# Patient Record
Sex: Female | Born: 1935
Health system: Southern US, Community
[De-identification: ages and names within clinical notes are randomized; demographics above are authoritative.]

## PROBLEM LIST (undated history)

## (undated) DIAGNOSIS — M5416 Radiculopathy, lumbar region: Secondary | ICD-10-CM

## (undated) DIAGNOSIS — H8110 Benign paroxysmal vertigo, unspecified ear: Secondary | ICD-10-CM

## (undated) DIAGNOSIS — E785 Hyperlipidemia, unspecified: Secondary | ICD-10-CM

## (undated) DIAGNOSIS — I341 Nonrheumatic mitral (valve) prolapse: Secondary | ICD-10-CM

## (undated) DIAGNOSIS — E039 Hypothyroidism, unspecified: Secondary | ICD-10-CM

## (undated) DIAGNOSIS — Z8669 Personal history of other diseases of the nervous system and sense organs: Secondary | ICD-10-CM

## (undated) DIAGNOSIS — S83006A Unspecified dislocation of unspecified patella, initial encounter: Secondary | ICD-10-CM

## (undated) DIAGNOSIS — S62109A Fracture of unspecified carpal bone, unspecified wrist, initial encounter for closed fracture: Secondary | ICD-10-CM

## (undated) DIAGNOSIS — K5790 Diverticulosis of intestine, part unspecified, without perforation or abscess without bleeding: Secondary | ICD-10-CM

## (undated) DIAGNOSIS — K219 Gastro-esophageal reflux disease without esophagitis: Secondary | ICD-10-CM

## (undated) DIAGNOSIS — Z950 Presence of cardiac pacemaker: Secondary | ICD-10-CM

## (undated) DIAGNOSIS — M5136 Other intervertebral disc degeneration, lumbar region: Secondary | ICD-10-CM

## (undated) DIAGNOSIS — M810 Age-related osteoporosis without current pathological fracture: Secondary | ICD-10-CM

## (undated) DIAGNOSIS — B029 Zoster without complications: Secondary | ICD-10-CM

## (undated) DIAGNOSIS — S92909A Unspecified fracture of unspecified foot, initial encounter for closed fracture: Secondary | ICD-10-CM

## (undated) DIAGNOSIS — I495 Sick sinus syndrome: Secondary | ICD-10-CM

## (undated) DIAGNOSIS — H269 Unspecified cataract: Secondary | ICD-10-CM

## (undated) DIAGNOSIS — M51369 Other intervertebral disc degeneration, lumbar region without mention of lumbar back pain or lower extremity pain: Secondary | ICD-10-CM

## (undated) DIAGNOSIS — K649 Unspecified hemorrhoids: Secondary | ICD-10-CM

## (undated) HISTORY — DX: Personal history of other diseases of the nervous system and sense organs: Z86.69

## (undated) HISTORY — DX: Benign paroxysmal vertigo, unspecified ear: H81.10

## (undated) HISTORY — DX: Zoster without complications: B02.9

## (undated) HISTORY — DX: Hypothyroidism, unspecified: E03.9

## (undated) HISTORY — DX: Unspecified fracture of unspecified foot, initial encounter for closed fracture: S92.909A

## (undated) HISTORY — PX: TOTAL HIP ARTHROPLASTY: SHX124

## (undated) HISTORY — DX: Gastro-esophageal reflux disease without esophagitis: K21.9

## (undated) HISTORY — DX: Hyperlipidemia, unspecified: E78.5

## (undated) HISTORY — PX: PACEMAKER INSERTION: SHX728

## (undated) HISTORY — DX: Unspecified hemorrhoids: K64.9

## (undated) HISTORY — DX: Unspecified cataract: H26.9

## (undated) HISTORY — DX: Age-related osteoporosis without current pathological fracture: M81.0

## (undated) HISTORY — DX: Sick sinus syndrome: I49.5

## (undated) HISTORY — DX: Diverticulosis of intestine, part unspecified, without perforation or abscess without bleeding: K57.90

## (undated) HISTORY — PX: HEMORROIDECTOMY: SUR656

## (undated) HISTORY — DX: Nonrheumatic mitral (valve) prolapse: I34.1

## (undated) HISTORY — DX: Fracture of unspecified carpal bone, unspecified wrist, initial encounter for closed fracture: S62.109A

## (undated) HISTORY — DX: Presence of cardiac pacemaker: Z95.0

---

## 1997-07-28 ENCOUNTER — Other Ambulatory Visit: Admission: RE | Admit: 1997-07-28 | Discharge: 1997-07-28 | Payer: Self-pay | Admitting: Cardiology

## 1997-07-30 ENCOUNTER — Ambulatory Visit (HOSPITAL_COMMUNITY): Admission: RE | Admit: 1997-07-30 | Discharge: 1997-07-31 | Payer: Self-pay | Admitting: Cardiology

## 1999-01-13 ENCOUNTER — Other Ambulatory Visit: Admission: RE | Admit: 1999-01-13 | Discharge: 1999-01-13 | Payer: Self-pay | Admitting: Cardiology

## 1999-11-27 ENCOUNTER — Emergency Department (HOSPITAL_COMMUNITY): Admission: EM | Admit: 1999-11-27 | Discharge: 1999-11-27 | Payer: Self-pay | Admitting: Emergency Medicine

## 2000-01-23 ENCOUNTER — Other Ambulatory Visit: Admission: RE | Admit: 2000-01-23 | Discharge: 2000-01-23 | Payer: Self-pay | Admitting: Internal Medicine

## 2002-04-09 ENCOUNTER — Encounter: Payer: Self-pay | Admitting: Orthopedic Surgery

## 2002-04-15 ENCOUNTER — Encounter: Payer: Self-pay | Admitting: Orthopedic Surgery

## 2002-04-15 ENCOUNTER — Inpatient Hospital Stay (HOSPITAL_COMMUNITY): Admission: RE | Admit: 2002-04-15 | Discharge: 2002-04-18 | Payer: Self-pay | Admitting: Orthopedic Surgery

## 2003-09-17 ENCOUNTER — Ambulatory Visit (HOSPITAL_COMMUNITY): Admission: RE | Admit: 2003-09-17 | Discharge: 2003-09-18 | Payer: Self-pay | Admitting: Cardiology

## 2003-09-19 ENCOUNTER — Emergency Department (HOSPITAL_COMMUNITY): Admission: EM | Admit: 2003-09-19 | Discharge: 2003-09-19 | Payer: Self-pay | Admitting: *Deleted

## 2004-01-26 ENCOUNTER — Ambulatory Visit (HOSPITAL_COMMUNITY): Admission: RE | Admit: 2004-01-26 | Discharge: 2004-01-26 | Payer: Self-pay | Admitting: Internal Medicine

## 2004-11-13 ENCOUNTER — Ambulatory Visit (HOSPITAL_COMMUNITY): Admission: RE | Admit: 2004-11-13 | Discharge: 2004-11-13 | Payer: Self-pay | Admitting: Internal Medicine

## 2004-11-13 IMAGING — CT CT CHEST W/ CM
2 of 5 series · 15 of 36 positions shown, 18 images · IV contrast (omnipaque)
Comparison: none

CLINICAL DATA: History of lung nodules.  
CHEST CT WITH CONTRAST:
TECHNIQUE: Multidetector CT imaging of the chest was performed following the standard protocol during bolus administration of intravenous contrast.
Contrast:  100 cc Omnipaque 300

[Series 4: thin chest 2.0 b40f st · axial · 0.56mm/px · z∈[-327,-38]mm · 12 of 456 slices shown, 15 images]
[im 22/456  mediastinal]
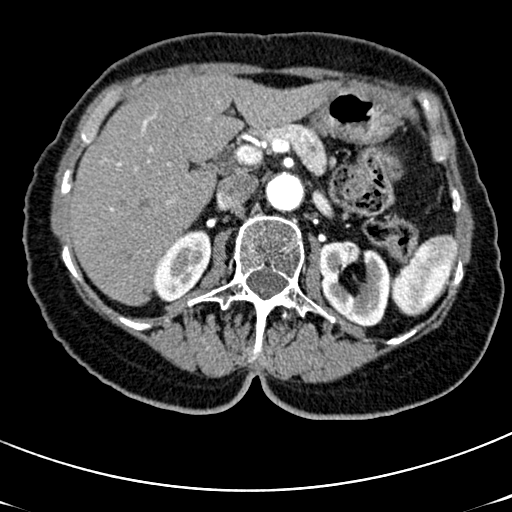
[im 22/456  lung]
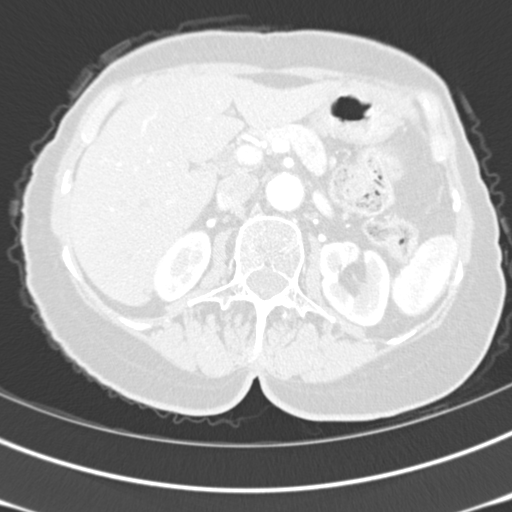
[im 66/456  lung]
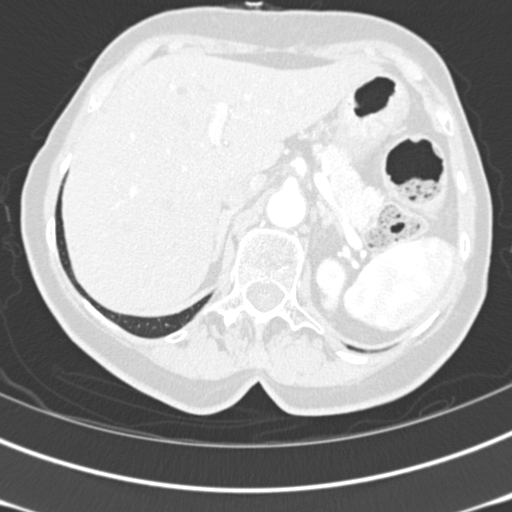
[im 109/456  lung]
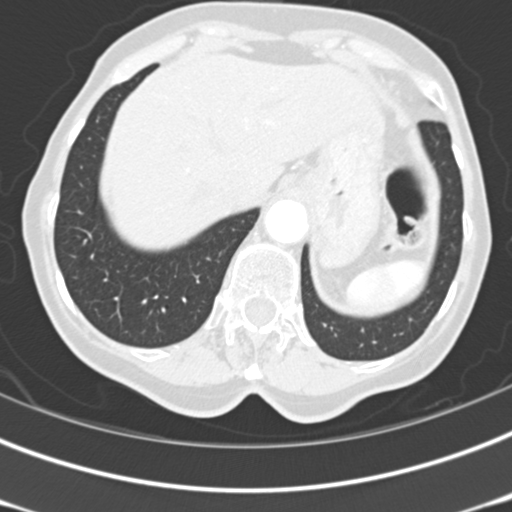
[im 131/456  lung]
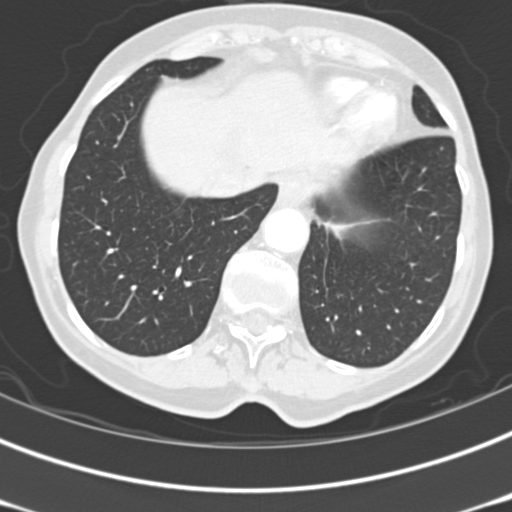
[im 174/456  mediastinal]
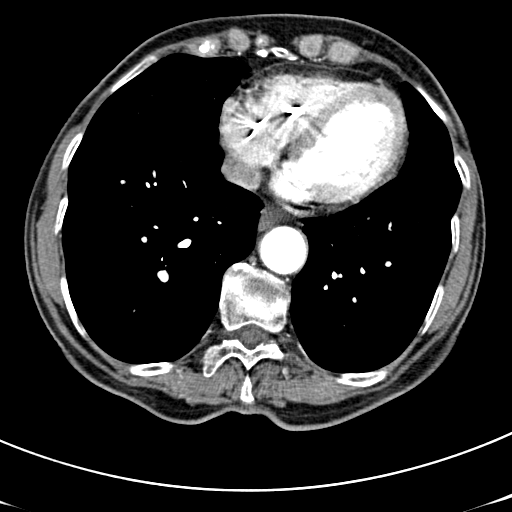
[im 174/456  lung]
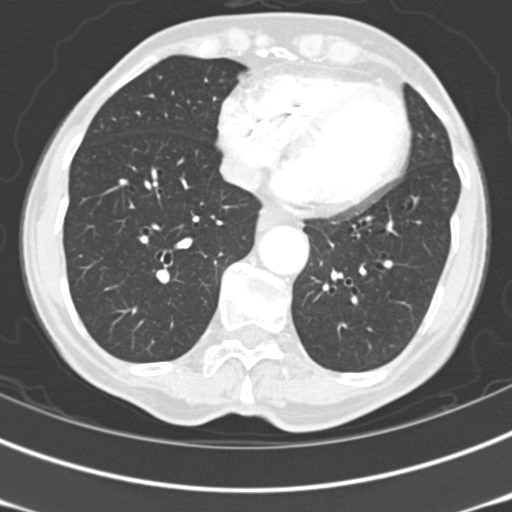
[im 217/456  lung]
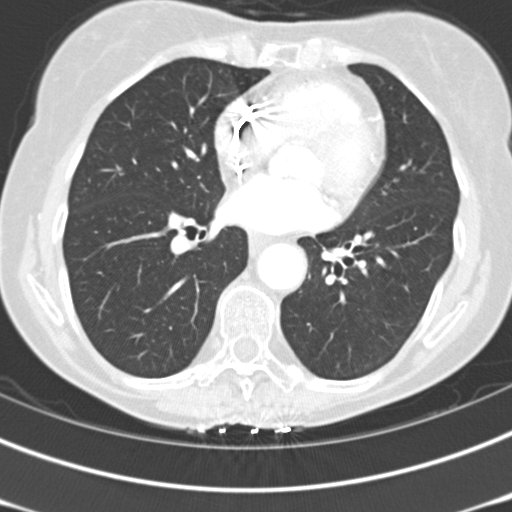
[im 239/456  lung]
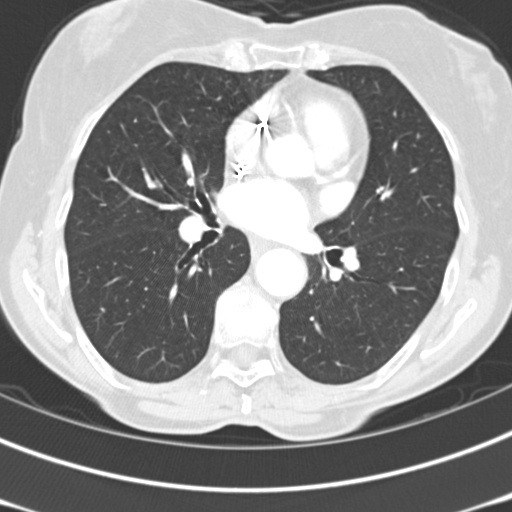
[im 282/456  lung]
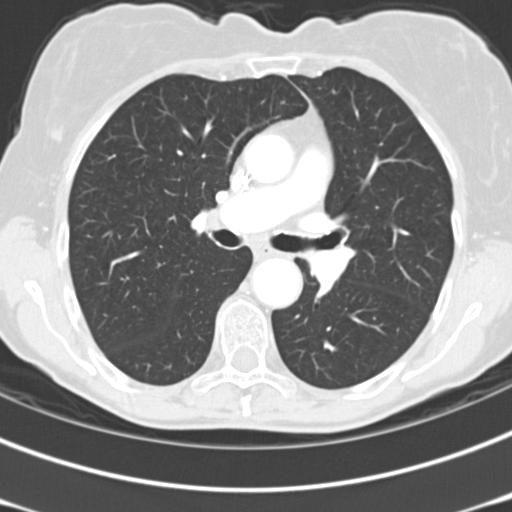
[im 326/456  mediastinal]
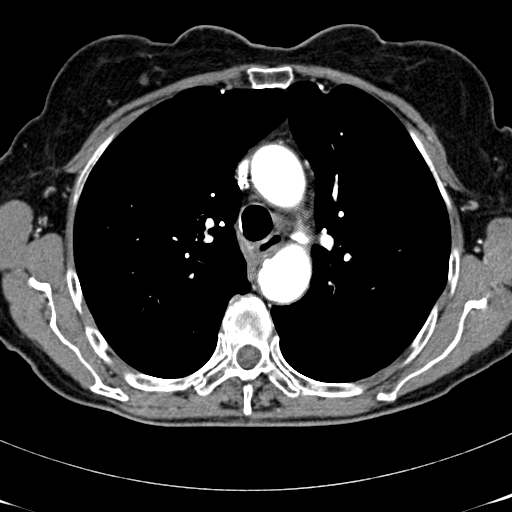
[im 326/456  lung]
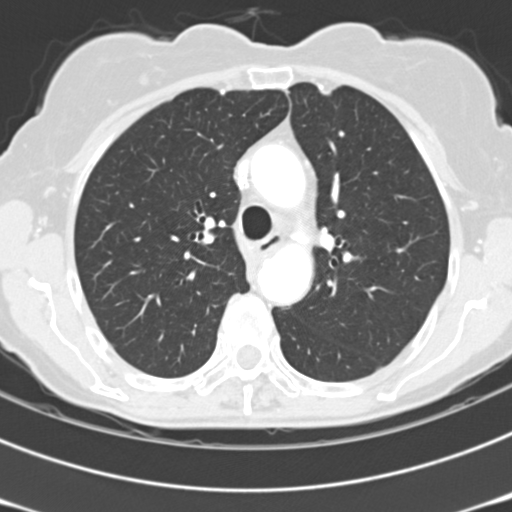
[im 347/456  lung]
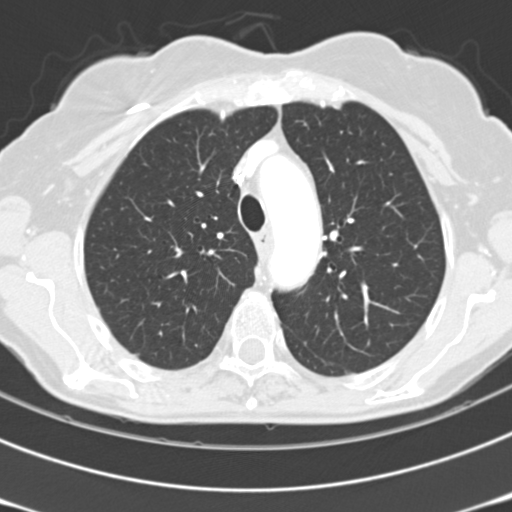
[im 391/456  lung]
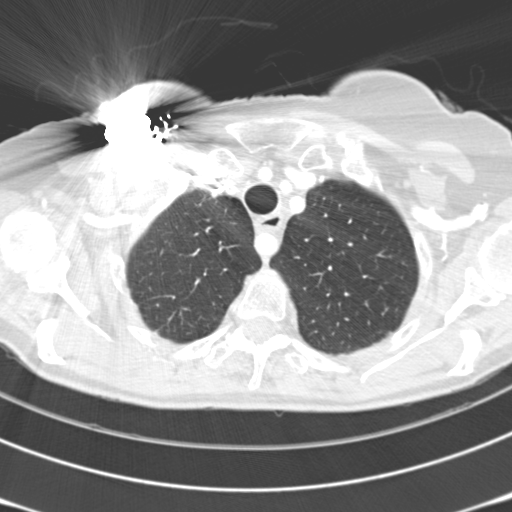
[im 434/456  lung]
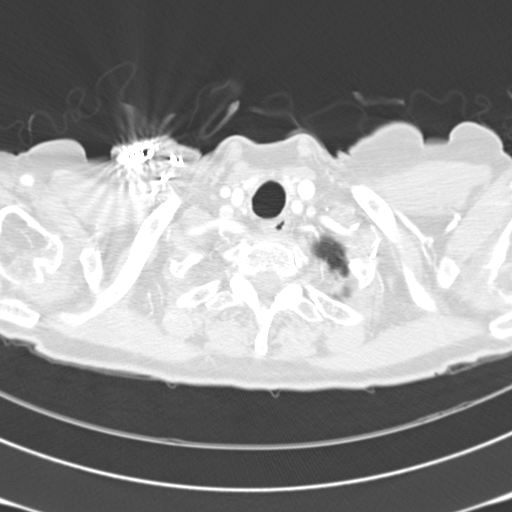

[Series 602: coronal · coronal · 0.62mm/px · 3 of 39 slices shown]
[im 8/39  lung]
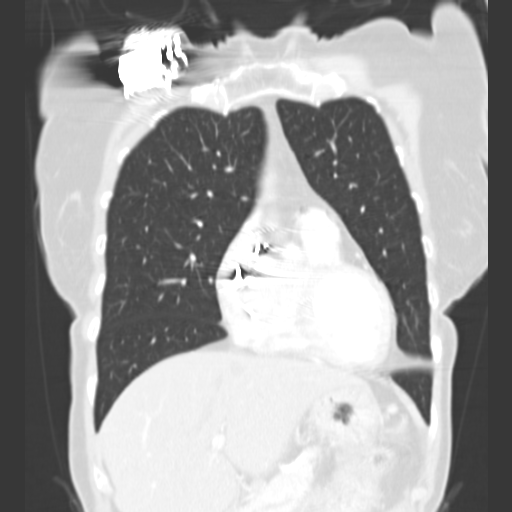
[im 16/39  lung]
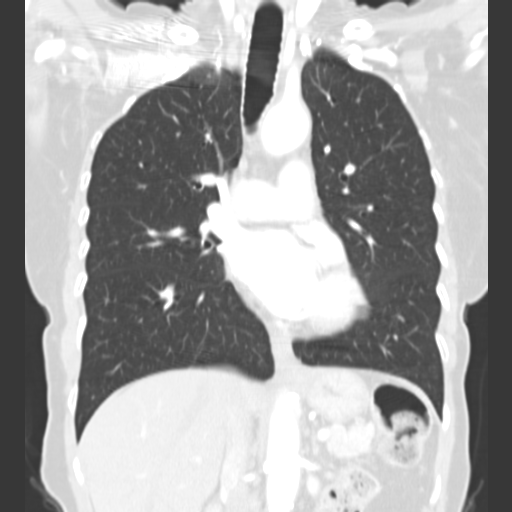
[im 23/39  lung]
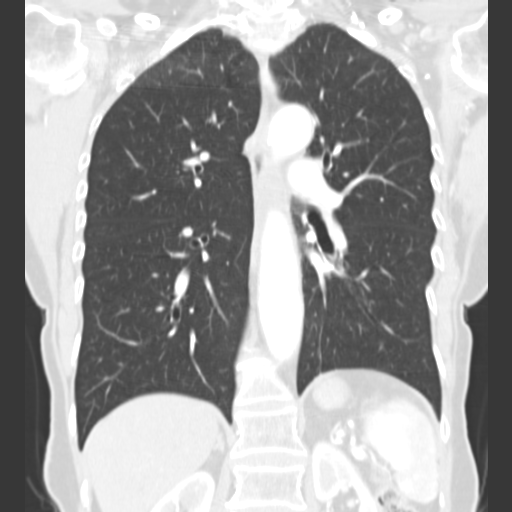

[15 of 36 positions shown; findings below may reference images not displayed]

FINDINGS: Again appreciated is left aortic arch with aberrant right subclavian artery coursing posterior to the esophagus.  Three lead pacemaker is again noted.  Again appreciated is an approximately 6.9 mm nodule in the apicoposterior segment of the right upper lobe. 
Stable small hepatic cysts medial segment left hepatic lobe.
IMPRESSION: Stable small noncalcified right upper lobe nodule.  Recommend continued followup to document stability over two years.  Consider non-IV contrast CT chest scan in six months.

## 2005-07-03 ENCOUNTER — Encounter: Admission: RE | Admit: 2005-07-03 | Discharge: 2005-07-03 | Payer: Self-pay | Admitting: Orthopedic Surgery

## 2005-07-10 HISTORY — PX: CARDIOVASCULAR STRESS TEST: SHX262

## 2005-07-12 ENCOUNTER — Inpatient Hospital Stay (HOSPITAL_COMMUNITY): Admission: RE | Admit: 2005-07-12 | Discharge: 2005-07-13 | Payer: Self-pay | Admitting: Orthopedic Surgery

## 2006-04-12 ENCOUNTER — Ambulatory Visit (HOSPITAL_COMMUNITY): Admission: RE | Admit: 2006-04-12 | Discharge: 2006-04-12 | Payer: Self-pay | Admitting: Internal Medicine

## 2007-06-17 ENCOUNTER — Encounter: Admission: RE | Admit: 2007-06-17 | Discharge: 2007-06-17 | Payer: Self-pay | Admitting: Otolaryngology

## 2007-10-22 ENCOUNTER — Ambulatory Visit (HOSPITAL_COMMUNITY): Admission: RE | Admit: 2007-10-22 | Discharge: 2007-10-22 | Payer: Self-pay | Admitting: Internal Medicine

## 2008-09-27 ENCOUNTER — Encounter: Admission: RE | Admit: 2008-09-27 | Discharge: 2008-09-27 | Payer: Self-pay | Admitting: Internal Medicine

## 2008-11-17 ENCOUNTER — Ambulatory Visit (HOSPITAL_COMMUNITY): Admission: RE | Admit: 2008-11-17 | Discharge: 2008-11-17 | Payer: Self-pay | Admitting: Internal Medicine

## 2008-11-25 HISTORY — PX: US ECHOCARDIOGRAPHY: HXRAD669

## 2009-01-15 DIAGNOSIS — S83006A Unspecified dislocation of unspecified patella, initial encounter: Secondary | ICD-10-CM

## 2009-01-15 HISTORY — DX: Unspecified dislocation of unspecified patella, initial encounter: S83.006A

## 2009-03-29 DIAGNOSIS — K219 Gastro-esophageal reflux disease without esophagitis: Secondary | ICD-10-CM | POA: Insufficient documentation

## 2009-03-29 DIAGNOSIS — M810 Age-related osteoporosis without current pathological fracture: Secondary | ICD-10-CM | POA: Insufficient documentation

## 2009-03-29 DIAGNOSIS — I059 Rheumatic mitral valve disease, unspecified: Secondary | ICD-10-CM | POA: Insufficient documentation

## 2009-03-29 DIAGNOSIS — G43909 Migraine, unspecified, not intractable, without status migrainosus: Secondary | ICD-10-CM | POA: Insufficient documentation

## 2009-03-29 DIAGNOSIS — M1610 Unilateral primary osteoarthritis, unspecified hip: Secondary | ICD-10-CM | POA: Insufficient documentation

## 2009-05-19 DIAGNOSIS — I700 Atherosclerosis of aorta: Secondary | ICD-10-CM | POA: Insufficient documentation

## 2009-09-11 ENCOUNTER — Ambulatory Visit: Payer: Self-pay | Admitting: Cardiology

## 2009-11-08 ENCOUNTER — Encounter: Admission: RE | Admit: 2009-11-08 | Discharge: 2009-11-08 | Payer: Self-pay | Admitting: Orthopedic Surgery

## 2009-11-19 ENCOUNTER — Encounter: Payer: Self-pay | Admitting: Internal Medicine

## 2009-12-08 ENCOUNTER — Ambulatory Visit: Payer: Self-pay | Admitting: Internal Medicine

## 2009-12-28 ENCOUNTER — Encounter: Payer: Self-pay | Admitting: Internal Medicine

## 2010-02-16 NOTE — Letter (Signed)
Summary: Remote Device Check  [employer], Main Office  [address]   [place], [place] [address]   Phone: [phone]  Fax: [phone]     December 28, 2009 MRN: [id number]   [facility] [address] [place], [place]  [address]   Dear Ms. Lumbert,   Your remote transmission was recieved and reviewed by your physician.  All diagnostics were within normal limits for you.   ___X___Your next office visit is scheduled for:   02-24-10 AT 910 with Dr [name].     Sincerely,  Vella Kohler

## 2010-02-16 NOTE — Miscellaneous (Signed)
Summary: Device preload  Clinical Lists Changes  Observations: Added new observation of PPM INDICATN: Sick sinus syndrome (11/19/2009 13:18) Added new observation of MAGNET RTE: BOL 85 ERI 65 (11/19/2009 13:18) Added new observation of PPMLEADSTAT2: active (11/19/2009 13:18) Added new observation of PPMLEADSER2: 034742  (11/19/2009 13:18) Added new observation of PPMLEADMOD2: 4470  (11/19/2009 13:18) Added new observation of PPMLEADDOI2: 07/24/1997  (11/19/2009 13:18) Added new observation of PPMLEADLOC2: RV  (11/19/2009 13:18) Added new observation of PPMLEADSTAT1: active  (11/19/2009 13:18) Added new observation of PPMLEADSER1: 595638  (11/19/2009 13:18) Added new observation of PPMLEADMOD1: 4469  (11/19/2009 13:18) Added new observation of PPMLEADDOI1: 09/17/2003  (11/19/2009 13:18) Added new observation of PPMLEADLOC1: RA  (11/19/2009 13:18) Added new observation of PPM DOI: 09/17/2003  (11/19/2009 13:18) Added new observation of PPM SERL#: [id number]  (11/19/2009 13:18) Added new observation of PPM MODL#: [id number]  (11/19/2009 60:63) Added new observation of PACEMAKERMFG: Medtronic  (11/19/2009 13:18) Added new observation of PPM IMP MD: Duffy Rhody Tennant,MD  (11/19/2009 13:18) Added new observation of PPM REFER MD: Rolla Plate  (11/19/2009 13:18) Added new observation of PACEMAKER MD: Hillis Range, MD  (11/19/2009 13:18)      PPM Specifications Following MD:  Hillis Range, MD     Referring MD:  Rolla Plate PPM Vendor:  Medtronic     PPM Model Number:  [id number]     PPM Serial Number:  [id number] PPM DOI:  09/17/2003     PPM Implanting MD:  Rolla Plate  Lead 1    Location: RA     DOI: 09/17/2003     Model #: [id number]     Serial #: [id number]     Status: active Lead 2    Location: RV     DOI: 07/24/1997     Model #: 4470     Serial #: [id number]     Status: active  Magnet Response Rate:  BOL 85 ERI 65  Indications:  Sick sinus syndrome

## 2010-02-16 NOTE — Cardiovascular Report (Signed)
Summary: Office Visit Remote   Office Visit Remote   Imported By: Roderic Ovens 12/29/2009 14:09:11  _____________________________________________________________________  External Attachment:    Type:   Image     Comment:   External Document

## 2010-02-24 ENCOUNTER — Encounter: Payer: Self-pay | Admitting: Internal Medicine

## 2010-02-24 ENCOUNTER — Encounter (INDEPENDENT_AMBULATORY_CARE_PROVIDER_SITE_OTHER): Payer: 59 | Admitting: Internal Medicine

## 2010-02-24 DIAGNOSIS — I495 Sick sinus syndrome: Secondary | ICD-10-CM

## 2010-03-02 NOTE — Assessment & Plan Note (Signed)
Summary: pacer check/mdt/gso card pt moved from bumplist/mt/kwb   Visit Type:  Initial Consult Primary Provider:  Rodrigo Ran MD   History of Present Illness: [name] is an 77 yo WF with a h/o sinus node dysfunction s/p PPM 1990s (in [place]) for symptomatic bradycardia.  She had atrial lead revision and generator change by Dr Deborah Chalk in 2005.  She has done very well from a pacemaker standpoint.  She denies CP, SOB, palpitations, dizziness, presyncope, or syncope.  Unfortunately, she tripped over a curb 10/11 and fractured several bones in her R foot and L knee cap.  She required extended stay at [facility] center.  She feels that she continues to make progress.  She is otherwise without complaint today.  Current Medications (verified): 1)  Synthroid 75 Mcg Tabs (Levothyroxine Sodium) .... Once Daily 2)  Aspirin 81 Mg Tbec (Aspirin) .... Take One Tablet By Mouth Daily 3)  Multivitamins   Tabs (Multiple Vitamin) .... Once Daily 4)  Calcium 600 600 Mg Tabs (Calcium Carbonate) .... 2 Daily 5)  Vitamin D 1000 Unit Tabs (Cholecalciferol) .... Once Daily 6)  Pravastatin Sodium 10 Mg Tabs (Pravastatin Sodium) .... Take One Tablet By Mouth Daily At Bedtime 7)  Fish Oil   Oil (Fish Oil) .... Once Daily  Allergies (verified): No Known Drug Allergies  Past History:  Past Medical History: sinus node dysfunction s/p PPM Hypothyroidism MVP HL osteoporosis  Past Surgical History: PPM 1990s in [place] RA lead revision and gen change (MDT) by Dr Deborah Chalk 2005  Family History: DM  Social History: Pt lives in [place] with spouse.  Denies TED.  Works occasional as a Lawyer  Review of Systems       All systems are reviewed and negative except as listed in the HPI.   Vital Signs:  Patient profile:   77 year old female Height:      65 inches Weight:      135 pounds BMI:     22.55 Pulse rate:   76 / minute BP sitting:   110 / 60  (left arm)  Vitals Entered By: Laurance Flatten  CMA (February 24, 2010 9:32 AM)  Physical Exam  General:  Well developed, well nourished, in no acute distress. Head:  normocephalic and atraumatic Eyes:  PERRLA/EOM intact; conjunctiva and lids normal. Mouth:  Teeth, gums and palate normal. Oral mucosa normal. Neck:  supple Chest Wall:  pacemaker pocket is well healed Lungs:  Clear bilaterally to auscultation and percussion. Heart:  RRR, no m/r/g Abdomen:  Bowel sounds positive; abdomen soft and non-tender without masses, organomegaly, or hernias noted. No hepatosplenomegaly. Msk:  trace edema R ankle walks with a cane Extremities:  No clubbing or cyanosis. Neurologic:  Alert and oriented x 3. Skin:  Intact without lesions or rashes.   PPM Specifications Following MD:  Hillis Range, MD     Referring MD:  Rolla Plate PPM Vendor:  Medtronic     PPM Model Number:  [id number]     PPM Serial Number:  [id number] PPM DOI:  09/17/2003     PPM Implanting MD:  Rolla Plate  Lead 1    Location: RA     DOI: 09/17/2003     Model #: 4469     Serial #: [id number]     Status: active Lead 2    Location: RV     DOI: 07/24/1997     Model #: 4470     Serial #: [id number]  Status: active  Magnet Response Rate:  BOL 85 ERI 65  Indications:  Sick sinus syndrome  MD Comments:  see scanned report  Impression & Recommendations:  Problem # 1:  SICK SINUS SYNDROME (ICD-427.81) normal pacemaker function no changes today [facility] every 3 months return in 12 months  Patient Instructions: 1)  Your physician wants you to follow-up in:  12 months with Dr Jacquiline Doe will receive a reminder letter in the mail two months in advance. If you don't receive a letter, please call our office to schedule the follow-up appointment. 2)  [facility] transmission on 05/25/2010 3)  Your physician recommends that you continue on your current medications as directed. Please refer to the Current Medication list given to you today.

## 2010-03-08 ENCOUNTER — Ambulatory Visit (INDEPENDENT_AMBULATORY_CARE_PROVIDER_SITE_OTHER): Payer: 59 | Admitting: Cardiology

## 2010-03-08 DIAGNOSIS — I059 Rheumatic mitral valve disease, unspecified: Secondary | ICD-10-CM

## 2010-03-14 NOTE — Cardiovascular Report (Signed)
Summary: Office Visit   Office Visit   Imported By: Roderic Ovens 03/08/2010 15:43:37  _____________________________________________________________________  External Attachment:    Type:   Image     Comment:   External Document

## 2010-05-12 ENCOUNTER — Ambulatory Visit (HOSPITAL_COMMUNITY): Payer: Medicare Other | Attending: Internal Medicine

## 2010-05-12 DIAGNOSIS — M810 Age-related osteoporosis without current pathological fracture: Secondary | ICD-10-CM | POA: Insufficient documentation

## 2010-05-25 ENCOUNTER — Encounter: Payer: 59 | Admitting: *Deleted

## 2010-05-31 ENCOUNTER — Encounter: Payer: Self-pay | Admitting: *Deleted

## 2010-06-02 NOTE — H&P (Signed)
NAME:  Robin Atkins, Robin Atkins                         ACCOUNT NO.:  [id number]   MEDICAL RECORD NO.:  [id number]                   PATIENT TYPE:  OIB   LOCATION:                                       FACILITY:  [facility]   PHYSICIAN:  Colleen Can. Deborah Chalk, M.D.            DATE OF BIRTH:  1924-03-29   DATE OF ADMISSION:  09/17/2003  DATE OF DISCHARGE:                                HISTORY & PHYSICAL   CHIEF COMPLAINT:  None.   HISTORY OF PRESENT ILLNESS:  Robin Atkins is a very pleasant 77 year old  white female who presents for elective generator replacement.  She has had  dual chamber pacemaker in place since October 1998.  During a recent  transtelephonic pacemaker check, she was noted to have elective replacement  indicators.  She was brought into the office on September 14, 2003 and at that  time her pacemaker interrogation did show elective replacement indicators  being reached.  She now presents for elective generator replacement.  Clinically, she has done well.  She has been asymptomatic.   PAST MEDICAL HISTORY:  1.  Hyperlipidemia, currently off therapy.  2.  Past history of bradycardia with a subsequent DDD pacemaker pacer placed      in October of 1998.  She did have subsequent revision as well due to      lead fracture in 1999.  3.  Past history of fractured left forearm.  4.  History of vertebral fracture.  5.  Status post remote hemorrhoidectomy.  6.  Remote history of tonsillectomy.  7.  Past history of headaches.   ALLERGIES:  Questionably PREDNISONE.   CURRENT MEDICATIONS:  1.  Calcium q.d.  2.  Fosamax 1 weekly.  3.  Multivitamin q.d.  4.  Baby aspirin.  5.  Synthroid 0.075 q.d.   FAMILY HISTORY:  father died at age 75.  Mother has had a past history of  myocardial infarction and lived up into her 106s.   SOCIAL HISTORY:  She is a retired Engineer, site.  There is no alcohol or  tobacco products.  She lives at home with her husband.   REVIEW OF SYMPTOMS:  Basically as  noted above.  She has had no chest pain,  lightheadedness and no dizziness.  She does remain active.   PHYSICAL EXAMINATION:  GENERAL:  She is a very pleasant white female who  appears younger than her stated age.  VITAL SIGNS:  Blood pressure is 120/70 sitting, 110/80 standing.  Heart rate  is 64.  Weight is 137 pounds.  SKIN:  Warm and dry.  Color is unremarkable.  LUNGS:  Basically clear.  HEART:  Regular rhythm.  Pacemaker is in the right pectoral region and is  satisfactory.  ABDOMEN:  Negative.  EXTREMITIES:  Negative.  NEUROLOGICAL:  Intact.  There is no gross focal deficits.   LABORATORY DATA:  Pertinent labs are pending.   IMPRESSION:  1.  End of life pulse generator.  2.  Pace history of pacemaker implantation for profound bradycardia dating      back to 1998.  3.  Hypothyroidism.  4.  Hyperlipidemia.   PLAN:  We will proceed on with elective generator placement.  The procedure  has been reviewed in full detail and she is willing to proceed on Friday,  September 17, 2003.      Sharlee Blew, N.P.                     Colleen Can. Deborah Chalk, M.D.    LC/MEDQ  D:  09/15/2003  T:  09/15/2003  Job:  [id number]   cc:   Loraine Leriche A. Waynard Edwards, M.D.  [address]  [place]  [place] [address]  Fax: [phone]   Colleen Can. Deborah Chalk, M.D.  Fax: [phone]

## 2010-06-02 NOTE — Op Note (Signed)
NAME:  Robin Atkins, Robin Atkins                         ACCOUNT NO.:  [id number]   MEDICAL RECORD NO.:  [id number]                   PATIENT TYPE:  INP   LOCATION:  2899                                 FACILITY:  [facility]   PHYSICIAN:  Elana Alm. Thurston Hole, M.D.              DATE OF BIRTH:  January 11, 1925   DATE OF PROCEDURE:  04/15/2002  DATE OF DISCHARGE:                                 OPERATIVE REPORT   SURGEON:  Elana Alm. Thurston Hole, M.D.   ASSISTANT:  Julien Girt, P.A.   PREOPERATIVE DIAGNOSIS:  Right hip degenerative joint disease.   POSTOPERATIVE DIAGNOSIS:  Right hip degenerative joint disease.   PROCEDURE:  Right total hip replacement using [employer]onics Total Hip System  with acetabulum 54 mm PCL press-fit acetabulum with two locking screws and  10 degree polyethylene liner.  Femoral component #6 eon cemented femoral  component with +5 x 28 mm femoral head and #4 cement plug with 12 mm  centralizer.   ANESTHESIA:  General anesthesia.   OPERATIVE TIME:  One hours and 20 minutes.   ESTIMATED BLOOD LOSS:  300 mL.   COMPLICATIONS:  None.   DESCRIPTION OF PROCEDURE:  The patient was brought to the operating room on  April 15, 2002, placed on the operating table in the supine position.  After  being placed under general anesthesia, her right hip was examined under  anesthesia.  She had flexion to 90, extension to 0, internal and external  rotation of 30 degrees with no shortening of the right leg compared to the  left.  She had a Foley catheter placed under sterile conditions and received  Ancef 1 g IV preoperatively for prophylaxis.  She was then turned to the  right lateral up decubitus position and secured on the bed with a Mark  frame.  The right hip and leg was prepped using sterile DuraPrep and draped  using sterile technique.  Originally through a 15 cm posterior lateral  greater trochanteric incision, initial exposure was made.  Midline  subcutaneous tissues were incised in line  with skin incision.  The  iliotibial band and gluteus maximus fascia was incised longitudinally  revealing the underlying sciatic nerve which was carefully protected.  The  short external rotators of the hip and hip capsule were released off the  femoral neck insertion and tagged and then the hip dislocated posteriorly.  She was found to have grade III and IV chondromalacia and DJD of the femoral  head.  A femoral neck cut was made 2 cm above the lesser trochanter in the  appropriate amount of abduction and inclination and anteversion.  Carefully  retractors were placed around the acetabulum and degenerative labrum was  removed from the acetabulum.  The acetabulum itself was found to have grade  III and IV chondromalacia as well.  Sequential acetabular reamers were then  used to ream up to a 54 mm size and in  the appropriate amount of abduction  and anteversion and then a 54 mm trial was used.  This was found to give an  excellent fit. This was then removed and the actual 54 mm TSL cup was  hammered into position also with an excellent fit.  This was placed in the  appropriate amount of anteversion and abduction.  After this was done, then  two locking screws were placed, each 20 mm in length, one in the 11 o'clock  and one in the 1 o'clock position.  A 10 degree polyethylene liner was then  placed with a 10 degree lip in the posterior lateral position.  Attention  was then turned to the proximal femur which was exposed.  Sequential axial  reamers were used to ream up to a #6 size followed by broaches to a #6 size  and with a #6 broach trial in place, first a +0 followed by +5 femoral head  and neck was placed with the +5 femoral head and neck trial on.  The hip was  reduced, taken through a full range of motion, found to be stable and leg  length equal.  This trial was then removed.  The femoral canal was measured  for cement plug and a #4 was found to be the appropriate size and a #4   cement plug was placed.  The femoral canal was then jet lavaged, irrigated  with three liters of saline solution and then filled with cement and then  the #6 eon stem with a 12 mm distal tip was placed down the femoral and held  into position while excess cement was removed from around the edges.  After  the cement hardened, +5 x 28 mm femoral head was hammered onto the femoral  neck with an excellent Morse taper fit.  The hip reduced, taken through a  full range of motion, stable up to 70 degrees of internal rotation in both  neutral and 30 degrees of adduction and also stable in abduction and  external rotation.  Leg lengths were found to be equal.  At this point, it  was felt that all the components were of excellent size, fit, and stability.  The wound was further irrigated with antibiotic solution.  The hip capsule  and short external rotators of the hip were reattached to the femoral neck  insertion through two drill holes in the greater trochanter.  The iliotibial  band and gluteus maximus fascia was closed with #1 Ethibond suture,  subcutaneous tissues closed with 0 and 2-0 Vicryl, skin closed with skin  staples.  Sterile dressings were applied and a hip abduction pillow applied.  The patient turned supine, awakened, extubated, and taken to the recovery  room in stable condition.  Needle and sponge counts correct x2 at the end of  the case.  Leg lengths were found to be equal.  Pulses 2+ and equal.  Neurovascularly status normal.                                               Robert A. Thurston Hole, M.D.    RAW/MEDQ  D:  04/15/2002  T:  04/15/2002  Job:  [id number]

## 2010-06-02 NOTE — Op Note (Signed)
NAMEDWIGHT, BURDO               ACCOUNT NO.:  [id number]   MEDICAL RECORD NO.:  [id number]          PATIENT TYPE:  INP   LOCATION:  2550                         FACILITY:  [facility]   PHYSICIAN:  Doralee Albino. Carola Frost, M.D. DATE OF BIRTH:  03/17/1924   DATE OF PROCEDURE:  07/12/2005  DATE OF DISCHARGE:                                 OPERATIVE REPORT   PREOPERATIVE DIAGNOSIS:  Left femur fracture   POSTOPERATIVE DIAGNOSIS:  Left femur fracture.   PROCEDURE:  1.  Intramedullary nailing of the left femur using a statically locked 11.5      mm x 340 mm Smith-Nephew Meta nail.  2.  Removal of deep implant.   SURGEON:  Doralee Albino. Carola Frost, M.D.   ASSISTANT:  Aura Fey. Ireton, P.A.-C.   ANESTHESIA:  General.   COMPLICATIONS:  None.   ESTIMATED BLOOD LOSS:  150 mL   DISPOSITION:  PACU.   CONDITION:  Stable.   INDICATIONS FOR PROCEDURE:  Robin Atkins is a 77 year old female status  post left compression hip screw placement over 25 years ago. She developed a  history of thigh pain and multiple radiographs were obtained which were not  diagnostic but a bone scan did demonstrate fracture at the base of her  compression hip screw plate.  After a discussion of the risks and benefits  of surgery, she elected to undergo internal fixation of the fracture  understanding those risks to include infection, nerve injury, vessel injury,  nonunion, failure to completely alleviate symptoms and symptomatic hardware,  among other perioperative complications including heart attack, stroke and  DVT, PE.   DESCRIPTION OF PROCEDURE:  Robin Atkins was taken to the operating room.  After administration of preoperative antibiotic, the patient's old incision  was opened over a 4 cm area.  Dissection was carried through the tensor  which was split in line with the incision.  The vastus was then elevated  anteriorly and the screw heads identified.  There was significant bony  overgrowth that did require use of an  osteotome to remove it.  The plate was  encased in bone in some portions. We did leave the bone overlying the plate  as we felt this might add some stability after screw removal, but I chipped  away the bone right at the screw heads.  We were careful not to damage them.  We then engaged the screwdriver and were able to remove the screws without  complication.  The wound was copiously irrigated and packed.   Attention was then turned to the knee where a 2 cm incision over the  anteromedial aspect of patellar tendon was made.  The soft tissues were  spread, the paratenon incised, and the guidewire inserted into the  appropriate position in the middle of the trochlea at the superior edge.  It  was then advanced to a center/center position of the metaphysis, the  starting reamer used to establish a starting hole and a guide pin inserted  up to beneath the lag screw into the femoral head.  We did have to tap the  guide pin across the  sclerotic area where the previous screws had been. We  then sequentially reamed it to 12.5 and inserted 11.5 mm x 340 mm Smith-  Nephew Meta nail.  We placed one distal lock which was an anterolateral  oblique and one proximal anterior to posterior screw.  We then irrigated the  knee copiously and closed the incisions in a standard layered fashion with a  0 used for paratenon closure, a 2-0, and then staples for the skin.  Similarly, 0, 2-0, and staples for the skin on the lateral wound.  Final  images showed appropriate hardware placement without any evidence of  complications.   PROGNOSIS:  Robin Atkins should do well following placement of this nail.  She was able to sustain minimal soft tissue trauma during extraction of the  screws and we are hopeful that she will not have any symptoms related to the  distal locking screw given its location above the condyles.  She will be  weight bearing as tolerated.  She will be on Lovenox for DVT prophylaxis for  just  ten days and then transition to a baby aspirin for several weeks.  She  will have no range of motion restrictions.      Doralee Albino. Carola Frost, M.D.  Electronically Signed     MHH/MEDQ  D:  07/12/2005  T:  07/12/2005  Job:  [id number]

## 2010-06-02 NOTE — Cardiovascular Report (Signed)
NAME:  Robin Atkins, Robin Atkins                         ACCOUNT NO.:  [id number]   MEDICAL RECORD NO.:  [id number]                   PATIENT TYPE:  OIB   LOCATION:  4733                                 FACILITY:  [facility]   PHYSICIAN:  Colleen Can. Deborah Chalk, M.D.            DATE OF BIRTH:  Apr 28, 1924   DATE OF PROCEDURE:  09/17/2003  DATE OF DISCHARGE:                              CARDIAC CATHETERIZATION   PROCEDURE:  Pacemaker generator change out with atrial lead revision because  of end-of-life with a permanent pacemaker as well as lead fracture of the  atrial lead.   The right subclavicular area was prepped and draped.  The old pulse  generator was explanted.  It was noted that the atrial lead impedance was  extremely low and on fluoroscopy it was noted that she did have lead  fracture.  The atrial lead was St. Jude model [id number], serial number [id number].  At that point in time, we elected to proceed on with new atrial lead.  Using  7 French Cook introducer, the lead was introduced into the right atrium.  Initial puncture into the subclavian vein was made over top the first rib.  The atrial lead was a Guidant model [id number], serial number [id number].  This  was implanted into the right atrium.  The following thresholds were P waves  of 4.2 mV, capture was 0.6 V with current of 1.8 mA.  Pulse width of 0.5  msec.  Atrial lead impedance was measured at 429 ohms.  The existing  ventricular lead was Guidant model [id number], serial number [id number].  The  following thresholds were R waves 10.3 mV, ventricular capture was 0.6 V  with a current of 1.9 mA and pulse width of 0.5 msec.  Ventricular lead  impedance was 380 ohms.  At that point in time, the old atrial lead was  capped and sewn in the bottom of the pocket.  The leads were then connected  to a new Medtronic Enpulse pulse generator [id number] pulse generator, serial  number  [id number].  The unit was sutured in place.  The pocket was then  closed with 2-0  Vicryl and 5-0 Dexon and Steri-Strips were applied.  The  patient tolerated the procedure well.                                               Colleen Can. Deborah Chalk, M.D.    SNT/MEDQ  D:  09/17/2003  T:  09/18/2003  Job:  [id number]   cc:   Loraine Leriche A. Waynard Edwards, M.D.  [address]  [place]  [place] [address]  Fax: [phone]

## 2010-06-02 NOTE — Discharge Summary (Signed)
   NAME:  Robin Atkins, Robin Atkins                         ACCOUNT NO.:  [id number]   MEDICAL RECORD NO.:  [id number]                   PATIENT TYPE:  INP   LOCATION:  5005                                 FACILITY:  [facility]   PHYSICIAN:  Elana Alm. Thurston Hole, M.D.              DATE OF BIRTH:  May 10, 1924   DATE OF ADMISSION:  04/15/2002  DATE OF DISCHARGE:  04/18/2002                                 DISCHARGE SUMMARY   ADMITTING DIAGNOSES:  1. End-stage degenerative joint disease, right hip.  2. Hypothyroidism.   DISCHARGE DIAGNOSES:  1. End-stage degenerative joint disease, right hip.  2. Hypothyroidism.  3. Mitral valve.  4. Postoperative blood loss anemia.  5. Pacemaker placement.   HISTORY OF PRESENT ILLNESS:  The patient is a 77 year old white female with  a history of end-stage DJD of her right hip.  She has tried anti-  inflammatories and cortisone injections, all without benefit.  At this point  in time, she has pain at night and pain at rest, unrelieved by any  conservative treatment.  She understands the risks, benefits and possible  complications of a right total hip replacement and is without question.   PROCEDURES IN HOUSE:  On April 15, 2002, the patient underwent a right total  hip replacement by Dr. Elana Alm. Wainer.  She tolerated the procedure well.   HOSPITAL COURSE:  Postop day 1, hemoglobin was 7.1, surgical wound is well-  approximated.  She received 500 mL bolus of IV fluids as well as 2 units of  packed red blood cells with 20 mg of Lasix IV between units.  Postop day 2,  the patient was worried about some difficulties with hypotension; she had no  dizziness, no shortness of breath and chest pain.  T-max was 101.2.  Blood  pressure was 113/52.  She was up with physical therapy twice.  Her Foley was  discontinued and her dressing was changed.  Postop day 3, vital signs were  stable,  she was afebrile x24 hours, hemoglobin was 9.6, BMET was within  normal limits.  She was  discharged to home in stable condition.   FOLLOWUP:  Follow up with Dr. Thurston Hole on April 27, 2002.   ACTIVITY:  She is touch-down weightbearing, so she is weightbearing as  tolerated.   DISPOSITION:  She is being discharged to home in stable condition, on a  regular diet.   SPECIAL DISCHARGE INSTRUCTIONS:  She has been instructed to call with  temperature greater than 101, increased redness, increased drainage,  increased pain.     Kirstin Shepperson, P.A.                  Robert A. Thurston Hole, M.D.    KS/MEDQ  D:  06/10/2002  T:  06/11/2002  Job:  [id number]

## 2010-06-18 ENCOUNTER — Other Ambulatory Visit: Payer: Self-pay | Admitting: Internal Medicine

## 2010-06-19 ENCOUNTER — Ambulatory Visit (INDEPENDENT_AMBULATORY_CARE_PROVIDER_SITE_OTHER): Payer: 59 | Admitting: *Deleted

## 2010-06-19 DIAGNOSIS — I498 Other specified cardiac arrhythmias: Secondary | ICD-10-CM

## 2010-06-21 NOTE — Progress Notes (Signed)
PACER REMOTE 

## 2010-06-27 ENCOUNTER — Encounter: Payer: Self-pay | Admitting: *Deleted

## 2010-09-21 ENCOUNTER — Encounter: Payer: 59 | Admitting: *Deleted

## 2010-09-28 ENCOUNTER — Other Ambulatory Visit: Payer: Self-pay

## 2010-09-28 ENCOUNTER — Encounter: Payer: Self-pay | Admitting: *Deleted

## 2010-09-28 ENCOUNTER — Ambulatory Visit (INDEPENDENT_AMBULATORY_CARE_PROVIDER_SITE_OTHER): Payer: 59 | Admitting: *Deleted

## 2010-09-28 ENCOUNTER — Encounter: Payer: Self-pay | Admitting: Internal Medicine

## 2010-09-28 DIAGNOSIS — I495 Sick sinus syndrome: Secondary | ICD-10-CM

## 2010-10-01 LAB — REMOTE PACEMAKER DEVICE
AL IMPEDENCE PM: 707 Ohm
AL THRESHOLD: 0.625 V
ATRIAL PACING PM: 98
BATTERY VOLTAGE: 2.74 V
RV LEAD AMPLITUDE: 16 mv
RV LEAD IMPEDENCE PM: 358 Ohm
RV LEAD THRESHOLD: 0.875 V
VENTRICULAR PACING PM: 0

## 2010-10-10 ENCOUNTER — Encounter: Payer: Self-pay | Admitting: *Deleted

## 2010-10-13 NOTE — Progress Notes (Signed)
Pacer checked by remote 

## 2010-11-22 ENCOUNTER — Encounter: Payer: Self-pay | Admitting: Nurse Practitioner

## 2010-11-27 ENCOUNTER — Encounter: Payer: Self-pay | Admitting: Nurse Practitioner

## 2010-11-27 ENCOUNTER — Ambulatory Visit (INDEPENDENT_AMBULATORY_CARE_PROVIDER_SITE_OTHER): Payer: 59 | Admitting: Nurse Practitioner

## 2010-11-27 VITALS — BP 112/70 | HR 58 | Ht 65.0 in | Wt 135.1 lb

## 2010-11-27 DIAGNOSIS — I495 Sick sinus syndrome: Secondary | ICD-10-CM

## 2010-11-27 DIAGNOSIS — R0989 Other specified symptoms and signs involving the circulatory and respiratory systems: Secondary | ICD-10-CM

## 2010-11-27 NOTE — Assessment & Plan Note (Signed)
I have listened to her complaints. I have expressed to her my apologies. She understands that Dr. [name] will be following her pacemaker and cardiology needs. I explained that he and I both will be available to see her. No charge is given for today's encounter. She is due for her regular visit in February. Patient is agreeable to this plan and will call if any problems develop in the interim.

## 2010-11-27 NOTE — Patient Instructions (Addendum)
You will have Dr. Hillis Range as your cardiologist in one year.   His nurse is Dennis Bast. She is your contact person.

## 2010-11-27 NOTE — Progress Notes (Signed)
    Robin Atkins Date of Birth: Jul 03, 1924 Medical Record #[id number]  History of Present Illness: Robin Atkins is seen today for a follow up visit. She is a former patient of Dr. Ronnald Nian. She is seen for Dr. [name]. She has a pacemaker in place for sick sinus syndrome. Other problems include hyperlipidemia, osteoporosis and thyroid disorder. She continues to be followed by Dr. Waynard Edwards for her primary care.   She is only here today to find out who her cardiologist will now be. She is very upset that this information could not be relayed over the phone. She was apparently told that she had to come in to find out. She is upset that her insurance will be billed. She did not want to discuss any other issues.   Current Outpatient Prescriptions on File Prior to Visit  Medication Sig Dispense Refill  . aspirin 81 MG tablet Take 81 mg by mouth daily.        . calcium carbonate (OS-CAL) 600 MG TABS Take 600 mg by mouth 2 (two) times daily with a meal.        . cholecalciferol (VITAMIN D) 1000 UNITS tablet Take 1,000 Units by mouth daily.        . fish oil-omega-3 fatty acids 1000 MG capsule Take 2 g by mouth daily.        . Multiple Vitamin (MULTIVITAMIN) tablet Take 1 tablet by mouth daily.        . pravastatin (PRAVACHOL) 10 MG tablet Take 10 mg by mouth daily.       Marland Kitchen DISCONTD: levothyroxine (SYNTHROID, LEVOTHROID) 75 MCG tablet Take 75 mcg by mouth daily.          Allergies  Allergen Reactions  . Prednisone     Past Medical History  Diagnosis Date  . MVP (mitral valve prolapse)   . Hyperlipidemia   . OP (osteoporosis)   . SSS (sick sinus syndrome)     s/p PTVP  . Hypothyroidism   . Broken foot     Broken right foot and broken left knee in 2011  . S/P cardiac pacemaker procedure     Original implant 1998 with generator change in 2005. She has had prior lead revision.     Past Surgical History  Procedure Date  . Hip surgery   . Pacemaker insertion 1998/2005    TRANSVENOUS PACEMAKER    . US echocardiography 11/25/2008    EF 55-60%; mild to moderate AI  . Cardiovascular stress test 07/10/2005    EF 78%, NO ISCHEMIA    History  Smoking status  . Never Smoker   Smokeless tobacco  . Not on file    History  Alcohol Use No    Family History  Problem Relation Age of Onset  . Heart attack Mother     Physical Exam: BP 112/70  Pulse 58  Ht 5\' 5"  (1.651 m)  Wt 135 lb 1.9 oz (61.29 kg)  BMI 22.49 kg/m2 She was not otherwise examined today.  LABORATORY DATA: N/A   Assessment / Plan:

## 2010-12-21 ENCOUNTER — Ambulatory Visit (INDEPENDENT_AMBULATORY_CARE_PROVIDER_SITE_OTHER): Payer: 59 | Admitting: *Deleted

## 2010-12-21 DIAGNOSIS — I739 Peripheral vascular disease, unspecified: Secondary | ICD-10-CM

## 2010-12-28 ENCOUNTER — Ambulatory Visit (INDEPENDENT_AMBULATORY_CARE_PROVIDER_SITE_OTHER): Payer: 59 | Admitting: *Deleted

## 2010-12-28 DIAGNOSIS — I495 Sick sinus syndrome: Secondary | ICD-10-CM

## 2010-12-29 ENCOUNTER — Other Ambulatory Visit: Payer: Self-pay | Admitting: Internal Medicine

## 2010-12-29 ENCOUNTER — Encounter: Payer: Self-pay | Admitting: Internal Medicine

## 2010-12-31 LAB — REMOTE PACEMAKER DEVICE
AL IMPEDENCE PM: 747 Ohm
AL THRESHOLD: 0.625 V
ATRIAL PACING PM: 99
BAMS-0001: 175 {beats}/min
BATTERY VOLTAGE: 2.73 V
RV LEAD AMPLITUDE: 11.2 mv
RV LEAD IMPEDENCE PM: 371 Ohm
RV LEAD THRESHOLD: 0.75 V

## 2011-01-04 NOTE — Progress Notes (Signed)
Remote pacer check  

## 2011-01-11 ENCOUNTER — Encounter: Payer: Self-pay | Admitting: *Deleted

## 2011-03-09 ENCOUNTER — Encounter: Payer: Self-pay | Admitting: Internal Medicine

## 2011-03-09 ENCOUNTER — Ambulatory Visit (INDEPENDENT_AMBULATORY_CARE_PROVIDER_SITE_OTHER): Payer: 59 | Admitting: Internal Medicine

## 2011-03-09 DIAGNOSIS — I495 Sick sinus syndrome: Secondary | ICD-10-CM

## 2011-03-09 NOTE — Patient Instructions (Addendum)
Remote monitoring is used to monitor your Pacemaker of ICD from home. This monitoring reduces the number of office visits required to check your device to one time per year. It allows us to keep an eye on the functioning of your device to ensure it is working properly. You are scheduled for a device check from home on Jun 07, 2011. You may send your transmission at any time that day. If you have a wireless device, the transmission will be sent automatically. After your physician reviews your transmission, you will receive a postcard with your next transmission date.   Your physician wants you to follow-up in: 12 months with Dr Allred You will receive a reminder letter in the mail two months in advance. If you don't receive a letter, please call our office to schedule the follow-up appointment.   

## 2011-03-09 NOTE — Progress Notes (Signed)
PCP:  Ezequiel Kayser, MD, MD  The patient presents today for routine electrophysiology followup.  Since last being seen in our clinic, the patient reports doing very well.  She remains very active.  Today, she denies symptoms of palpitations, chest pain, shortness of breath, orthopnea, PND, lower extremity edema, dizziness, presyncope, syncope, or neurologic sequela.  The patient feels that she is tolerating medications without difficulties and is otherwise without complaint today.   Past Medical History  Diagnosis Date  . MVP (mitral valve prolapse)   . Hyperlipidemia   . OP (osteoporosis)   . SSS (sick sinus syndrome)     s/p PTVP  . Hypothyroidism   . Broken foot     Broken right foot and broken left knee in 2011  . S/P cardiac pacemaker procedure     Original implant 1998 with generator change in 2005. She has had prior lead revision.    Past Surgical History  Procedure Date  . Hip surgery   . Pacemaker insertion 1998/2005    TRANSVENOUS PACEMAKER  . US echocardiography 11/25/2008    EF 55-60%; mild to moderate AI  . Cardiovascular stress test 07/10/2005    EF 78%, NO ISCHEMIA    Current Outpatient Prescriptions  Medication Sig Dispense Refill  . aspirin 81 MG tablet Take 81 mg by mouth daily.        . calcium carbonate (OS-CAL) 600 MG TABS Take 600 mg by mouth 2 (two) times daily with a meal.        . cholecalciferol (VITAMIN D) 1000 UNITS tablet Take 1,000 Units by mouth daily.        . fish oil-omega-3 fatty acids 1000 MG capsule Take 2 g by mouth daily.        Marland Kitchen levothyroxine (SYNTHROID, LEVOTHROID) 50 MCG tablet Take 50 mcg by mouth daily.        . Multiple Vitamin (MULTIVITAMIN) tablet Take 1 tablet by mouth daily.        . pravastatin (PRAVACHOL) 10 MG tablet Take 10 mg by mouth daily.         Allergies  Allergen Reactions  . Prednisone     History   Social History  . Marital Status: Married    Spouse Name: N/A    Number of Children: N/A  . Years of  Education: N/A   Occupational History  . Not on file.   Social History Main Topics  . Smoking status: Never Smoker   . Smokeless tobacco: Not on file  . Alcohol Use: No  . Drug Use: No  . Sexually Active:    Other Topics Concern  . Not on file   Social History Narrative  . No narrative on file    Family History  Problem Relation Age of Onset  . Heart attack Mother     ROS-  All systems are reviewed and are negative except as outlined in the HPI above   Physical Exam: Filed Vitals:   03/09/11 1106  BP: 95/75  Pulse: 70  Height: 5\' 5"  (1.651 m)  Weight: 136 lb (61.689 kg)    GEN- The patient is well appearing, alert and oriented x 3 today.   Head- normocephalic, atraumatic Eyes-  Sclera clear, conjunctiva pink Ears- hearing intact Oropharynx- clear Neck- supple, no JVP Lymph- no cervical lymphadenopathy Lungs- Clear to ausculation bilaterally, normal work of breathing Chest- pacemaker pocket is well healed Heart- Regular rate and rhythm, no murmurs, rubs or gallops, PMI not laterally displaced GI-  soft, NT, ND, + BS Extremities- no clubbing, cyanosis, or edema  Pacemaker interrogation- reviewed in detail today,  See PACEART report  Assessment and Plan:

## 2011-03-09 NOTE — Assessment & Plan Note (Signed)
Normal pacemaker function See Pace Art report No changes today  

## 2011-06-05 ENCOUNTER — Encounter (HOSPITAL_COMMUNITY)
Admission: RE | Admit: 2011-06-05 | Discharge: 2011-06-05 | Disposition: A | Discharge status: Home / Self Care | Payer: Medicare Other | Source: Ambulatory Visit | Attending: Internal Medicine | Admitting: Internal Medicine

## 2011-06-05 ENCOUNTER — Encounter (HOSPITAL_COMMUNITY): Payer: Self-pay

## 2011-06-05 DIAGNOSIS — M810 Age-related osteoporosis without current pathological fracture: Secondary | ICD-10-CM | POA: Insufficient documentation

## 2011-06-05 HISTORY — DX: Unspecified dislocation of unspecified patella, initial encounter: S83.006A

## 2011-06-05 MED ORDER — SODIUM CHLORIDE 0.9 % IV SOLN
INTRAVENOUS | Status: AC
  Administered 2011-06-05: 14:00:00 via INTRAVENOUS

## 2011-06-05 MED ORDER — ZOLEDRONIC ACID 5 MG/100ML IV SOLN
5.0000 mg | Freq: Once | INTRAVENOUS | Status: AC
  Administered 2011-06-05: 5 mg via INTRAVENOUS
  Filled 2011-06-05: qty 100

## 2011-06-05 NOTE — Discharge Instructions (Signed)
Zoledronic Acid injection (Paget's Disease, Osteoporosis) What is this medicine? ZOLEDRONIC ACID (ZOE le dron ik AS id) lowers the amount of calcium loss from bone. It is used to treat Paget's disease and osteoporosis in women. This medicine may be used for other purposes; ask your health care provider or pharmacist if you have questions. What should I tell my health care provider before I take this medicine? They need to know if you have any of these conditions: -aspirin-sensitive asthma -dental disease -kidney disease -low levels of calcium in the blood -past surgery on the parathyroid gland or intestines -an unusual or allergic reaction to zoledronic acid, other medicines, foods, dyes, or preservatives -pregnant or trying to get pregnant -breast-feeding How should I use this medicine? This medicine is for infusion into a vein. It is given by a health care professional in a hospital or clinic setting. Talk to your pediatrician regarding the use of this medicine in children. This medicine is not approved for use in children. Overdosage: If you think you have taken too much of this medicine contact a poison control center or emergency room at once. NOTE: This medicine is only for you. Do not share this medicine with others. What if I miss a dose? It is important not to miss your dose. Call your doctor or health care professional if you are unable to keep an appointment. What may interact with this medicine? -certain antibiotics given by injection -NSAIDs, medicines for pain and inflammation, like ibuprofen or naproxen -some diuretics like bumetanide, furosemide -teriparatide This list may not describe all possible interactions. Give your health care provider a list of all the medicines, herbs, non-prescription drugs, or dietary supplements you use. Also tell them if you smoke, drink alcohol, or use illegal drugs. Some items may interact with your medicine. What should I watch for while  using this medicine? Visit your doctor or health care professional for regular checkups. It may be some time before you see the benefit from this medicine. Do not stop taking your medicine unless your doctor tells you to. Your doctor may order blood tests or other tests to see how you are doing. Women should inform their doctor if they wish to become pregnant or think they might be pregnant. There is a potential for serious side effects to an unborn child. Talk to your health care professional or pharmacist for more information. You should make sure that you get enough calcium and vitamin D while you are taking this medicine. Discuss the foods you eat and the vitamins you take with your health care professional. Some people who take this medicine have severe bone, joint, and/or muscle pain. This medicine may also increase your risk for a broken thigh bone. Tell your doctor right away if you have pain in your upper leg or groin. Tell your doctor if you have any pain that does not go away or that gets worse. What side effects may I notice from receiving this medicine? Side effects that you should report to your doctor or health care professional as soon as possible: -allergic reactions like skin rash, itching or hives, swelling of the face, lips, or tongue -breathing problems -changes in vision -feeling faint or lightheaded, falls -jaw burning, cramping, or pain -muscle cramps, stiffness, or weakness -trouble passing urine or change in the amount of urine Side effects that usually do not require medical attention (report to your doctor or health care professional if they continue or are bothersome): -bone, joint, or muscle pain -fever -  irritation at site where injected -loss of appetite -nausea, vomiting -stomach upset -tired This list may not describe all possible side effects. Call your doctor for medical advice about side effects. You may report side effects to FDA at 1-800-FDA-1088. Where  should I keep my medicine? This drug is given in a hospital or clinic and will not be stored at home. NOTE: This sheet is a summary. It may not cover all possible information. If you have questions about this medicine, talk to your doctor, pharmacist, or health care provider.  2012, [employer] Standard. (06/30/2010 9:08:15 AM) 

## 2011-06-07 ENCOUNTER — Encounter: Payer: 59 | Admitting: *Deleted

## 2011-06-08 ENCOUNTER — Encounter: Payer: Self-pay | Admitting: *Deleted

## 2011-06-19 ENCOUNTER — Encounter: Payer: Self-pay | Admitting: Internal Medicine

## 2011-06-19 ENCOUNTER — Ambulatory Visit (INDEPENDENT_AMBULATORY_CARE_PROVIDER_SITE_OTHER): Payer: 59 | Admitting: *Deleted

## 2011-06-19 DIAGNOSIS — I495 Sick sinus syndrome: Secondary | ICD-10-CM

## 2011-06-19 LAB — REMOTE PACEMAKER DEVICE
AL IMPEDENCE PM: 803 Ohm
BAMS-0001: 175 {beats}/min
RV LEAD AMPLITUDE: 16 mv
RV LEAD IMPEDENCE PM: 358 Ohm
RV LEAD THRESHOLD: 0.75 V

## 2011-06-29 ENCOUNTER — Encounter: Payer: Self-pay | Admitting: *Deleted

## 2011-07-23 ENCOUNTER — Encounter: Payer: Self-pay | Admitting: Internal Medicine

## 2011-07-23 DIAGNOSIS — Z950 Presence of cardiac pacemaker: Secondary | ICD-10-CM | POA: Insufficient documentation

## 2011-08-23 ENCOUNTER — Telehealth: Payer: Self-pay

## 2011-08-23 NOTE — Telephone Encounter (Signed)
Left message

## 2011-08-28 ENCOUNTER — Encounter: Payer: Self-pay | Admitting: Internal Medicine

## 2011-08-28 ENCOUNTER — Ambulatory Visit (INDEPENDENT_AMBULATORY_CARE_PROVIDER_SITE_OTHER): Payer: Medicare Other | Admitting: Internal Medicine

## 2011-08-28 VITALS — BP 128/70 | HR 60 | Ht 63.0 in | Wt 137.0 lb

## 2011-08-28 DIAGNOSIS — K5730 Diverticulosis of large intestine without perforation or abscess without bleeding: Secondary | ICD-10-CM

## 2011-08-28 DIAGNOSIS — Z1211 Encounter for screening for malignant neoplasm of colon: Secondary | ICD-10-CM

## 2011-08-28 NOTE — Patient Instructions (Addendum)
Please follow up as needed 

## 2011-08-28 NOTE — Progress Notes (Signed)
HISTORY OF PRESENT ILLNESS:  Robin Atkins is a 78 y.o. female with a medical history as listed below. She is sent today by her primary provider, Dr. Waynard Atkins, regarding possible screening colonoscopy. The patient initially underwent routine screening colonoscopy in March of 2003. The examination was complete to the cecum with excellent preparation. She was found to have left-sided diverticulosis and internal hemorrhoids. No polyps or other problems. Given her age, no routine followup assigned. She recently underwent routine annual comprehensive assessment. This was unremarkable. Review of outside blood work reveals normal CBC, comprehensive metabolic panel, thyroid testing, urinalysis, and negative Hemoccult testing. The patient's entire GI review of systems has been carefully reviewed and is negative. There has been no interval development of colon cancer in her family. She is in excellent condition and walks approximately 2 miles per day. She is on medications as listed.  REVIEW OF SYSTEMS:  All non-GI ROS Completely reviewed and reported as negative   Past Medical History  Diagnosis Date  . MVP (mitral valve prolapse)   . Hyperlipidemia   . OP (osteoporosis)   . SSS (sick sinus syndrome)     s/p PTVP  . Hypothyroidism   . Broken foot     Broken right foot and broken left knee in 2011  . S/P cardiac pacemaker procedure     Original implant 1998 with generator change in 2005. She has had prior lead revision.   . Knee cap dislocation 2011    left  . Hx of migraine headaches   . Benign recurrent vertigo   . Diverticulosis   . Shingles   . GERD (gastroesophageal reflux disease)   . Wrist fracture     lt  . Cataracts, bilateral   . Hemorrhoids     Past Surgical History  Procedure Date  . Total hip arthroplasty     right  . Pacemaker insertion 1998/2005    TRANSVENOUS PACEMAKER  . US echocardiography 11/25/2008    EF 55-60%; mild to moderate AI  . Cardiovascular stress test  07/10/2005    EF 78%, NO ISCHEMIA  . Hemorroidectomy     Social History Robin Atkins  reports that she has never smoked. She has never used smokeless tobacco. She reports that she does not drink alcohol or use illicit drugs.  family history includes Cancer in her father; Diabetes in her sister; Heart attack in her mother; Lung cancer in her brother; and Lymphoma in her sister.  Allergies  Allergen Reactions  . Prednisone        PHYSICAL EXAMINATION: Vital signs: BP 128/70  Pulse 60  Ht 5\' 3"  (1.6 m)  Wt 137 lb (62.143 kg)  BMI 24.27 kg/m2 General: Well-developed, well-nourished, no acute distress HEENT: Sclerae are anicteric, conjunctiva pink. Oral mucosa intact Lungs: Clear Heart: Regular Abdomen: soft, nontender, nondistended, no obvious ascites, no peritoneal signs, normal bowel sounds. No organomegaly. Extremities: No edema Psychiatric: alert and oriented x3. Cooperative    ASSESSMENT:  #[id number]. 78 year old female sent today regarding possible screening colonoscopy. Negative examination (for neoplasia) 10 years previous with diverticulosis noted. We discussed today in detail the current guidelines for colorectal screening and surveillance colonoscopy. We also reviewed the nature of colonoscopy as well as its risks, benefits, and alternatives. She is completely asymptomatic with normal laboratories are negative Hemoccult testing. I would not recommend routine followup colonoscopy for this patient at this time, given the afore mentioned and her age (though she is in excellent physical condition). She is willing to  undergo colonoscopy at necessary, but prefers not based on this discussion. She certainly understands that the need for colonoscopy could arise due to clinical change. She could continue with annual Hemoccult studies if she remains in excellent physical condition.

## 2011-09-27 ENCOUNTER — Encounter: Payer: Self-pay | Admitting: Internal Medicine

## 2011-09-27 ENCOUNTER — Ambulatory Visit (INDEPENDENT_AMBULATORY_CARE_PROVIDER_SITE_OTHER): Payer: Medicare Other | Admitting: *Deleted

## 2011-09-27 DIAGNOSIS — I495 Sick sinus syndrome: Secondary | ICD-10-CM

## 2011-10-03 LAB — REMOTE PACEMAKER DEVICE
ATRIAL PACING PM: 100
BATTERY VOLTAGE: 2.73 V
RV LEAD AMPLITUDE: 11.2 mv
RV LEAD IMPEDENCE PM: 364 Ohm
VENTRICULAR PACING PM: 0

## 2011-10-04 DIAGNOSIS — Z2839 Other underimmunization status: Secondary | ICD-10-CM | POA: Insufficient documentation

## 2011-10-19 ENCOUNTER — Encounter: Payer: Self-pay | Admitting: *Deleted

## 2011-10-22 ENCOUNTER — Other Ambulatory Visit: Payer: Self-pay | Admitting: Internal Medicine

## 2011-10-22 DIAGNOSIS — T148XXA Other injury of unspecified body region, initial encounter: Secondary | ICD-10-CM

## 2011-10-22 DIAGNOSIS — M549 Dorsalgia, unspecified: Secondary | ICD-10-CM

## 2011-10-23 ENCOUNTER — Ambulatory Visit
Admission: RE | Admit: 2011-10-23 | Discharge: 2011-10-23 | Disposition: A | Discharge status: Home / Self Care | Payer: Medicare Other | Source: Ambulatory Visit | Attending: Internal Medicine | Admitting: Internal Medicine

## 2011-10-23 DIAGNOSIS — M549 Dorsalgia, unspecified: Secondary | ICD-10-CM

## 2011-10-23 DIAGNOSIS — T148XXA Other injury of unspecified body region, initial encounter: Secondary | ICD-10-CM

## 2011-10-23 MED ORDER — IOHEXOL 300 MG/ML  SOLN
100.0000 mL | Freq: Once | INTRAMUSCULAR | Status: AC | PRN
  Administered 2011-10-23: 100 mL via INTRAVENOUS

## 2012-01-07 ENCOUNTER — Ambulatory Visit (INDEPENDENT_AMBULATORY_CARE_PROVIDER_SITE_OTHER): Payer: Medicare Other | Admitting: *Deleted

## 2012-01-07 DIAGNOSIS — I495 Sick sinus syndrome: Secondary | ICD-10-CM

## 2012-01-07 DIAGNOSIS — Z950 Presence of cardiac pacemaker: Secondary | ICD-10-CM

## 2012-01-14 LAB — REMOTE PACEMAKER DEVICE
AL IMPEDENCE PM: 783 Ohm
AL THRESHOLD: 0.625 V
RV LEAD AMPLITUDE: 11.2 mv
RV LEAD IMPEDENCE PM: 363 Ohm
RV LEAD THRESHOLD: 0.75 V

## 2012-01-22 ENCOUNTER — Encounter: Payer: Self-pay | Admitting: *Deleted

## 2012-01-24 ENCOUNTER — Encounter: Payer: Self-pay | Admitting: Internal Medicine

## 2012-03-07 ENCOUNTER — Ambulatory Visit (INDEPENDENT_AMBULATORY_CARE_PROVIDER_SITE_OTHER): Payer: Medicare Other | Admitting: Internal Medicine

## 2012-03-07 ENCOUNTER — Encounter: Payer: Self-pay | Admitting: Internal Medicine

## 2012-03-07 VITALS — BP 135/69 | HR 79 | Wt 137.4 lb

## 2012-03-07 DIAGNOSIS — I495 Sick sinus syndrome: Secondary | ICD-10-CM

## 2012-03-07 LAB — PACEMAKER DEVICE OBSERVATION
AL IMPEDENCE PM: 823 Ohm
BATTERY VOLTAGE: 2.7 V
RV LEAD AMPLITUDE: 11.2 mv
RV LEAD IMPEDENCE PM: 364 Ohm
VENTRICULAR PACING PM: 0.3

## 2012-03-07 NOTE — Patient Instructions (Signed)
Remote monitoring is used to monitor your Pacemaker of ICD from home. This monitoring reduces the number of office visits required to check your device to one time per year. It allows us to keep an eye on the functioning of your device to ensure it is working properly. You are scheduled for a device check from home on Jun 02, 2012. You may send your transmission at any time that day. If you have a wireless device, the transmission will be sent automatically. After your physician reviews your transmission, you will receive a postcard with your next transmission date.  Your physician wants you to follow-up in: 1 year with Dr Allred.  You will receive a reminder letter in the mail two months in advance. If you don't receive a letter, please call our office to schedule the follow-up appointment.  

## 2012-03-07 NOTE — Assessment & Plan Note (Signed)
Normal pacemaker function See Pace Art report No changes today  

## 2012-03-07 NOTE — Progress Notes (Signed)
PCP:  Ezequiel Kayser, MD  The patient presents today for routine electrophysiology followup.  Since last being seen in our clinic, the patient reports doing very well.  She remains very active.  Today, she denies symptoms of palpitations, chest pain, shortness of breath, orthopnea, PND, lower extremity edema, dizziness, presyncope, syncope, or neurologic sequela.  The patient feels that she is tolerating medications without difficulties and is otherwise without complaint today.   Past Medical History  Diagnosis Date  . MVP (mitral valve prolapse)   . Hyperlipidemia   . OP (osteoporosis)   . SSS (sick sinus syndrome)     s/p PTVP  . Hypothyroidism   . Broken foot     Broken right foot and broken left knee in 2011  . S/P cardiac pacemaker procedure     Original implant 1998 with generator change in 2005. She has had prior lead revision.   . Knee cap dislocation 2011    left  . Hx of migraine headaches   . Benign recurrent vertigo   . Diverticulosis   . Shingles   . GERD (gastroesophageal reflux disease)   . Wrist fracture     lt  . Cataracts, bilateral   . Hemorrhoids    Past Surgical History  Procedure Laterality Date  . Total hip arthroplasty      right  . Pacemaker insertion  1998/2005    TRANSVENOUS PACEMAKER  . US echocardiography  11/25/2008    EF 55-60%; mild to moderate AI  . Cardiovascular stress test  07/10/2005    EF 78%, NO ISCHEMIA  . Hemorroidectomy      Current Outpatient Prescriptions  Medication Sig Dispense Refill  . aspirin 81 MG tablet Take 81 mg by mouth daily.        . calcium carbonate (OS-CAL) 600 MG TABS Take 600 mg by mouth daily.       . cholecalciferol (VITAMIN D) 1000 UNITS tablet Take 1,000 Units by mouth daily.        . fish oil-omega-3 fatty acids 1000 MG capsule Take 2 g by mouth daily.        Marland Kitchen levothyroxine (SYNTHROID, LEVOTHROID) 50 MCG tablet Take 50 mcg by mouth daily.        . Multiple Minerals-Vitamins (CITRACAL PLUS PO) Take 1  tablet by mouth daily.      . Multiple Vitamin (MULTIVITAMIN) tablet Take 1 tablet by mouth daily.        . pravastatin (PRAVACHOL) 10 MG tablet Take 20 mg by mouth daily.       . zoledronic acid (RECLAST) 5 MG/100ML SOLN Inject 5 mg into the vein once. Infuse 5mg  iv once a year in April.  Last 4/12, third and fourth dose 5/13       No current facility-administered medications for this visit.    Allergies  Allergen Reactions  . Prednisone     History   Social History  . Marital Status: Married    Spouse Name: N/A    Number of Children: 2  . Years of Education: N/A   Occupational History  . retired    Social History Main Topics  . Smoking status: Never Smoker   . Smokeless tobacco: Never Used  . Alcohol Use: No  . Drug Use: No  . Sexually Active: Not on file   Other Topics Concern  . Not on file   Social History Narrative  . No narrative on file    Family History  Problem Relation Age  of Onset  . Heart attack Mother   . Cancer Father     brain tumour  . Lung cancer Brother     x2  . Diabetes Sister     x 2 brothers  . Lymphoma Sister     Physical Exam: Filed Vitals:   03/07/12 1128  BP: 135/69  Pulse: 79  Weight: 137 lb 6.4 oz (62.324 kg)    GEN- The patient is well appearing, alert and oriented x 3 today.   Head- normocephalic, atraumatic Eyes-  Sclera clear, conjunctiva pink Ears- hearing intact Oropharynx- clear Neck- supple, no JVP Lymph- no cervical lymphadenopathy Lungs- Clear to ausculation bilaterally, normal work of breathing Chest- pacemaker pocket is well healed Heart- Regular rate and rhythm, no murmurs, rubs or gallops, PMI not laterally displaced GI- soft, NT, ND, + BS Extremities- no clubbing, cyanosis, or edema  Pacemaker interrogation- reviewed in detail today,  See PACEART report  ekg today reveals atrial pacing, PR 206, QRS 78, Qtc 420, otherwise normal ekg  Assessment and Plan:

## 2012-04-28 ENCOUNTER — Emergency Department (HOSPITAL_COMMUNITY): Payer: Medicare Other

## 2012-04-28 ENCOUNTER — Inpatient Hospital Stay (HOSPITAL_COMMUNITY)
Admission: EM | Admit: 2012-04-28 | Discharge: 2012-05-02 | DRG: 481 | Disposition: A | Discharge status: Skilled Nursing Facility | Payer: Medicare Other | Attending: Family Medicine | Admitting: Family Medicine

## 2012-04-28 ENCOUNTER — Encounter (HOSPITAL_COMMUNITY): Payer: Self-pay

## 2012-04-28 DIAGNOSIS — I495 Sick sinus syndrome: Secondary | ICD-10-CM | POA: Diagnosis present

## 2012-04-28 DIAGNOSIS — I059 Rheumatic mitral valve disease, unspecified: Secondary | ICD-10-CM | POA: Diagnosis present

## 2012-04-28 DIAGNOSIS — E039 Hypothyroidism, unspecified: Secondary | ICD-10-CM | POA: Diagnosis present

## 2012-04-28 DIAGNOSIS — Z950 Presence of cardiac pacemaker: Secondary | ICD-10-CM

## 2012-04-28 DIAGNOSIS — S72309A Unspecified fracture of shaft of unspecified femur, initial encounter for closed fracture: Principal | ICD-10-CM | POA: Diagnosis present

## 2012-04-28 DIAGNOSIS — D62 Acute posthemorrhagic anemia: Secondary | ICD-10-CM

## 2012-04-28 DIAGNOSIS — S72301A Unspecified fracture of shaft of right femur, initial encounter for closed fracture: Secondary | ICD-10-CM

## 2012-04-28 DIAGNOSIS — E785 Hyperlipidemia, unspecified: Secondary | ICD-10-CM | POA: Diagnosis present

## 2012-04-28 DIAGNOSIS — W010XXA Fall on same level from slipping, tripping and stumbling without subsequent striking against object, initial encounter: Secondary | ICD-10-CM | POA: Diagnosis present

## 2012-04-28 DIAGNOSIS — Y92009 Unspecified place in unspecified non-institutional (private) residence as the place of occurrence of the external cause: Secondary | ICD-10-CM

## 2012-04-28 DIAGNOSIS — Z96649 Presence of unspecified artificial hip joint: Secondary | ICD-10-CM

## 2012-04-28 DIAGNOSIS — M810 Age-related osteoporosis without current pathological fracture: Secondary | ICD-10-CM | POA: Diagnosis present

## 2012-04-28 DIAGNOSIS — Z79899 Other long term (current) drug therapy: Secondary | ICD-10-CM

## 2012-04-28 LAB — CBC WITH DIFFERENTIAL/PLATELET
Basophils Absolute: 0 K/uL (ref 0.0–0.1)
Basophils Relative: 0 % (ref 0–1)
Eosinophils Absolute: 0.1 10*3/uL (ref 0.0–0.7)
Eosinophils Relative: 1 % (ref 0–5)
HCT: 31.9 % — ABNORMAL LOW (ref 36.0–46.0)
Hemoglobin: 10.5 g/dL — ABNORMAL LOW (ref 12.0–15.0)
Lymphocytes Relative: 19 % (ref 12–46)
Lymphs Abs: 1.4 10*3/uL (ref 0.7–4.0)
MCH: 29.1 pg (ref 26.0–34.0)
MCHC: 32.9 g/dL (ref 30.0–36.0)
MCV: 88.4 fL (ref 78.0–100.0)
Monocytes Absolute: 0.6 K/uL (ref 0.1–1.0)
Monocytes Relative: 8 % (ref 3–12)
Neutro Abs: 5.3 K/uL (ref 1.7–7.7)
Neutrophils Relative %: 72 % (ref 43–77)
Platelets: 141 K/uL — ABNORMAL LOW (ref 150–400)
RBC: 3.61 MIL/uL — ABNORMAL LOW (ref 3.87–5.11)
RDW: 14.6 % (ref 11.5–15.5)
WBC: 7.4 K/uL (ref 4.0–10.5)

## 2012-04-28 LAB — BASIC METABOLIC PANEL WITH GFR
BUN: 17 mg/dL (ref 6–23)
Calcium: 8.8 mg/dL (ref 8.4–10.5)
Chloride: 101 meq/L (ref 96–112)
Creatinine, Ser: 0.91 mg/dL (ref 0.50–1.10)
GFR calc Af Amer: 65 mL/min — ABNORMAL LOW (ref >90)

## 2012-04-28 LAB — BASIC METABOLIC PANEL
CO2: 28 mEq/L (ref 19–32)
GFR calc non Af Amer: 56 mL/min — ABNORMAL LOW (ref >90)
Glucose, Bld: 112 mg/dL — ABNORMAL HIGH (ref 70–99)
Potassium: 4.2 mEq/L (ref 3.5–5.1)
Sodium: 136 mEq/L (ref 135–145)

## 2012-04-28 LAB — TYPE AND SCREEN
ABO/RH(D): O NEG
Antibody Screen: NEGATIVE

## 2012-04-28 LAB — PROTIME-INR
INR: 1 (ref 0.00–1.49)
Prothrombin Time: 13.1 s (ref 11.6–15.2)

## 2012-04-28 MED ORDER — FENTANYL CITRATE 0.05 MG/ML IJ SOLN
50.0000 ug | INTRAMUSCULAR | Status: AC | PRN
  Administered 2012-04-29 (×2): 50 ug via INTRAVENOUS
  Filled 2012-04-28 (×2): qty 2

## 2012-04-28 MED ORDER — SODIUM CHLORIDE 0.9 % IV SOLN
1000.0000 mL | INTRAVENOUS | Status: DC
  Administered 2012-04-28: 1000 mL via INTRAVENOUS

## 2012-04-28 NOTE — ED Notes (Signed)
Pt was cleaning and tripped over exercise equipment and injured her right femur, ems applied traction splint, pt has had a total of 150 mcg of fentanyl per ems. 145/76, 68 HR per ems

## 2012-04-28 NOTE — Consult Note (Signed)
Reason for Consult: Right leg pain Referring Physician: EDP  Robin Atkins is an 79 y.o. female.  HPI: S/P hip replacement by Dr. Thurston Hole. Patient fell.  Past Medical History  Diagnosis Date  . MVP (mitral valve prolapse)   . Hyperlipidemia   . OP (osteoporosis)   . SSS (sick sinus syndrome)     s/p PTVP  . Hypothyroidism   . Broken foot     Broken right foot and broken left knee in 2011  . S/P cardiac pacemaker procedure     Original implant 1998 with generator change in 2005. She has had prior lead revision.   . Knee cap dislocation 2011    left  . Hx of migraine headaches   . Benign recurrent vertigo   . Diverticulosis   . Shingles   . GERD (gastroesophageal reflux disease)   . Wrist fracture     lt  . Cataracts, bilateral   . Hemorrhoids     Past Surgical History  Procedure Laterality Date  . Total hip arthroplasty      right  . Pacemaker insertion  1998/2005    TRANSVENOUS PACEMAKER  . US echocardiography  11/25/2008    EF 55-60%; mild to moderate AI  . Cardiovascular stress test  07/10/2005    EF 78%, NO ISCHEMIA  . Hemorroidectomy      Family History  Problem Relation Age of Onset  . Heart attack Mother   . Cancer Father     brain tumour  . Lung cancer Brother     x2  . Diabetes Sister     x 2 brothers  . Lymphoma Sister     Social History:  reports that she has never smoked. She has never used smokeless tobacco. She reports that she does not drink alcohol or use illicit drugs.  Allergies:  Allergies  Allergen Reactions  . Prednisone     Medications: I have reviewed the patient's current medications.  Results for orders placed during the hospital encounter of 04/28/12 (from the past 48 hour(s))  BASIC METABOLIC PANEL     Status: Abnormal   Collection Time    04/28/12  8:25 PM      Result Value Range   Sodium 136  135 - 145 mEq/L   Potassium 4.2  3.5 - 5.1 mEq/L   Chloride 101  96 - 112 mEq/L   CO2 28  19 - 32 mEq/L   Glucose, Bld 112  (*) 70 - 99 mg/dL   BUN 17  6 - 23 mg/dL   Creatinine, Ser 8.11  0.50 - 1.10 mg/dL   Calcium 8.8  8.4 - 91.4 mg/dL   GFR calc non Af Amer 56 (*) >90 mL/min   GFR calc Af Amer 65 (*) >90 mL/min   Comment:            The eGFR has been calculated     using the CKD EPI equation.     This calculation has not been     validated in all clinical     situations.     eGFR's persistently     <90 mL/min signify     possible Chronic Kidney Disease.  CBC WITH DIFFERENTIAL     Status: Abnormal   Collection Time    04/28/12  8:25 PM      Result Value Range   WBC 7.4  4.0 - 10.5 K/uL   RBC 3.61 (*) 3.87 - 5.11 MIL/uL   Hemoglobin 10.5 (*)  12.0 - 15.0 g/dL   HCT 91.4 (*) 78.2 - 95.6 %   MCV 88.4  78.0 - 100.0 fL   MCH 29.1  26.0 - 34.0 pg   MCHC 32.9  30.0 - 36.0 g/dL   RDW 21.3  08.6 - 57.8 %   Platelets 141 (*) 150 - 400 K/uL   Neutrophils Relative 72  43 - 77 %   Neutro Abs 5.3  1.7 - 7.7 K/uL   Lymphocytes Relative 19  12 - 46 %   Lymphs Abs 1.4  0.7 - 4.0 K/uL   Monocytes Relative 8  3 - 12 %   Monocytes Absolute 0.6  0.1 - 1.0 K/uL   Eosinophils Relative 1  0 - 5 %   Eosinophils Absolute 0.1  0.0 - 0.7 K/uL   Basophils Relative 0  0 - 1 %   Basophils Absolute 0.0  0.0 - 0.1 K/uL  PROTIME-INR     Status: None   Collection Time    04/28/12  8:25 PM      Result Value Range   Prothrombin Time 13.1  11.6 - 15.2 seconds   INR 1.00  0.00 - 1.49  TYPE AND SCREEN     Status: None   Collection Time    04/28/12  8:28 PM      Result Value Range   ABO/RH(D) O NEG     Antibody Screen NEG     Sample Expiration 05/01/2012      Dg Chest 1 View  04/28/2012  *RADIOLOGY REPORT*  Clinical Data: Larey Seat.  Right leg pain.  CHEST - 1 VIEW  Comparison: 06/17/2007.  Findings: The cardiac silhouette, mediastinal and hilar contours are normal.  The lungs are clear.  The pacer wires are stable.  The bony thorax is grossly normal.  IMPRESSION: No acute cardiopulmonary findings.   Original Report  Authenticated By: Rudie Meyer, M.D.    Dg Femur Right  04/28/2012  *RADIOLOGY REPORT*  Clinical Data: Mid right femur pain secondary to a fall.  RIGHT FEMUR - 2 VIEW  Comparison: None.  Findings: There is a slightly comminuted slightly displaced slightly angulated spiral fracture of the distal right femoral shaft.  Right total hip prosthesis is properly located.  No other acute abnormalities.  IMPRESSION: Spiral fracture of the distal right femoral shaft.   Original Report Authenticated By: Francene Boyers, M.D.     Review of Systems  Musculoskeletal: Positive for joint pain.  All other systems reviewed and are negative.   Blood pressure 144/84, pulse 62, temperature 98.1 F (36.7 C), temperature source Oral, resp. rate 16, SpO2 98.00%. Physical Exam  Vitals reviewed. Constitutional: She is oriented to person, place, and time. She appears well-developed.  HENT:  Head: Normocephalic.  Eyes: Pupils are equal, round, and reactive to light.  Neck: Normal range of motion.  Cardiovascular: Normal rate.   Respiratory: Effort normal and breath sounds normal.  GI: Soft. Bowel sounds are normal.  Musculoskeletal:  Right leg short and ER. NVI. No DVT. Compartments soft.  Neurological: She is alert and oriented to person, place, and time.  Skin: Skin is warm and dry.  Psychiatric: She has a normal mood and affect.    Assessment/Plan: Right femur periprosthetic femur fracture closed. Plan ORIF. Will discuss with Dr. Charlann Boxer. Preoperative clearance. Traction. Risks discussed.  Robin Atkins C 04/28/2012, 11:29 PM

## 2012-04-28 NOTE — ED Provider Notes (Addendum)
History     CSN: [id number]  Arrival date & time 04/28/12  1932   First MD Initiated Contact with Patient 04/28/12 1959      Chief Complaint  Patient presents with  . Fall  . Leg Injury    (Consider location/radiation/quality/duration/timing/severity/associated sxs/prior treatment) HPI Comments: Patient reports that she was at home in her usual state of health, was dusting and it stuck backwards, tripped over a exercise equipment and fell injuring her right thigh. Patient has a prior history of left femur or knee fracture. She had surgery in [place] many years ago. Patient does follow with Dr. Thurston Hole. Patient denies any recent episodes of chest pain, palpitations, cough or cold symptoms. Patient reports she has a headache currently which he attributes to a long-standing history of migraines. She reports no head injury, loss of consciousness. She denies neck or back pain, right shoulder pain, chest pain, abdominal pain. Patient comes by EMS with right lower extremity in traction with improvement of her pain. She also received IV fentanyl. Patient reports pain in her right leg is improved after the above measures.  Patient is a 79 y.o. female presenting with fall. The history is provided by the patient, the spouse and medical records.  Fall Associated symptoms include headaches.    Past Medical History  Diagnosis Date  . MVP (mitral valve prolapse)   . Hyperlipidemia   . OP (osteoporosis)   . SSS (sick sinus syndrome)     s/p PTVP  . Hypothyroidism   . Broken foot     Broken right foot and broken left knee in 2011  . S/P cardiac pacemaker procedure     Original implant 1998 with generator change in 2005. She has had prior lead revision.   . Knee cap dislocation 2011    left  . Hx of migraine headaches   . Benign recurrent vertigo   . Diverticulosis   . Shingles   . GERD (gastroesophageal reflux disease)   . Wrist fracture     lt  . Cataracts, bilateral   . Hemorrhoids      Past Surgical History  Procedure Laterality Date  . Total hip arthroplasty      right  . Pacemaker insertion  1998/2005    TRANSVENOUS PACEMAKER  . US echocardiography  11/25/2008    EF 55-60%; mild to moderate AI  . Cardiovascular stress test  07/10/2005    EF 78%, NO ISCHEMIA  . Hemorroidectomy      Family History  Problem Relation Age of Onset  . Heart attack Mother   . Cancer Father     brain tumour  . Lung cancer Brother     x2  . Diabetes Sister     x 2 brothers  . Lymphoma Sister     History  Substance Use Topics  . Smoking status: Never Smoker   . Smokeless tobacco: Never Used  . Alcohol Use: No    OB History   Grav Para Term Preterm Abortions TAB SAB Ect Mult Living                  Review of Systems  Respiratory: Negative for chest tightness.   Cardiovascular: Negative for chest pain and palpitations.  Musculoskeletal: Positive for arthralgias.  Neurological: Positive for headaches. Negative for syncope.  All other systems reviewed and are negative.    Allergies  Prednisone  Home Medications   Current Outpatient Rx  Name  Route  Sig  Dispense  Refill  .  aspirin 81 MG tablet   Oral   Take 81 mg by mouth daily.           . calcium carbonate (OS-CAL) 600 MG TABS   Oral   Take 600 mg by mouth daily.          . cholecalciferol (VITAMIN D) 1000 UNITS tablet   Oral   Take 1,000 Units by mouth daily.           . fish oil-omega-3 fatty acids 1000 MG capsule   Oral   Take 2 g by mouth daily.           Marland Kitchen levothyroxine (SYNTHROID, LEVOTHROID) 50 MCG tablet   Oral   Take 50 mcg by mouth daily.           . Multiple Vitamin (MULTIVITAMIN) tablet   Oral   Take 1 tablet by mouth daily.           . pravastatin (PRAVACHOL) 10 MG tablet   Oral   Take 20 mg by mouth daily.          . zoledronic acid (RECLAST) 5 MG/100ML SOLN   Intravenous   Inject 5 mg into the vein once. Infuse 5mg  iv once a year in April.  Last 4/12, third  and fourth dose 5/13           BP 144/84  Pulse 62  Temp(Src) 98.1 F (36.7 C) (Oral)  Resp 16  SpO2 98%  Physical Exam  Nursing note and vitals reviewed. Constitutional: She is oriented to person, place, and time. She appears well-developed and well-nourished. No distress.  HENT:  Head: Normocephalic and atraumatic.  Eyes: EOM are normal. Pupils are equal, round, and reactive to light.  Neck: Normal range of motion. Neck supple.  Cardiovascular: Normal rate, regular rhythm and intact distal pulses.   No murmur heard. Pulmonary/Chest: Effort normal. No respiratory distress. She has no wheezes. She has no rales.  Abdominal: She exhibits no distension. There is no tenderness. There is no rebound and no guarding.  Musculoskeletal:       Right knee: She exhibits no swelling. No tenderness found.       Right ankle: She exhibits normal range of motion and no swelling. No tenderness.       Right upper leg: She exhibits tenderness and swelling.       Legs: Neurological: She is alert and oriented to person, place, and time. She has normal strength. No sensory deficit. She exhibits normal muscle tone. Coordination normal. GCS eye subscore is 4. GCS verbal subscore is 5. GCS motor subscore is 6.  Skin: Skin is warm and dry. No rash noted. She is not diaphoretic.  Psychiatric: She has a normal mood and affect.    ED Course  Procedures (including critical care time)  Labs Reviewed  BASIC METABOLIC PANEL - Abnormal; Notable for the following:    Glucose, Bld 112 (*)    GFR calc non Af Amer 56 (*)    GFR calc Af Amer 65 (*)    All other components within normal limits  CBC WITH DIFFERENTIAL - Abnormal; Notable for the following:    RBC 3.61 (*)    Hemoglobin 10.5 (*)    HCT 31.9 (*)    Platelets 141 (*)    All other components within normal limits  PROTIME-INR  TYPE AND SCREEN  ABO/RH   Dg Chest 1 View  04/28/2012  *RADIOLOGY REPORT*  Clinical Data: Larey Seat.  Right leg  pain.   CHEST - 1 VIEW  Comparison: 06/17/2007.  Findings: The cardiac silhouette, mediastinal and hilar contours are normal.  The lungs are clear.  The pacer wires are stable.  The bony thorax is grossly normal.  IMPRESSION: No acute cardiopulmonary findings.   Original Report Authenticated By: Rudie Meyer, M.D.    Dg Femur Right  04/28/2012  *RADIOLOGY REPORT*  Clinical Data: Mid right femur pain secondary to a fall.  RIGHT FEMUR - 2 VIEW  Comparison: None.  Findings: There is a slightly comminuted slightly displaced slightly angulated spiral fracture of the distal right femoral shaft.  Right total hip prosthesis is properly located.  No other acute abnormalities.  IMPRESSION: Spiral fracture of the distal right femoral shaft.   Original Report Authenticated By: Francene Boyers, M.D.      1. Fracture, femur, shaft, right, closed, initial encounter     Room air saturation is 98% I interpret this to be normal.  ECG at time 21:17 shows atrial paced rhythm at rate 63, normal axis, non specific ST abn's, normal intervals.  Interpretation is abn ECG.  Not significantly changed from ECG on 03/07/12.    Spoke to orthopedist on call for Dr. Thurston Hole who will converse with Murphy-Wainer and decide how to approach patient.  I expect ortho to see pt in the ED.  Will discuss with [facility].      11:16 PM Spoke to Dr. Thurston Hole twice and spoke to Dr. Shelle Iron who is on call for unassigned.  Pt appears to have seen Dr. Eulah Pont in October 2011 based on [facility].  Pt informed me that Dr. Eulah Pont is her orthopedist.     Dr. Shelle Iron has seen patient, spoke to Dr. Toniann Fail who agrees to admit to med-surg bed.   MDM   Patient with mechanical fall and history and exam concerning for femur fracture. Patient is currently in traction by EMS and patient's pain is currently improved. Plain films reveal a distal femur spiral fracture on the right, closed. Patient will require admission and consultation by orthopedics. I anticipate admission  to try hospitalist. Patient's primary care physician is Dr. Guillermina City. No evidence of compartment syndrome, distal neurovascular status is intact.        Gavin Pound. Oletta Lamas, MD 04/28/12 2137  Gavin Pound. Aury Scollard, MD 04/30/12 1200

## 2012-04-29 ENCOUNTER — Inpatient Hospital Stay (HOSPITAL_COMMUNITY): Payer: Medicare Other

## 2012-04-29 ENCOUNTER — Encounter (HOSPITAL_COMMUNITY): Payer: Self-pay | Admitting: Anesthesiology

## 2012-04-29 ENCOUNTER — Encounter (HOSPITAL_COMMUNITY)
Admission: EM | Disposition: A | Discharge status: Skilled Nursing Facility | Payer: Self-pay | Source: Home / Self Care | Attending: Internal Medicine

## 2012-04-29 ENCOUNTER — Encounter (HOSPITAL_COMMUNITY): Payer: Self-pay | Admitting: Internal Medicine

## 2012-04-29 ENCOUNTER — Inpatient Hospital Stay (HOSPITAL_COMMUNITY): Payer: Medicare Other | Admitting: Anesthesiology

## 2012-04-29 DIAGNOSIS — E039 Hypothyroidism, unspecified: Secondary | ICD-10-CM

## 2012-04-29 DIAGNOSIS — S72309A Unspecified fracture of shaft of unspecified femur, initial encounter for closed fracture: Secondary | ICD-10-CM

## 2012-04-29 DIAGNOSIS — I495 Sick sinus syndrome: Secondary | ICD-10-CM

## 2012-04-29 DIAGNOSIS — E785 Hyperlipidemia, unspecified: Secondary | ICD-10-CM | POA: Diagnosis present

## 2012-04-29 HISTORY — PX: ORIF FEMUR FRACTURE: SHX2119

## 2012-04-29 LAB — BASIC METABOLIC PANEL
Calcium: 8.6 mg/dL (ref 8.4–10.5)
Creatinine, Ser: 0.75 mg/dL (ref 0.50–1.10)
GFR calc Af Amer: 87 mL/min — ABNORMAL LOW (ref >90)
GFR calc non Af Amer: 75 mL/min — ABNORMAL LOW (ref >90)
Sodium: 135 mEq/L (ref 135–145)

## 2012-04-29 LAB — CBC
MCH: 29.4 pg (ref 26.0–34.0)
MCH: 29.4 pg (ref 26.0–34.0)
MCHC: 35.4 g/dL (ref 30.0–36.0)
MCV: 88.4 fL (ref 78.0–100.0)
Platelets: 137 10*3/uL — ABNORMAL LOW (ref 150–400)
Platelets: 82 10*3/uL — ABNORMAL LOW (ref 150–400)
RDW: 14.9 % (ref 11.5–15.5)

## 2012-04-29 LAB — URINALYSIS, MICROSCOPIC ONLY
Bilirubin Urine: NEGATIVE
Glucose, UA: NEGATIVE mg/dL
Hgb urine dipstick: NEGATIVE
Ketones, ur: NEGATIVE mg/dL
Leukocytes, UA: NEGATIVE
Nitrite: NEGATIVE
Protein, ur: NEGATIVE mg/dL
Specific Gravity, Urine: 1.02 (ref 1.005–1.030)
Urine-Other: NONE SEEN
Urobilinogen, UA: 0.2 mg/dL (ref 0.0–1.0)
pH: 6 (ref 5.0–8.0)

## 2012-04-29 LAB — ABO/RH: ABO/RH(D): O NEG

## 2012-04-29 LAB — SURGICAL PCR SCREEN
MRSA, PCR: NEGATIVE
Staphylococcus aureus: NEGATIVE

## 2012-04-29 LAB — PREPARE RBC (CROSSMATCH)

## 2012-04-29 LAB — ALBUMIN: Albumin: 3 g/dL — ABNORMAL LOW (ref 3.5–5.2)

## 2012-04-29 LAB — CREATININE, SERUM: Creatinine, Ser: 0.65 mg/dL (ref 0.50–1.10)

## 2012-04-29 SURGERY — OPEN REDUCTION INTERNAL FIXATION (ORIF) DISTAL FEMUR FRACTURE
Anesthesia: General | Site: Leg Upper | Laterality: Right | Wound class: Clean

## 2012-04-29 MED ORDER — NEOSTIGMINE METHYLSULFATE 1 MG/ML IJ SOLN
INTRAMUSCULAR | Status: DC | PRN
  Administered 2012-04-29: 3 mg via INTRAVENOUS

## 2012-04-29 MED ORDER — LIDOCAINE HCL 4 % MT SOLN
OROMUCOSAL | Status: DC | PRN
  Administered 2012-04-29: 4 mL via TOPICAL

## 2012-04-29 MED ORDER — DOCUSATE SODIUM 100 MG PO CAPS
100.0000 mg | ORAL_CAPSULE | Freq: Two times a day (BID) | ORAL | Status: DC
  Administered 2012-04-29 – 2012-05-02 (×6): 100 mg via ORAL
  Filled 2012-04-29 (×7): qty 1

## 2012-04-29 MED ORDER — ALBUMIN HUMAN 5 % IV SOLN
INTRAVENOUS | Status: DC | PRN
  Administered 2012-04-29: 16:00:00 via INTRAVENOUS

## 2012-04-29 MED ORDER — WARFARIN SODIUM 5 MG PO TABS
5.0000 mg | ORAL_TABLET | Freq: Once | ORAL | Status: AC
  Administered 2012-04-29: 5 mg via ORAL
  Filled 2012-04-29: qty 1

## 2012-04-29 MED ORDER — HYDROCODONE-ACETAMINOPHEN 5-325 MG PO TABS
1.0000 | ORAL_TABLET | Freq: Four times a day (QID) | ORAL | Status: DC | PRN
  Administered 2012-04-30 – 2012-05-01 (×4): 2 via ORAL
  Administered 2012-05-02: 1 via ORAL
  Administered 2012-05-02: 2 via ORAL
  Filled 2012-04-29: qty 1
  Filled 2012-04-29: qty 2
  Filled 2012-04-29 (×2): qty 1
  Filled 2012-04-29 (×3): qty 2

## 2012-04-29 MED ORDER — HYDROMORPHONE HCL PF 1 MG/ML IJ SOLN
INTRAMUSCULAR | Status: AC
  Filled 2012-04-29: qty 1

## 2012-04-29 MED ORDER — ONDANSETRON HCL 4 MG/2ML IJ SOLN: 4.0000 mg | Freq: Four times a day (QID) | INTRAMUSCULAR | Status: DC | PRN

## 2012-04-29 MED ORDER — PROPOFOL 10 MG/ML IV BOLUS
INTRAVENOUS | Status: DC | PRN
  Administered 2012-04-29: 150 mg via INTRAVENOUS

## 2012-04-29 MED ORDER — ONDANSETRON HCL 4 MG/2ML IJ SOLN: 4.0000 mg | Freq: Once | INTRAMUSCULAR | Status: DC | PRN

## 2012-04-29 MED ORDER — BISACODYL 10 MG RE SUPP: 10.0000 mg | Freq: Every day | RECTAL | Status: DC | PRN

## 2012-04-29 MED ORDER — ONDANSETRON HCL 4 MG/2ML IJ SOLN
INTRAMUSCULAR | Status: DC | PRN
  Administered 2012-04-29: 4 mg via INTRAVENOUS

## 2012-04-29 MED ORDER — CEFAZOLIN SODIUM 1-5 GM-% IV SOLN
INTRAVENOUS | Status: AC
  Filled 2012-04-29: qty 100

## 2012-04-29 MED ORDER — 0.9 % SODIUM CHLORIDE (POUR BTL) OPTIME
TOPICAL | Status: DC | PRN
  Administered 2012-04-29: 1000 mL

## 2012-04-29 MED ORDER — POTASSIUM CHLORIDE IN NACL 20-0.45 MEQ/L-% IV SOLN
INTRAVENOUS | Status: DC
  Administered 2012-04-29 – 2012-04-30 (×2): via INTRAVENOUS
  Filled 2012-04-29 (×3): qty 1000

## 2012-04-29 MED ORDER — OMEGA-3 FATTY ACIDS 1000 MG PO CAPS: 2.0000 g | ORAL_CAPSULE | Freq: Every day | ORAL | Status: DC

## 2012-04-29 MED ORDER — ACETAMINOPHEN 650 MG RE SUPP: 650.0000 mg | Freq: Four times a day (QID) | RECTAL | Status: DC | PRN

## 2012-04-29 MED ORDER — SODIUM CHLORIDE 0.9 % IV SOLN: INTRAVENOUS | Status: DC

## 2012-04-29 MED ORDER — MAGNESIUM CITRATE PO SOLN
1.0000 | Freq: Once | ORAL | Status: AC | PRN
  Filled 2012-04-29: qty 296

## 2012-04-29 MED ORDER — HYDROCODONE-ACETAMINOPHEN 5-325 MG PO TABS: 1.0000 | ORAL_TABLET | Freq: Four times a day (QID) | ORAL | Status: DC | PRN

## 2012-04-29 MED ORDER — SIMVASTATIN 5 MG PO TABS
5.0000 mg | ORAL_TABLET | Freq: Every day | ORAL | Status: DC
  Administered 2012-04-30 – 2012-05-01 (×2): 5 mg via ORAL
  Filled 2012-04-29 (×4): qty 1

## 2012-04-29 MED ORDER — SENNA 8.6 MG PO TABS
1.0000 | ORAL_TABLET | Freq: Two times a day (BID) | ORAL | Status: DC
  Administered 2012-04-30 – 2012-05-01 (×4): 8.6 mg via ORAL
  Filled 2012-04-29 (×6): qty 1

## 2012-04-29 MED ORDER — ENOXAPARIN SODIUM 40 MG/0.4ML ~~LOC~~ SOLN
40.0000 mg | SUBCUTANEOUS | Status: DC
  Administered 2012-04-30 – 2012-05-02 (×3): 40 mg via SUBCUTANEOUS
  Filled 2012-04-29 (×5): qty 0.4

## 2012-04-29 MED ORDER — SENNA-DOCUSATE SODIUM 8.6-50 MG PO TABS: 1.0000 | ORAL_TABLET | Freq: Every day | ORAL | Status: DC

## 2012-04-29 MED ORDER — CEFAZOLIN SODIUM-DEXTROSE 2-3 GM-% IV SOLR
2.0000 g | INTRAVENOUS | Status: AC
  Administered 2012-04-29: 2 g via INTRAVENOUS
  Filled 2012-04-29: qty 50

## 2012-04-29 MED ORDER — PHENOL 1.4 % MT LIQD: 1.0000 | OROMUCOSAL | Status: DC | PRN

## 2012-04-29 MED ORDER — OMEGA-3-ACID ETHYL ESTERS 1 G PO CAPS
1.0000 g | ORAL_CAPSULE | Freq: Every day | ORAL | Status: DC
  Administered 2012-04-30 – 2012-05-02 (×3): 1 g via ORAL
  Filled 2012-04-29 (×4): qty 1

## 2012-04-29 MED ORDER — ROCURONIUM BROMIDE 100 MG/10ML IV SOLN
INTRAVENOUS | Status: DC | PRN
  Administered 2012-04-29: 50 mg via INTRAVENOUS

## 2012-04-29 MED ORDER — ONDANSETRON HCL 4 MG PO TABS: 4.0000 mg | ORAL_TABLET | Freq: Four times a day (QID) | ORAL | Status: DC | PRN

## 2012-04-29 MED ORDER — POLYETHYLENE GLYCOL 3350 17 G PO PACK: 17.0000 g | PACK | Freq: Every day | ORAL | Status: DC | PRN

## 2012-04-29 MED ORDER — ACETAMINOPHEN 325 MG PO TABS: 650.0000 mg | ORAL_TABLET | Freq: Four times a day (QID) | ORAL | Status: DC | PRN

## 2012-04-29 MED ORDER — PHENYLEPHRINE HCL 10 MG/ML IJ SOLN
INTRAMUSCULAR | Status: DC | PRN
  Administered 2012-04-29 (×5): 80 ug via INTRAVENOUS

## 2012-04-29 MED ORDER — CEFAZOLIN SODIUM-DEXTROSE 2-3 GM-% IV SOLR
2.0000 g | Freq: Four times a day (QID) | INTRAVENOUS | Status: AC
  Administered 2012-04-30 (×2): 2 g via INTRAVENOUS
  Filled 2012-04-29 (×7): qty 50

## 2012-04-29 MED ORDER — MENTHOL 3 MG MT LOZG
1.0000 | LOZENGE | OROMUCOSAL | Status: DC | PRN
  Administered 2012-04-30: 3 mg via ORAL
  Filled 2012-04-29: qty 9

## 2012-04-29 MED ORDER — MORPHINE SULFATE 2 MG/ML IJ SOLN: 0.5000 mg | INTRAMUSCULAR | Status: DC | PRN

## 2012-04-29 MED ORDER — WARFARIN SODIUM 5 MG PO TABS: 5.0000 mg | ORAL_TABLET | Freq: Every day | ORAL | Status: DC

## 2012-04-29 MED ORDER — METOCLOPRAMIDE HCL 5 MG/ML IJ SOLN: 5.0000 mg | Freq: Three times a day (TID) | INTRAMUSCULAR | Status: DC | PRN

## 2012-04-29 MED ORDER — HYDROCODONE-ACETAMINOPHEN 5-325 MG PO TABS
1.0000 | ORAL_TABLET | Freq: Four times a day (QID) | ORAL | Status: DC | PRN
  Administered 2012-04-29: 2 via ORAL
  Filled 2012-04-29: qty 2

## 2012-04-29 MED ORDER — WARFARIN - PHARMACIST DOSING INPATIENT
Freq: Every day | Status: DC
  Administered 2012-04-30: 18:00:00

## 2012-04-29 MED ORDER — LIDOCAINE HCL (CARDIAC) 20 MG/ML IV SOLN
INTRAVENOUS | Status: DC | PRN
  Administered 2012-04-29: 30 mg via INTRAVENOUS

## 2012-04-29 MED ORDER — MORPHINE SULFATE 2 MG/ML IJ SOLN
0.5000 mg | INTRAMUSCULAR | Status: DC | PRN
  Administered 2012-04-29 (×5): 0.5 mg via INTRAVENOUS
  Filled 2012-04-29 (×6): qty 1

## 2012-04-29 MED ORDER — ALUM & MAG HYDROXIDE-SIMETH 200-200-20 MG/5ML PO SUSP: 30.0000 mL | ORAL | Status: DC | PRN

## 2012-04-29 MED ORDER — HYDROMORPHONE HCL PF 1 MG/ML IJ SOLN
0.2500 mg | INTRAMUSCULAR | Status: DC | PRN
  Administered 2012-04-29: 0.25 mg via INTRAVENOUS

## 2012-04-29 MED ORDER — LACTATED RINGERS IV SOLN
INTRAVENOUS | Status: DC
  Administered 2012-04-29: 14:00:00 via INTRAVENOUS

## 2012-04-29 MED ORDER — ARTIFICIAL TEARS OP OINT
TOPICAL_OINTMENT | OPHTHALMIC | Status: DC | PRN
  Administered 2012-04-29: 1 via OPHTHALMIC

## 2012-04-29 MED ORDER — PATIENT'S GUIDE TO USING COUMADIN BOOK
Freq: Once | Status: AC
  Administered 2012-04-29: 23:00:00
  Filled 2012-04-29: qty 1

## 2012-04-29 MED ORDER — LACTATED RINGERS IV SOLN
INTRAVENOUS | Status: DC | PRN
  Administered 2012-04-29 (×2): via INTRAVENOUS

## 2012-04-29 MED ORDER — FENTANYL CITRATE 0.05 MG/ML IJ SOLN
INTRAMUSCULAR | Status: DC | PRN
  Administered 2012-04-29 (×2): 50 ug via INTRAVENOUS

## 2012-04-29 MED ORDER — METOCLOPRAMIDE HCL 10 MG PO TABS: 5.0000 mg | ORAL_TABLET | Freq: Three times a day (TID) | ORAL | Status: DC | PRN

## 2012-04-29 MED ORDER — WARFARIN VIDEO: Freq: Once | Status: DC

## 2012-04-29 MED ORDER — GLYCOPYRROLATE 0.2 MG/ML IJ SOLN
INTRAMUSCULAR | Status: DC | PRN
  Administered 2012-04-29: .6 mg via INTRAVENOUS

## 2012-04-29 MED ORDER — LEVOTHYROXINE SODIUM 50 MCG PO TABS
50.0000 ug | ORAL_TABLET | Freq: Every day | ORAL | Status: DC
  Administered 2012-04-29: 50 ug via ORAL
  Filled 2012-04-29 (×2): qty 1

## 2012-04-29 SURGICAL SUPPLY — 64 items
4.5 MM LOCKING SCREW X 10 MM ×2 IMPLANT
APL SKNCLS STERI-STRIP NONHPOA (GAUZE/BANDAGES/DRESSINGS) ×2
BANDAGE ELASTIC 6 VELCRO ST LF (GAUZE/BANDAGES/DRESSINGS) ×2 IMPLANT
BENZOIN TINCTURE PRP APPL 2/3 (GAUZE/BANDAGES/DRESSINGS) ×4 IMPLANT
BIT DRILL 3.2 CALIBRATED (BIT) ×1
BIT DRILL 3.2 QC DISP (BIT) ×2 IMPLANT
BIT DRILL 3.2MM CALIBRATED (BIT) ×1 IMPLANT
BIT DRILL 3.8 CALIBRATED (BIT) ×1
BIT DRILL 3.8MM CALIBRATED (BIT) ×1 IMPLANT
BIT DRILL 4.5 (BIT) ×2 IMPLANT
CLOTH BEACON ORANGE TIMEOUT ST (SAFETY) ×2 IMPLANT
CLSR STERI-STRIP ANTIMIC 1/2X4 (GAUZE/BANDAGES/DRESSINGS) ×6 IMPLANT
COVER SURGICAL LIGHT HANDLE (MISCELLANEOUS) ×2 IMPLANT
DRAPE C-ARM 42X72 X-RAY (DRAPES) ×2 IMPLANT
DRAPE C-ARMOR (DRAPES) ×2 IMPLANT
DRAPE ORTHO SPLIT 77X108 STRL (DRAPES) ×3
DRAPE SURG ORHT 6 SPLT 77X108 (DRAPES) ×3 IMPLANT
DRAPE U-SHAPE 47X51 STRL (DRAPES) ×2 IMPLANT
DRILL BIT 3.2MM CALIBRATED (BIT) ×2
DRILL BIT 3.8MM CALIBRATED (BIT) ×2
DRSG ADAPTIC 3X8 NADH LF (GAUZE/BANDAGES/DRESSINGS) ×2 IMPLANT
DRSG PAD ABDOMINAL 8X10 ST (GAUZE/BANDAGES/DRESSINGS) ×2 IMPLANT
DURAPREP 26ML APPLICATOR (WOUND CARE) ×2 IMPLANT
ELECT REM PT RETURN 9FT ADLT (ELECTROSURGICAL) ×2
ELECTRODE REM PT RTRN 9FT ADLT (ELECTROSURGICAL) ×1 IMPLANT
GLOVE BIOGEL PI IND STRL 6.5 (GLOVE) ×1 IMPLANT
GLOVE BIOGEL PI INDICATOR 6.5 (GLOVE) ×1
GLOVE ORTHO TXT STRL SZ7.5 (GLOVE) ×6 IMPLANT
GLOVE SURG ORTHO 8.0 STRL STRW (GLOVE) ×6 IMPLANT
GLOVE SURG SS PI 6.5 STRL IVOR (GLOVE) ×2 IMPLANT
GOWN PREVENTION PLUS XLARGE (GOWN DISPOSABLE) ×2 IMPLANT
GOWN STRL NON-REIN LRG LVL3 (GOWN DISPOSABLE) ×4 IMPLANT
GUIDEPIN 3.2  ENDO CALB STRL (PIN) ×1
GUIDEPIN 3.2 ENDO CALB STRL (PIN) ×1 IMPLANT
KIT BASIN OR (CUSTOM PROCEDURE TRAY) ×2 IMPLANT
KIT ROOM TURNOVER OR (KITS) ×2 IMPLANT
MANIFOLD NEPTUNE II (INSTRUMENTS) ×2 IMPLANT
NS IRRIG 1000ML POUR BTL (IV SOLUTION) ×2 IMPLANT
PACK TOTAL JOINT (CUSTOM PROCEDURE TRAY) ×2 IMPLANT
PAD ARMBOARD 7.5X6 YLW CONV (MISCELLANEOUS) ×4 IMPLANT
PADDING CAST COTTON 6X4 STRL (CAST SUPPLIES) ×2 IMPLANT
POLYAX FEMORAL PLATE RIGHT 15 HOLE ×2 IMPLANT
SCREW LOCK DIST FEM 5.5X70 (Screw) ×4 IMPLANT
SCREW LOCK PLY DIST FEM 4.5X32 (Screw) ×2 IMPLANT
SCREW LOCK PLY DIST FEM 4.5X36 (Screw) ×2 IMPLANT
SCREW LOCK PLY DIST FEM 4.5X40 (Screw) ×2 IMPLANT
SCREW LOCK PLY PROX TIB 8X75 (Screw) ×2 IMPLANT
SCREW NLOCK CORT 4.5X40 (Screw) ×2 IMPLANT
SCREW NLOCK CORT 4.5X44 (Screw) ×2 IMPLANT
SCREW NLOCK CORT STAR 4.5X36 (Screw) ×2 IMPLANT
SCREW NLOCK CORT STAR 4.5X38 (Screw) ×2 IMPLANT
SCREW NLOCK CORT STAR 4.5X46 (Screw) ×2 IMPLANT
SPONGE GAUZE 4X4 12PLY (GAUZE/BANDAGES/DRESSINGS) ×2 IMPLANT
SUCTION FRAZIER TIP 10 FR DISP (SUCTIONS) ×2 IMPLANT
SUT MNCRL AB 4-0 PS2 18 (SUTURE) ×2 IMPLANT
SUT VIC AB 0 CT1 18XCR BRD 8 (SUTURE) ×1 IMPLANT
SUT VIC AB 0 CT1 8-18 (SUTURE) ×1
SUT VIC AB 0 CTX 18 (SUTURE) ×2 IMPLANT
SUT VIC AB 1 CT1 27 (SUTURE) ×2
SUT VIC AB 1 CT1 27XBRD ANBCTR (SUTURE) ×1 IMPLANT
SUT VIC AB 3-0 SH 8-18 (SUTURE) ×4 IMPLANT
TOWEL OR 17X26 10 PK STRL BLUE (TOWEL DISPOSABLE) ×6 IMPLANT
TUBE CONNECTING 12X1/4 (SUCTIONS) ×2 IMPLANT
YANKAUER SUCT BULB TIP NO VENT (SUCTIONS) ×2 IMPLANT

## 2012-04-29 NOTE — Progress Notes (Signed)
Pt seen and examined, admitted this am per Dr.Kakrakandy with R femur fracture s/p Fall -for ORIF today per Dr.Landau at [facility] -Has pacemaker for SSS since 98, Vitals stable, EKG without acute changes and unchanged from prior 2/14 -Start DVT prophylaxis post op -Transfer to [facility] this am  Zannie Cove, MD [phone]

## 2012-04-29 NOTE — Anesthesia Preprocedure Evaluation (Addendum)
Anesthesia Evaluation  Patient identified by MRN, date of birth, ID band Patient awake    Reviewed: Allergy & Precautions, H&P , NPO status , Patient's Chart, lab work & pertinent test results  Airway Mallampati: I TM Distance: >3 FB Neck ROM: full    Dental   Pulmonary neg pulmonary ROS,          Cardiovascular + dysrhythmias + pacemaker + Valvular Problems/Murmurs Rhythm:regular Rate:Normal     Neuro/Psych negative neurological ROS     GI/Hepatic GERD-  ,  Endo/Other  Hypothyroidism   Renal/GU      Musculoskeletal   Abdominal   Peds  Hematology   Anesthesia Other Findings   Reproductive/Obstetrics                          Anesthesia Physical Anesthesia Plan  ASA: III  Anesthesia Plan: General   Post-op Pain Management:    Induction: Intravenous  Airway Management Planned: Oral ETT  Additional Equipment:   Intra-op Plan:   Post-operative Plan: Extubation in OR  Informed Consent: I have reviewed the patients History and Physical, chart, labs and discussed the procedure including the risks, benefits and alternatives for the proposed anesthesia with the patient or authorized representative who has indicated his/her understanding and acceptance.     Plan Discussed with: CRNA, Anesthesiologist and Surgeon  Anesthesia Plan Comments:         Anesthesia Quick Evaluation

## 2012-04-29 NOTE — Anesthesia Postprocedure Evaluation (Signed)
  Anesthesia Post-op Note  Patient: Robin Atkins  Procedure(s) Performed: Procedure(s) with comments: OPEN REDUCTION INTERNAL FIXATION (ORIF) DISTAL FEMUR FRACTURE (Right) - biomet long condylar plate, flat jackson, cerclage wires, big c arm  Patient Location: PACU  Anesthesia Type:General  Level of Consciousness: awake and alert   Airway and Oxygen Therapy: Patient Spontanous Breathing and Patient connected to nasal cannula oxygen  Post-op Pain: mild  Post-op Assessment: Post-op Vital signs reviewed and Patient's Cardiovascular Status Stable  Post-op Vital Signs: Reviewed and stable  Complications: No apparent anesthesia complications

## 2012-04-29 NOTE — Progress Notes (Signed)
Utilization review completed.  

## 2012-04-29 NOTE — Anesthesia Postprocedure Evaluation (Signed)
Anesthesia Post Note  Patient: Robin Atkins  Procedure(s) Performed: Procedure(s) (LRB): OPEN REDUCTION INTERNAL FIXATION (ORIF) DISTAL FEMUR FRACTURE (Right)  Anesthesia type: general  Patient location: PACU  Post pain: Pain level controlled  Post assessment: Patient's Cardiovascular Status Stable  Last Vitals:  Filed Vitals:   04/29/12 1945  BP: 131/52  Pulse: 59  Temp:   Resp: 14    Post vital signs: Reviewed and stable  Level of consciousness: sedated  Complications: No apparent anesthesia complications

## 2012-04-29 NOTE — Progress Notes (Signed)
Pt only complaint is sore throat started taking ice chips without difficulty, no nausea

## 2012-04-29 NOTE — Anesthesia Postprocedure Evaluation (Signed)
  Anesthesia Post-op Note  Patient: Robin Atkins  Procedure(s) Performed: Procedure(s) with comments: OPEN REDUCTION INTERNAL FIXATION (ORIF) DISTAL FEMUR FRACTURE (Right) - biomet long condylar plate, flat jackson, cerclage wires, big c arm  Patient Location: PACU  Anesthesia Type:General  Level of Consciousness: alert   Airway and Oxygen Therapy: Patient Spontanous Breathing and Patient connected to nasal cannula oxygen  Post-op Pain: mild  Post-op Assessment: Post-op Vital signs reviewed and Patient's Cardiovascular Status Stable  Post-op Vital Signs: Reviewed and stable  Complications: No apparent anesthesia complications

## 2012-04-29 NOTE — Progress Notes (Signed)
I was asked by Dr. Burnell Blanks assist in management for Robin Atkins.  She has a periprosthetic femur fracture that needs plate fixation.  I am planning to have her transferred over to [facility] based on operative room availability, and I have spoken with Dr. Jomarie Longs regarding this. We will plan for surgery later today.  Full consult to follow, and I will discuss the risks benefits and alternatives with the patient in more detail when I see her at [facility]. She should stay n.p.o. for now, and I have spoken to her on the phone already.we will also need to have blood available, and we will plan to type and cross her when she comes to [facility].  Eulas Post, MD

## 2012-04-29 NOTE — Consult Note (Signed)
ORTHOPAEDIC CONSULTATION  REQUESTING PHYSICIAN: Zannie Cove, MD  Chief Complaint: right leg pain  HPI: Robin Atkins is a 79 y.o. female who complains of  Right leg pain, unable to walk, after a fall that occurred last night. This is a mechanical twisting injury. She had acute onset severe pain, was admitted to the hospital, medical preoperative optimization performed, and has elected for surgical management of a right femoral shaft fracture. She denies any other injury. No loss of consciousness. She denies any bleeding from the skin.  Past Medical History  Diagnosis Date  . MVP (mitral valve prolapse)   . Hyperlipidemia   . OP (osteoporosis)   . SSS (sick sinus syndrome)     s/p PTVP  . Hypothyroidism   . Broken foot     Broken right foot and broken left knee in 2011  . S/P cardiac pacemaker procedure     Original implant 1998 with generator change in 2005. She has had prior lead revision.   . Knee cap dislocation 2011    left  . Hx of migraine headaches   . Benign recurrent vertigo   . Diverticulosis   . Shingles   . GERD (gastroesophageal reflux disease)   . Wrist fracture     lt  . Cataracts, bilateral   . Hemorrhoids    Past Surgical History  Procedure Laterality Date  . Total hip arthroplasty      right  . Pacemaker insertion  1998/2005    TRANSVENOUS PACEMAKER  . US echocardiography  11/25/2008    EF 55-60%; mild to moderate AI  . Cardiovascular stress test  07/10/2005    EF 78%, NO ISCHEMIA  . Hemorroidectomy     History   Social History  . Marital Status: Married    Spouse Name: N/A    Number of Children: 2  . Years of Education: N/A   Occupational History  . retired    Social History Main Topics  . Smoking status: Never Smoker   . Smokeless tobacco: Never Used  . Alcohol Use: No  . Drug Use: No  . Sexually Active: None   Other Topics Concern  . None   Social History Narrative  . None   Family History  Problem Relation Age of  Onset  . Heart attack Mother   . Cancer Father     brain tumour  . Lung cancer Brother     x2  . Diabetes Sister     x 2 brothers  . Lymphoma Sister    Allergies  Allergen Reactions  . Prednisone    Prior to Admission medications   Medication Sig Start Date End Date Taking? Authorizing Provider  aspirin 81 MG tablet Take 81 mg by mouth daily.     Yes Historical Provider, MD  calcium carbonate (OS-CAL) 600 MG TABS Take 600 mg by mouth daily.    Yes Historical Provider, MD  cholecalciferol (VITAMIN D) 1000 UNITS tablet Take 1,000 Units by mouth daily.     Yes Historical Provider, MD  fish oil-omega-3 fatty acids 1000 MG capsule Take 2 g by mouth daily.     Yes Historical Provider, MD  levothyroxine (SYNTHROID, LEVOTHROID) 50 MCG tablet Take 50 mcg by mouth daily.     Yes Historical Provider, MD  Multiple Vitamin (MULTIVITAMIN) tablet Take 1 tablet by mouth daily.     Yes Historical Provider, MD  pravastatin (PRAVACHOL) 10 MG tablet Take 20 mg by mouth daily.    Yes  Historical Provider, MD  zoledronic acid (RECLAST) 5 MG/100ML SOLN Inject 5 mg into the vein once. Infuse 5mg  iv once a year in April.  Last 4/12, third and fourth dose 5/13   Yes Historical Provider, MD   Dg Chest 1 View  04/28/2012  *RADIOLOGY REPORT*  Clinical Data: Larey Seat.  Right leg pain.  CHEST - 1 VIEW  Comparison: 06/17/2007.  Findings: The cardiac silhouette, mediastinal and hilar contours are normal.  The lungs are clear.  The pacer wires are stable.  The bony thorax is grossly normal.  IMPRESSION: No acute cardiopulmonary findings.   Original Report Authenticated By: Rudie Meyer, M.D.    Dg Femur Right  04/28/2012  *RADIOLOGY REPORT*  Clinical Data: Mid right femur pain secondary to a fall.  RIGHT FEMUR - 2 VIEW  Comparison: None.  Findings: There is a slightly comminuted slightly displaced slightly angulated spiral fracture of the distal right femoral shaft.  Right total hip prosthesis is properly located.  No other  acute abnormalities.  IMPRESSION: Spiral fracture of the distal right femoral shaft.   Original Report Authenticated By: Francene Boyers, M.D.     Positive ROS: All other systems have been reviewed and were otherwise negative with the exception of those mentioned in the HPI and as above.  Physical Exam: General: Alert, no acute distress Cardiovascular: No pedal edema Respiratory: No cyanosis, no use of accessory musculature GI: No organomegaly, abdomen is soft and non-tender Skin: No lesions in the area of chief complaint, with the exception of some bruising Neurologic: Sensation intact distally Psychiatric: Patient is competent for consent with normal mood and affect Lymphatic: No axillary or cervical lymphadenopathy  MUSCULOSKELETAL: right leg has gross deformity with rotational malalignment. EHL and FHL are firing. She has pain to palpation over the midshaft femur.  Assessment: Right midshaft femur fracture, status post right total hip replacement  Plan: This is an acute severe injury, with risks both of morbidity and mortality. I recommended surgical intervention given her activity level. This would involve plate fixation, and I do think that I will need to span above the distal stem of the prosthesis.  The risks benefits and alternatives were discussed with the patient including but not limited to the risks of nonoperative treatment, versus surgical intervention including infection, bleeding, nerve injury, malunion, nonunion, the need for revision surgery, hardware prominence, hardware failure, the need for hardware removal, blood clots, cardiopulmonary complications, morbidity, mortality, among others, and they were willing to proceed.    She will be bed to wheelchair transfer after surgery for a period of probably 2 months, and we will also plan for Coumadin for DVT prophylaxis, using Lovenox as a bridge.    Eulas Post, MD Cell [phone]   04/29/2012 2:47 PM

## 2012-04-29 NOTE — Op Note (Signed)
04/28/2012 - 04/29/2012  5:55 PM  PATIENT:  Robin Atkins    PRE-OPERATIVE DIAGNOSIS:  right distal femur fracture  POST-OPERATIVE DIAGNOSIS:  Same  PROCEDURE:  OPEN REDUCTION INTERNAL FIXATION (ORIF) DISTAL FEMUR FRACTURE  SURGEON:  Eulas Post, MD  PHYSICIAN ASSISTANT: Janace Litten, OPA-C, present and scrubbed throughout the case, critical for completion in a timely fashion, and for retraction, instrumentation, and closure.  ANESTHESIA:   General  PREOPERATIVE INDICATIONS:  [name] is a  79 y.o. female with a diagnosis of right distal femur fracture who elected for surgical management.  She's had a previous right total hip replacement, and has a femoral stem in place.  The risks benefits and alternatives were discussed with the patient preoperatively including but not limited to the risks of infection, bleeding, nerve injury, cardiopulmonary complications, the need for revision surgery, among others, and the patient was willing to proceed. We also discussed the risk for malunion, nonunion, inability to regain ambulatory function, cardiopulmonary complications, among others.  OPERATIVE IMPLANTS: Biomet condylar precontoured distal locking plate  OPERATIVE FINDINGS: Comminution and poor bone quality  OPERATIVE PROCEDURE: The patient is brought to the operating room and placed in the supine position. General anesthesia was administered. IV antibiotics were given. The right lower extremity was prepped and draped in usual sterile fashion. Time out was performed. Lateral incision was made from the level of the knee joint line up to the level of the previous total hip incision. This was stopped just at the distal portion of the total hip incision. I elevated the subcutaneous tissue, and incised the iliotibial band, and reflected the vastus lateralis anteriorly. The fracture was exposed and adequate hemostasis was obtained. He she did get 2 units of packed red blood cells. I reduced  the fracture anatomically and held with a clamp. There was a posterior segment which I essentially ignored, and I aligned the 2 main segments, and placed an interfragmentary lag screw. This did hold, and helped with the reduction, and then I applied a plate the length of the femur. I secured the plate initially with a distal guidepin in the large locking hole, parallel to the joint, at the appropriate location.  I secured the plate proximally with a clamp, confirmed its alignment on AP and lateral views, and then secured the plate proximally with interlocking cortical screw. I gradually compressed the plate to the bone using cortical screws, achieving excellent contour. I spanned the fracture with plate with both locking and nonlocking screws, and used a unicortical locking screw proximally.  Final C-arm pictures were taken, and I had restored the overall alignment of the femur, and I irrigated the wounds copiously and repaired the fascia with 0 Vicryl followed by 3-0 Vicryl for the subcutaneous tissue with Monocryl and Steri-Strips for the skin. The patient was awakened and sterile gauze applied and she returned to the PACU in stable and satisfactory condition. There no complications and she tolerated procedure well. She will be nonweightbearing for a period of probably 2 months.

## 2012-04-29 NOTE — Anesthesia Procedure Notes (Signed)
Procedure Name: Intubation Date/Time: 04/29/2012 3:41 PM Performed by: Luster Landsberg Pre-anesthesia Checklist: Patient identified, Emergency Drugs available, Suction available and Patient being monitored Patient Re-evaluated:Patient Re-evaluated prior to inductionOxygen Delivery Method: Circle system utilized Preoxygenation: Pre-oxygenation with 100% oxygen Intubation Type: IV induction Ventilation: Mask ventilation without difficulty Laryngoscope Size: Mac and 3 Grade View: Grade II Tube type: Oral Tube size: 7.0 mm Number of attempts: 1 Airway Equipment and Method: Stylet and LTA kit utilized Placement Confirmation: ETT inserted through vocal cords under direct vision,  positive ETCO2 and breath sounds checked- equal and bilateral Secured at: 21 cm Tube secured with: Tape Dental Injury: Teeth and Oropharynx as per pre-operative assessment

## 2012-04-29 NOTE — Progress Notes (Signed)
ANTICOAGULATION CONSULT NOTE - Initial Consult  Pharmacy Consult for Coumadin Indication: VTE prophylaxis  Allergies  Allergen Reactions  . Prednisone     Patient Measurements: Height: 5\' 5"  (165.1 cm) Weight: 145 lb 8.1 oz (66 kg) IBW/kg (Calculated) : 57 Heparin Dosing Weight: 66 kg  Vital Signs: Temp: 97.7 F (36.5 C) (04/15 2017) BP: 141/52 mmHg (04/15 2017) Pulse Rate: 60 (04/15 2017)  Labs:  Recent Labs  04/28/12 2025 04/29/12 0450  HGB 10.5* 9.6*  HCT 31.9* 28.9*  PLT 141* 137*  APTT  --  32  LABPROT 13.1  --   INR 1.00  --   CREATININE 0.91 0.75    Estimated Creatinine Clearance: 46.3 ml/min (by C-G formula based on Cr of 0.75).   Medical History: Past Medical History  Diagnosis Date  . MVP (mitral valve prolapse)   . Hyperlipidemia   . OP (osteoporosis)   . SSS (sick sinus syndrome)     s/p PTVP  . Hypothyroidism   . Broken foot     Broken right foot and broken left knee in 2011  . S/P cardiac pacemaker procedure     Original implant 1998 with generator change in 2005. She has had prior lead revision.   . Knee cap dislocation 2011    left  . Hx of migraine headaches   . Benign recurrent vertigo   . Diverticulosis   . Shingles   . GERD (gastroesophageal reflux disease)   . Wrist fracture     lt  . Cataracts, bilateral   . Hemorrhoids     Medications:  Scheduled:  . [COMPLETED]  ceFAZolin (ANCEF) IV  2 g Intravenous 30 min Pre-Op  .  ceFAZolin (ANCEF) IV  2 g Intravenous Q6H  . docusate sodium  100 mg Oral BID  . [START ON 04/30/2012] enoxaparin (LOVENOX) injection  40 mg Subcutaneous Q24H  . HYDROmorphone      . omega-3 acid ethyl esters  1 g Oral Daily  . senna  1 tablet Oral BID  . simvastatin  5 mg Oral q1800  . [DISCONTINUED] fish oil-omega-3 fatty acids  2 g Oral Daily  . [DISCONTINUED] levothyroxine  50 mcg Oral Daily    Assessment: 79 yo female s/p ORIF of femur fracture.  Pharmacy asked to begin Coumadin for VTE  prophylaxis post-op.  No bleeding or complications noted per chart notes.  No anticoagulants noted PTA.  Baseline INR WNL.  Goal of Therapy:  INR 2-3 Monitor platelets by anticoagulation protocol: Yes   Plan:  1. Coumadin 5 mg po x 1 tonight. 2. Daily PT/INR. 3. Will begin Coumadin education.  Tad Moore, BCPS  Clinical Pharmacist Pager [phone]  04/29/2012 9:07 PM

## 2012-04-29 NOTE — H&P (Signed)
[facility] History and Physical  [name]:[id number] DOB: [birth year] DOA: 04/28/2012  Referring physician: ER physician. PCP: Ezequiel Kayser, MD  Specialists: Dr. [name] cardiologist.  Chief Complaint: Fall with right leg pain.  HPI: Robin Atkins is a 79 y.o. female with history of sick sinus syndrome status post pacemaker placement, hypothyroidism and hyperlipidemia has had a fall yesterday while at her house. Patient states she tripped on something and fell. Did not lose consciousness or have any palpitations or chest pain. Did not hit her head. In the ER x-rays revealed a right femoral shaft fracture and orthopedic surgeon on call was consulted and patient will be admitted for further management.  Review of Systems: As presented in the history of presenting illness, rest negative.  Past Medical History  Diagnosis Date  . MVP (mitral valve prolapse)   . Hyperlipidemia   . OP (osteoporosis)   . SSS (sick sinus syndrome)     s/p PTVP  . Hypothyroidism   . Broken foot     Broken right foot and broken left knee in 2011  . S/P cardiac pacemaker procedure     Original implant 1998 with generator change in 2005. She has had prior lead revision.   . Knee cap dislocation 2011    left  . Hx of migraine headaches   . Benign recurrent vertigo   . Diverticulosis   . Shingles   . GERD (gastroesophageal reflux disease)   . Wrist fracture     lt  . Cataracts, bilateral   . Hemorrhoids    Past Surgical History  Procedure Laterality Date  . Total hip arthroplasty      right  . Pacemaker insertion  1998/2005    TRANSVENOUS PACEMAKER  . US echocardiography  11/25/2008    EF 55-60%; mild to moderate AI  . Cardiovascular stress test  07/10/2005    EF 78%, NO ISCHEMIA  . Hemorroidectomy     Social History:  reports that she has never smoked. She has never used smokeless tobacco. She reports that she does not drink alcohol or use illicit drugs. Lives at home with her  husband. where does patient live-- Can do ADLs. Can patient participate in ADLs?  Allergies  Allergen Reactions  . Prednisone     Family History  Problem Relation Age of Onset  . Heart attack Mother   . Cancer Father     brain tumour  . Lung cancer Brother     x2  . Diabetes Sister     x 2 brothers  . Lymphoma Sister       Prior to Admission medications   Medication Sig Start Date End Date Taking? Authorizing Provider  aspirin 81 MG tablet Take 81 mg by mouth daily.     Yes Historical Provider, MD  calcium carbonate (OS-CAL) 600 MG TABS Take 600 mg by mouth daily.    Yes Historical Provider, MD  cholecalciferol (VITAMIN D) 1000 UNITS tablet Take 1,000 Units by mouth daily.     Yes Historical Provider, MD  fish oil-omega-3 fatty acids 1000 MG capsule Take 2 g by mouth daily.     Yes Historical Provider, MD  levothyroxine (SYNTHROID, LEVOTHROID) 50 MCG tablet Take 50 mcg by mouth daily.     Yes Historical Provider, MD  Multiple Vitamin (MULTIVITAMIN) tablet Take 1 tablet by mouth daily.     Yes Historical Provider, MD  pravastatin (PRAVACHOL) 10 MG tablet Take 20 mg by mouth daily.  Yes Historical Provider, MD  zoledronic acid (RECLAST) 5 MG/100ML SOLN Inject 5 mg into the vein once. Infuse 5mg  iv once a year in April.  Last 4/12, third and fourth dose 5/13   Yes Historical Provider, MD   Physical Exam: Filed Vitals:   04/28/12 1955 04/29/12 0033 04/29/12 0151  BP: 144/84 122/74 114/63  Pulse: 62 60 62  Temp: 98.1 F (36.7 C) 98.2 F (36.8 C) 98 F (36.7 C)  TempSrc: Oral Oral   Resp: 16 18 16   SpO2: 98% 100% 100%     General:  Well-developed well-nourished.  Eyes: Anicteric no pallor.  ENT: No discharge from the ears eyes nose mouth.  Neck: No mass felt.  Cardiovascular: S1-S2 heard.  Respiratory: No rhonchi or crepitations.  Abdomen: Soft nontender bowel sounds present.  Skin: No rash.  Musculoskeletal: Pain on moving her right lower  extremity.  Psychiatric: Appears normal.  Neurologic: Alert and oriented to time place and person. Moves all extremities.  Labs on Admission:  Basic Metabolic Panel:  Recent Labs Lab 04/28/12 2025  NA 136  K 4.2  CL 101  CO2 28  GLUCOSE 112*  BUN 17  CREATININE 0.91  CALCIUM 8.8   Liver Function Tests: No results found for this basename: AST, ALT, ALKPHOS, BILITOT, PROT, ALBUMIN,  in the last 168 hours No results found for this basename: LIPASE, AMYLASE,  in the last 168 hours No results found for this basename: AMMONIA,  in the last 168 hours CBC:  Recent Labs Lab 04/28/12 2025  WBC 7.4  NEUTROABS 5.3  HGB 10.5*  HCT 31.9*  MCV 88.4  PLT 141*   Cardiac Enzymes: No results found for this basename: CKTOTAL, CKMB, CKMBINDEX, TROPONINI,  in the last 168 hours  BNP (last 3 results) No results found for this basename: PROBNP,  in the last 8760 hours CBG: No results found for this basename: GLUCAP,  in the last 168 hours  Radiological Exams on Admission: Dg Chest 1 View  04/28/2012  *RADIOLOGY REPORT*  Clinical Data: Larey Seat.  Right leg pain.  CHEST - 1 VIEW  Comparison: 06/17/2007.  Findings: The cardiac silhouette, mediastinal and hilar contours are normal.  The lungs are clear.  The pacer wires are stable.  The bony thorax is grossly normal.  IMPRESSION: No acute cardiopulmonary findings.   Original Report Authenticated By: Rudie Meyer, M.D.    Dg Femur Right  04/28/2012  *RADIOLOGY REPORT*  Clinical Data: Mid right femur pain secondary to a fall.  RIGHT FEMUR - 2 VIEW  Comparison: None.  Findings: There is a slightly comminuted slightly displaced slightly angulated spiral fracture of the distal right femoral shaft.  Right total hip prosthesis is properly located.  No other acute abnormalities.  IMPRESSION: Spiral fracture of the distal right femoral shaft.   Original Report Authenticated By: Francene Boyers, M.D.      Assessment/Plan Principal Problem:   Fracture,  femur, shaft Active Problems:   SICK SINUS SYNDROME   Unspecified hypothyroidism   Other and unspecified hyperlipidemia   1. Right femoral shaft fracture status post mechanical fall - patient will be kept n.p.o. in anticipation of possible surgery. Continue with pain relief medications. Further recommendations per orthopedic surgery. 2. History of sick sinus syndrome status post pacemaker placement - as per the nurse the EKG done in the ER was showing paced rhythm but I was not able to get a copy so I have ordered an EKG. Please follow EKG. Patient's fall was mechanical  and presently asymptomatic. 3. Hypothyroidism - continue Synthroid. Check TSH. 4. Hyperlipidemia - continue present medications.    Code Status: Full code.  Family Communication: None.  Disposition Plan: Admit to inpatient.    KAKRAKANDY,ARSHAD N. [facility] Pager [phone].  If 7PM-7AM, please contact night-coverage [email or web address] Password [email or web address] 04/29/2012, 2:03 AM

## 2012-04-29 NOTE — Transfer of Care (Signed)
Immediate Anesthesia Transfer of Care Note  Patient: Robin Atkins  Procedure(s) Performed: Procedure(s) with comments: OPEN REDUCTION INTERNAL FIXATION (ORIF) DISTAL FEMUR FRACTURE (Right) - biomet long condylar plate, flat jackson, cerclage wires, big c arm  Patient Location: PACU  Anesthesia Type:General  Level of Consciousness: awake and alert   Airway & Oxygen Therapy: Patient Spontanous Breathing and Patient connected to nasal cannula oxygen  Post-op Assessment: Report given to PACU RN and Post -op Vital signs reviewed and stable  Post vital signs: Reviewed  Complications: No apparent anesthesia complications

## 2012-04-30 DIAGNOSIS — D62 Acute posthemorrhagic anemia: Secondary | ICD-10-CM

## 2012-04-30 LAB — BASIC METABOLIC PANEL
BUN: 11 mg/dL (ref 6–23)
Calcium: 7.9 mg/dL — ABNORMAL LOW (ref 8.4–10.5)
Chloride: 103 mEq/L (ref 96–112)
Creatinine, Ser: 0.72 mg/dL (ref 0.50–1.10)
GFR calc Af Amer: 88 mL/min — ABNORMAL LOW (ref >90)

## 2012-04-30 LAB — CBC
HCT: 24.9 % — ABNORMAL LOW (ref 36.0–46.0)
MCH: 28.7 pg (ref 26.0–34.0)
MCV: 83 fL (ref 78.0–100.0)
RDW: 15.8 % — ABNORMAL HIGH (ref 11.5–15.5)
WBC: 7.3 10*3/uL (ref 4.0–10.5)

## 2012-04-30 LAB — URINE CULTURE: Culture: NO GROWTH

## 2012-04-30 MED ORDER — WARFARIN SODIUM 5 MG PO TABS
5.0000 mg | ORAL_TABLET | Freq: Once | ORAL | Status: AC
  Administered 2012-04-30: 5 mg via ORAL
  Filled 2012-04-30: qty 1

## 2012-04-30 NOTE — Progress Notes (Signed)
Patient ID: Robin Atkins, female   DOB: May 23, 1924, 79 y.o.   MRN: [id number]     Subjective:  Patient reports pain as mild to moderate.  She states that she is doing well considering the injury and the having surgery.  Objective:   VITALS:   Filed Vitals:   04/29/12 2017 04/29/12 2149 04/30/12 0200 04/30/12 0541  BP: 141/52 132/56 122/50 116/46  Pulse: 60 59 61 57  Temp: 97.7 F (36.5 C) 98.2 F (36.8 C) 98.9 F (37.2 C) 98.6 F (37 C)  TempSrc:      Resp: 16 16 18 18   Height:      Weight:      SpO2: 100% 100% 100% 99%    ABD soft Sensation intact distally Dorsiflexion/Plantar flexion intact Incision: dressing C/D/I and no drainage   Lab Results  Component Value Date   WBC 7.3 04/30/2012   HGB 8.6* 04/30/2012   HCT 24.9* 04/30/2012   MCV 83.0 04/30/2012   PLT 89* 04/30/2012     Assessment/Plan: 1 Day Post-Op   Principal Problem:   Fracture, femur, shaft Active Problems:   SICK SINUS SYNDROME   Unspecified hypothyroidism   Other and unspecified hyperlipidemia   Advance diet Up with therapy Continue foley due to blood transfusion; will continue until blood transfusion complete ABLA May plan to transfuse 2 units of prbc's   Haskel Khan 04/30/2012, 8:24 AM   Teryl Lucy, MD Cell [phone]

## 2012-04-30 NOTE — OR Nursing (Signed)
Late entry on 04-30-12 AT 10:24 by Timoteo Expose, RN to change a supply charge.

## 2012-04-30 NOTE — Progress Notes (Signed)
[facility] PROGRESS NOTE  Robin Atkins ZOX:[id number] DOB: Mar 22, 1924 DOA: 04/28/2012 PCP: Ezequiel Kayser, MD  Assessment/Plan: 1. Right femoral shaft fracture status post mechanical fall - per ortho service. S/P ORIF. anticoagulation and activity to be determine by orthopedic service. Continue PRN pain meds and bowel regimen. PT to see patient later today. 2. ABLA: will transfuse 2 units of PRBC's and follow Hgb trend 3. History of sick sinus syndrome status post pacemaker placement - no acute ischemic changes; paced rhythm an no CP. 4. Hypothyroidism - continue Synthroid. TSH WNL 5. Hyperlipidemia - continue statins.  DVT: coumadin.  Code Status: Full Family Communication: no family members at bedside Disposition Plan: will need SNF at discharge   Consultants:  orthopedica service  Procedures:  Right ORIF distal femur fracture  Antibiotics:  Ancef prophylatically  HPI/Subjective: Afebrile, complaining of pain on her hip; Hgb 8.6  Objective: Filed Vitals:   04/30/12 1600 04/30/12 1700 04/30/12 1730 04/30/12 2140  BP: 121/47 87/47 85/48  111/53  Pulse: 60 61 61 64  Temp: 100.7 F (38.2 C) 100.4 F (38 C) 100.1 F (37.8 C) 100.3 F (37.9 C)  TempSrc:      Resp: 16 18 18 18   Height:      Weight:      SpO2: 97%   94%    Intake/Output Summary (Last 24 hours) at 04/30/12 2208 Last data filed at 04/30/12 1700  Gross per 24 hour  Intake  387.5 ml  Output   1200 ml  Net -812.5 ml   Filed Weights   04/29/12 0420  Weight: 66 kg (145 lb 8.1 oz)    Exam:   General:  NAD, complaining of pain on her hip  Cardiovascular: S1 and S2, no rubs or gallops  Respiratory: CTA bilaterally  Abdomen: soft, NT, ND, positive BS  Musculoskeletal: no edema, RLE with immobilizer; complaining of pain with movement.   Data Reviewed: Basic Metabolic Panel:  Recent Labs Lab 04/28/12 2025 04/29/12 0450 04/29/12 2059 04/30/12 0538  NA 136 135  --  134*  K 4.2  4.3  --  4.0  CL 101 103  --  103  CO2 28 26  --  25  GLUCOSE 112* 132*  --  100*  BUN 17 14  --  11  CREATININE 0.91 0.75 0.65 0.72  CALCIUM 8.8 8.6  --  7.9*   Liver Function Tests:  Recent Labs Lab 04/29/12 0450  ALBUMIN 3.0*   CBC:  Recent Labs Lab 04/28/12 2025 04/29/12 0450 04/29/12 2059 04/30/12 0538  WBC 7.4 9.4 10.8* 7.3  NEUTROABS 5.3  --   --   --   HGB 10.5* 9.6* 9.9* 8.6*  HCT 31.9* 28.9* 28.0* 24.9*  MCV 88.4 88.4 83.1 83.0  PLT 141* 137* 82* 89*    Recent Results (from the past 240 hour(s))  URINE CULTURE     Status: None   Collection Time    04/29/12 10:24 AM      Result Value Range Status   Specimen Description URINE, CATHETERIZED   Final   Special Requests NONE   Final   Culture  Setup Time 04/29/2012 15:07   Final   Colony Count NO GROWTH   Final   Culture NO GROWTH   Final   Report Status 04/30/2012 FINAL   Final  SURGICAL PCR SCREEN     Status: None   Collection Time    04/29/12 10:30 AM      Result Value Range Status  MRSA, PCR NEGATIVE  NEGATIVE Final   Staphylococcus aureus NEGATIVE  NEGATIVE Final   Comment:            The Xpert SA Assay (FDA     approved for NASAL specimens     in patients over 72 years of age),     is one component of     a comprehensive surveillance     program.  Test performance has     been validated by [employer] for patients greater     than or equal to 41 year old.     It is not intended     to diagnose infection nor to     guide or monitor treatment.     Studies: Dg Femur Right  04/29/2012  *RADIOLOGY REPORT*  Clinical Data: Right femur fracture.  DG C-ARM 61-120 MIN,RIGHT FEMUR - 2 VIEW  Comparison: Radiographs dated 04/29/2047  Findings: The patient has undergone open reduction and internal fixation of the fracture of the distal femoral shaft.  Side plate and multiple fixation screws are in place.  There is near anatomic alignment and position of the fracture fragments.  IMPRESSION: Open  reduction and internal fixation of distal right femur fracture.   Original Report Authenticated By: Francene Boyers, M.D.    Dg Femur Right Port  04/29/2012  *RADIOLOGY REPORT*  Clinical Data: Right femur fracture.  PORTABLE RIGHT FEMUR - 2 VIEW  Comparison: Radiographs dated 04/15 and 04/28/2012  Findings: The patient has undergone open reduction and internal fixation of the spiral fracture of the distal right femur.  Side plate and fixation screws in place.  Near anatomic alignment and position at the fracture.  IMPRESSION: Open reduction and internal fixation of distal femoral shaft fracture.   Original Report Authenticated By: Francene Boyers, M.D.    Dg C-arm [id number] Min  04/29/2012  *RADIOLOGY REPORT*  Clinical Data: Right femur fracture.  DG C-ARM 61-120 MIN,RIGHT FEMUR - 2 VIEW  Comparison: Radiographs dated 04/29/2047  Findings: The patient has undergone open reduction and internal fixation of the fracture of the distal femoral shaft.  Side plate and multiple fixation screws are in place.  There is near anatomic alignment and position of the fracture fragments.  IMPRESSION: Open reduction and internal fixation of distal right femur fracture.   Original Report Authenticated By: Francene Boyers, M.D.     Scheduled Meds: . docusate sodium  100 mg Oral BID  . enoxaparin (LOVENOX) injection  40 mg Subcutaneous Q24H  . omega-3 acid ethyl esters  1 g Oral Daily  . senna  1 tablet Oral BID  . simvastatin  5 mg Oral q1800  . warfarin   Does not apply Once  . Warfarin - Pharmacist Dosing Inpatient   Does not apply q1800   Continuous Infusions: . 0.45 % NaCl with KCl 20 mEq / L 75 mL/hr at 04/29/12 2307    Principal Problem:   Fracture, femur, shaft Active Problems:   SICK SINUS SYNDROME   Unspecified hypothyroidism   Other and unspecified hyperlipidemia   Time spent: >30 minutes    Rossi Silvestro  [facility] Pager [phone]. If 7PM-7AM, please contact night-coverage at  [email or web address], password [email or web address] 04/30/2012, 10:08 PM  LOS: 2 days

## 2012-04-30 NOTE — Evaluation (Signed)
Physical Therapy Evaluation Patient Details Name: Robin Atkins MRN: [id number] DOB: [birth year] Today's Date: 04/30/2012 Time: 7829-5621 PT Time Calculation (min): 25 min  PT Assessment / Plan / Recommendation Clinical Impression  Pt presents with R femur fx with impairments in mobility, transfers and activity tolerance.  Pt will benefit from skilled PT services to address deficits and increase functional independence.    PT Assessment  Patient needs continued PT services    Follow Up Recommendations  SNF    Does the patient have the potential to tolerate intense rehabilitation      Barriers to Discharge None      Equipment Recommendations       Recommendations for Other Services     Frequency Min 5X/week    Precautions / Restrictions Precautions Precautions: Fall Required Braces or Orthoses: Knee Immobilizer - Right Restrictions RLE Weight Bearing: Non weight bearing   Pertinent Vitals/Pain Pt c/o 8/10 pain in R leg, RN aware, repositioned.      Mobility  Bed Mobility Bed Mobility: Supine to Sit Supine to Sit: 4: Min assist Details for Bed Mobility Assistance: Assist to move R LE, lifting assistance at trunk, increased time due to pain Transfers Transfers: Stand Pivot Transfers Stand Pivot Transfers: 2: Max assist Details for Transfer Assistance: Used SW to transfer to recliner, able to stand with max A, requires max - total A to pivot to transfer, pt able to perform with cues to keep R LE NWB.  Pt limited by pain.  May benefit from scoot transfer or squat pivot to recliner with armrest that lowers    Exercises     PT Diagnosis: Generalized weakness;Acute pain;Difficulty walking  PT Problem List: Decreased strength;Decreased activity tolerance;Decreased balance;Decreased mobility;Pain PT Treatment Interventions: DME instruction;Therapeutic exercise;Wheelchair mobility training;Balance training;Manual techniques;Modalities;Neuromuscular re-education;Functional  mobility training;Therapeutic activities;Patient/family education   PT Goals Acute Rehab PT Goals PT Goal Formulation: With patient Time For Goal Achievement: 05/07/12 Potential to Achieve Goals: Good Pt will go Supine/Side to Sit: with supervision Pt will Transfer Bed to Chair/Chair to Bed: with min assist Pt will Perform Home Exercise Program: with supervision, verbal cues required/provided  Visit Information  Last PT Received On: 04/30/12 Assistance Needed: +1    Subjective Data  Subjective: I don't want to stay in bed Patient Stated Goal: Feel better   Prior Functioning  Home Living Lives With: Spouse Available Help at Discharge: Family Type of Home: House Home Access: Stairs to enter Secretary/administrator of Steps: 2 Home Layout: Able to live on main level with bedroom/bathroom Home Adaptive Equipment: None Prior Function Level of Independence: Independent Able to Take Stairs?: Yes    Cognition  Cognition Arousal/Alertness: Awake/alert Behavior During Therapy: WFL for tasks assessed/performed Overall Cognitive Status: Within Functional Limits for tasks assessed    Extremity/Trunk Assessment Right Lower Extremity Assessment RLE ROM/Strength/Tone: Due to pain;Due to precautions (ROM limited by R KI, pain) Left Lower Extremity Assessment LLE ROM/Strength/Tone: Within functional levels Trunk Assessment Trunk Assessment: Normal   Balance Static Sitting Balance Static Sitting - Balance Support: Bilateral upper extremity supported Static Sitting - Level of Assistance: 5: Stand by assistance Dynamic Standing Balance Dynamic Standing - Balance Support: During functional activity;Bilateral upper extremity supported Dynamic Standing - Level of Assistance: 2: Max assist  End of Session PT - End of Session Equipment Utilized During Treatment: Gait belt;Right knee immobilizer Activity Tolerance: Patient limited by pain Patient left: in chair;with family/visitor  present;with call bell/phone within reach Nurse Communication: Mobility status  GP  Silvia Markuson 04/30/2012, 1:12 PM

## 2012-04-30 NOTE — Clinical Social Work Placement (Addendum)
Clinical Social Work Department  CLINICAL SOCIAL WORK PLACEMENT NOTE  04/30/2012  Patient: Robin Atkins Account Number: [id number] Admit date: 04/29/12  Clinical Social Worker: Sabino Niemann MSW Date/time: 04/30/2012 10:30 AM  Clinical Social Work is seeking post-discharge placement for this patient at the following level of care: SKILLED NURSING (*CSW will update this form in Epic as items are completed)  04/30/2012 Patient/family provided with [facility] Department of Clinical Social Work's list of facilities offering this level of care within the geographic area requested by the patient (or if unable, by the patient's family).  04/30/2012 Patient/family informed of their freedom to choose among providers that offer the needed level of care, that participate in Medicare, Medicaid or managed care program needed by the patient, have an available bed and are willing to accept the patient.  4/16/2014Patient/family informed of [facility]' ownership interest in [facility], as well as of the fact that they are under no obligation to receive care at this facility.  PASARR submitted to EDS on 04/30/12 PASARR number received from EDS on 04/30/12 FL2 transmitted to all facilities in geographic area requested by pt/family on 04/30/2012  FL2 transmitted to all facilities within larger geographic area on  Patient informed that his/her managed care company has contracts with or will negotiate with certain facilities, including the following:  Patient/family informed of bed offers received: 04/30/12 Patient chooses bed at [facility] Physician recommends and patient chooses bed at  Patient to be transferred to on 05/02/2012 Patient to be transferred to facility by [facility] The following physician request were entered in Epic:  Additional Comments:  .

## 2012-04-30 NOTE — Progress Notes (Signed)
ANTICOAGULATION CONSULT NOTE  Pharmacy Consult for Coumadin Indication: VTE prophylaxis  Allergies  Allergen Reactions  . Prednisone    Labs:  Recent Labs  04/28/12 2025 04/29/12 0450 04/29/12 2059 04/30/12 0538  HGB 10.5* 9.6* 9.9* 8.6*  HCT 31.9* 28.9* 28.0* 24.9*  PLT 141* 137* 82* 89*  APTT  --  32  --   --   LABPROT 13.1  --   --  14.7  INR 1.00  --   --  1.17  CREATININE 0.91 0.75 0.65 0.72    Estimated Creatinine Clearance: 46.3 ml/min (by C-G formula based on Cr of 0.72).   Assessment: 79 yo female s/p ORIF of femur fracture.  Pharmacy asked to begin Coumadin for VTE prophylaxis post-op.  No bleeding or complications noted per chart notes.  No anticoagulants noted PTA.  Baseline INR WNL.  INR today = 1.17, 2 units PRBCs today  Goal of Therapy:  INR 2-3 Monitor platelets by anticoagulation protocol: Yes   Plan:  1. Repeat Coumadin 5 mg po x 1 tonight. 2. Daily PT/INR.  Thank you. Okey Regal, PharmD [phone]   04/30/2012 8:45 AM

## 2012-05-01 ENCOUNTER — Encounter (HOSPITAL_COMMUNITY): Payer: Self-pay | Admitting: Orthopedic Surgery

## 2012-05-01 LAB — TYPE AND SCREEN
Unit division: 0
Unit division: 0

## 2012-05-01 LAB — CBC
Hemoglobin: 11.1 g/dL — ABNORMAL LOW (ref 12.0–15.0)
MCH: 29.8 pg (ref 26.0–34.0)
MCV: 82.5 fL (ref 78.0–100.0)
RBC: 3.72 MIL/uL — ABNORMAL LOW (ref 3.87–5.11)

## 2012-05-01 LAB — BASIC METABOLIC PANEL
CO2: 25 mEq/L (ref 19–32)
Calcium: 8.3 mg/dL — ABNORMAL LOW (ref 8.4–10.5)
Creatinine, Ser: 0.7 mg/dL (ref 0.50–1.10)
Glucose, Bld: 103 mg/dL — ABNORMAL HIGH (ref 70–99)

## 2012-05-01 MED ORDER — LEVOTHYROXINE SODIUM 50 MCG PO TABS
50.0000 ug | ORAL_TABLET | Freq: Every day | ORAL | Status: DC
  Administered 2012-05-02: 50 ug via ORAL
  Filled 2012-05-01 (×2): qty 1

## 2012-05-01 MED ORDER — SENNOSIDES-DOCUSATE SODIUM 8.6-50 MG PO TABS
1.0000 | ORAL_TABLET | Freq: Two times a day (BID) | ORAL | Status: DC
  Administered 2012-05-01 – 2012-05-02 (×2): 1 via ORAL
  Filled 2012-05-01 (×2): qty 1

## 2012-05-01 MED ORDER — WARFARIN SODIUM 5 MG PO TABS
5.0000 mg | ORAL_TABLET | Freq: Once | ORAL | Status: AC
  Administered 2012-05-01: 5 mg via ORAL
  Filled 2012-05-01: qty 1

## 2012-05-01 NOTE — Clinical Social Work Psychosocial (Signed)
Clinical Social Work Department  BRIEF PSYCHOSOCIAL ASSESSMENT  Patient: Robin Atkins  Account Number: [id number] Admit date: 04/28/12 Clinical Social Worker Sabino Niemann, MSW Date/Time: 04/30/12 Referred by: Physician Date Referred: 04/29/12 Referred for   SNF Placement   Other Referral:  Interview type: Patient and patient's husband Other interview type: PSYCHOSOCIAL DATA  Living Status:Husband Admitted from facility:  Level of care:  Primary support name: Lillard Anes Primary support relationship to patient: Husband Degree of support available:  Strong and vested  CURRENT CONCERNS  Current Concerns   Post-Acute Placement   Other Concerns:  SOCIAL WORK ASSESSMENT / PLAN  CSW met with pt and patient's husband re: PT recommendation for SNF.   Pt lives with her husband  CSW explained placement process and answered questions.   Pt reports no preference at this time    CSW completed FL2 and initiated SNF search.     Assessment/plan status: Information/Referral to [facility]  Other assessment/ plan:  Information/referral to community resources:  SNF   PTAR  PATIENT'S/FAMILY'S RESPONSE TO PLAN OF CARE:  Pt and patient's husband  Report they are agreeable to ST SNF in order for the patient to increase strength and independence with mobility prior to returning home  Pt verbalized understanding of placement process and appreciation for CSW assist. CSW will provide bed offers and will continue to follow  Sabino Niemann, MSW [phone]

## 2012-05-01 NOTE — Progress Notes (Signed)
     Subjective:  Patient reports pain as moderate.  Otherwise doing well, but slow to mobilize with therapy.  Objective:   VITALS:   Filed Vitals:   04/30/12 1700 04/30/12 1730 04/30/12 2140 05/01/12 0542  BP: 87/47 85/48 111/53 121/48  Pulse: 61 61 64 60  Temp: 100.4 F (38 C) 100.1 F (37.8 C) 100.3 F (37.9 C) 100.3 F (37.9 C)  TempSrc:      Resp: 18 18 18 18   Height:      Weight:      SpO2:   94% 95%    Knee immobilizer is intact. EHL and FHL are firing. Sensation is intact distally.   Lab Results  Component Value Date   WBC 9.9 05/01/2012   HGB 11.1* 05/01/2012   HCT 30.7* 05/01/2012   MCV 82.5 05/01/2012   PLT 79* 05/01/2012     Assessment/Plan: 2 Days Post-Op   Principal Problem:   Fracture, femur, shaft Active Problems:   SICK SINUS SYNDROME   Unspecified hypothyroidism   Other and unspecified hyperlipidemia  acute blood loss anemia, status post blood transfusion  She is overall doing well, and her hemoglobin has improved. Continue nonweightbearing. Okay for anticoagulation with Coumadin. Return to clinic in 2 weeks with me. Plan for skilled nursing per primary team.   Emmalin Jaquess P 05/01/2012, 7:48 AM   Teryl Lucy, MD Cell [phone]

## 2012-05-01 NOTE — Evaluation (Signed)
Occupational Therapy Evaluation Patient Details Name: Robin Atkins MRN: [id number] DOB: 1924/07/09 Today's Date: 05/01/2012 Time: 1478-2956 OT Time Calculation (min): 28 min  OT Assessment / Plan / Recommendation Clinical Impression    pt presents with R femur fx s/p ORIF. pt fearful of pain and falling, but listens to cueing well. +2 Total A for LB ADLs.  pt will continue to need SNF at D/C. OT not setting goals at this time due to plan to d/c to snf. Will update goals if d/c status changes.      OT Assessment  Patient needs continued OT Services    Follow Up Recommendations  SNF    Barriers to Discharge None    Equipment Recommendations  Other (comment) (defer to SNF)    Recommendations for Other Services    Frequency       Precautions / Restrictions Precautions Precautions: Fall Required Braces or Orthoses: Knee Immobilizer - Right Knee Immobilizer - Right: On at all times Restrictions Weight Bearing Restrictions: Yes RLE Weight Bearing: Non weight bearing   Pertinent Vitals/Pain Indicates pain, but states she already had pain meds.     ADL  Eating/Feeding: Independent Where Assessed - Eating/Feeding: Chair Grooming: Set up Where Assessed - Grooming: Supported sitting Upper Body Bathing: Set up Where Assessed - Upper Body Bathing: Supported sitting Lower Body Bathing: +2 Total assistance Lower Body Bathing: Patient Percentage: 30%  Where Assessed - Lower Body Bathing: Supported sit to stand Upper Body Dressing: Set up Where Assessed - Upper Body Dressing: Supported sitting Lower Body Dressing: +2 Total assistance Lower Body Dressing: Patient Percentage: 0% (30% sit to stand) Where Assessed - Lower Body Dressing: Supported sit to Pharmacist, hospital: +2 Total assistance Toilet Transfer: Patient Percentage: 30% Statistician Method: Sit to Barista: Raised toilet seat with arms (or 3-in-1 over toilet) Toileting - Clothing  Manipulation and Hygiene: +2 Total assistance Toileting - Clothing Manipulation and Hygiene: Patient Percentage: 30% Where Assessed - Toileting Clothing Manipulation and Hygiene: Sit to stand from 3-in-1 or toilet Tub/Shower Transfer Method: Not assessed Equipment Used: Gait belt;Knee Immobilizer ADL Comments: Pt at +2 Total A for LB ADLs.     OT Diagnosis: Acute pain  OT Problem List: Decreased strength;Decreased range of motion;Decreased activity tolerance;Impaired balance (sitting and/or standing);Decreased knowledge of use of DME or AE;Decreased knowledge of precautions;Pain OT Treatment Interventions: Self-care/ADL training;DME and/or AE instruction;Patient/family education;Balance training;Therapeutic activities   OT Goals    Visit Information  Last OT Received On: 05/01/12 Assistance Needed: +2 PT/OT Co-Evaluation/Treatment: Yes    Subjective Data      Prior Functioning     Home Living Lives With: Spouse Available Help at Discharge: Family Type of Home: House Home Access: Stairs to enter Secretary/administrator of Steps: 2 Home Layout: Able to live on main level with bedroom/bathroom Home Adaptive Equipment: None Prior Function Level of Independence: Independent Able to Take Stairs?: Yes Communication Communication: No difficulties         Vision/Perception     Cognition  Cognition Arousal/Alertness: Awake/alert Behavior During Therapy: WFL for tasks assessed/performed Overall Cognitive Status: Within Functional Limits for tasks assessed    Extremity/Trunk Assessment Right Upper Extremity Assessment RUE ROM/Strength/Tone: [facility] for tasks assessed Left Upper Extremity Assessment LUE ROM/Strength/Tone: [facility] for tasks assessed     Mobility Bed Mobility Bed Mobility: Supine to Sit;Sitting - Scoot to Edge of Bed Supine to Sit: 3: Mod assist Sitting - Scoot to Edge of Bed: 3: Mod assist Details  for Bed Mobility Assistance: A with R LE and use of pad to  bring hips towards EOB.   Transfers Transfers: Sit to Stand;Stand to Sit Sit to Stand: 1: +2 Total assist;With upper extremity assist;From bed Sit to Stand: Patient Percentage: 30% Stand to Sit: 1: +2 Total assist;With upper extremity assist;To chair/3-in-1;With armrests Stand to Sit: Patient Percentage: 30% Details for Transfer Assistance: cues for UE use, pt's R foot on PT's foot, step by step cueing through transfer.       Exercise     Balance Balance Balance Assessed: No   End of Session OT - End of Session Equipment Utilized During Treatment: Gait belt;Right knee immobilizer Activity Tolerance: Patient limited by pain Patient left: in chair;with call bell/phone within reach  GO     Earlie Raveling OTR/L [phone] 05/01/2012, 3:50 PM

## 2012-05-01 NOTE — Progress Notes (Signed)
ANTICOAGULATION CONSULT NOTE  Pharmacy Consult for Coumadin Indication: VTE prophylaxis  Allergies  Allergen Reactions  . Prednisone    Labs:  Recent Labs  04/28/12 2025 04/29/12 0450 04/29/12 2059 04/30/12 0538 05/01/12 0437  HGB 10.5* 9.6* 9.9* 8.6* 11.1*  HCT 31.9* 28.9* 28.0* 24.9* 30.7*  PLT 141* 137* 82* 89* 79*  APTT  --  32  --   --   --   LABPROT 13.1  --   --  14.7 17.4*  INR 1.00  --   --  1.17 1.47  CREATININE 0.91 0.75 0.65 0.72 0.70    Estimated Creatinine Clearance: 46.3 ml/min (by C-G formula based on Cr of 0.7).   Assessment: 79 yo female s/p ORIF of femur fracture.  Pharmacy asked to begin Coumadin for VTE prophylaxis post-op.  No bleeding or complications noted per chart notes.  No anticoagulants noted PTA.  Baseline INR WNL.  INR today = 1.47  Goal of Therapy:  INR 2-3 Monitor platelets by anticoagulation protocol: Yes   Plan:  1. Repeat Coumadin 5 mg po x 1 tonight. 2. Daily PT/INR.  Thank you. Okey Regal, PharmD [phone]   05/01/2012 11:23 AM

## 2012-05-01 NOTE — Progress Notes (Signed)
Physical Therapy Treatment Patient Details Name: Robin Atkins MRN: [id number] DOB: January 12, 1925 Today's Date: 05/01/2012 Time: 0102-7253 PT Time Calculation (min): 28 min  PT Assessment / Plan / Recommendation Comments on Treatment Session  pt presents with R femur fx s/p ORIF.  pt fearful of pain and falling, but listens to cueing well.  pt will continue to need SNF at D/C.      Follow Up Recommendations  SNF     Does the patient have the potential to tolerate intense rehabilitation     Barriers to Discharge        Equipment Recommendations  Rolling walker with 5" wheels    Recommendations for Other Services    Frequency Min 5X/week   Plan Discharge plan remains appropriate;Frequency remains appropriate    Precautions / Restrictions Precautions Precautions: Fall Required Braces or Orthoses: Knee Immobilizer - Right Knee Immobilizer - Right: On at all times Restrictions Weight Bearing Restrictions: Yes RLE Weight Bearing: Non weight bearing   Pertinent Vitals/Pain Indicates pain, but states she has had meds already.      Mobility  Bed Mobility Bed Mobility: Supine to Sit;Sitting - Scoot to Edge of Bed Supine to Sit: 3: Mod assist Sitting - Scoot to Edge of Bed: 3: Mod assist Details for Bed Mobility Assistance: A with R LE and use of pad to bring hips towards EOB.   Transfers Transfers: Sit to Stand;Stand to Sit;Stand Pivot Transfers Sit to Stand: 1: +2 Total assist;With upper extremity assist;From bed Sit to Stand: Patient Percentage: 30% Stand to Sit: 1: +2 Total assist;With upper extremity assist;To chair/3-in-1;With armrests Stand to Sit: Patient Percentage: 30% Stand Pivot Transfers: 1: +2 Total assist Stand Pivot Transfers: Patient Percentage: 20% Details for Transfer Assistance: cues for UE use, pt's R foot on PT's foot, step by step cueing through transfer.   Ambulation/Gait Ambulation/Gait Assistance: Not tested (comment) Stairs: No Wheelchair  Mobility Wheelchair Mobility: No    Exercises     PT Diagnosis:    PT Problem List:   PT Treatment Interventions:     PT Goals Acute Rehab PT Goals PT Goal Formulation: With patient Time For Goal Achievement: 05/07/12 Potential to Achieve Goals: Good PT Goal: Supine/Side to Sit - Progress: Progressing toward goal Pt will go Sit to Supine/Side: with supervision PT Goal: Sit to Supine/Side - Progress: Goal set today Pt will go Sit to Stand: with min assist PT Goal: Sit to Stand - Progress: Goal set today Pt will go Stand to Sit: with min assist PT Goal: Stand to Sit - Progress: Goal set today PT Transfer Goal: Bed to Chair/Chair to Bed - Progress: Progressing toward goal  Visit Information  Last PT Received On: 05/01/12 Assistance Needed: +2 PT/OT Co-Evaluation/Treatment: Yes    Subjective Data  Subjective: I might squeal.     Cognition  Cognition Arousal/Alertness: Awake/alert Behavior During Therapy: WFL for tasks assessed/performed Overall Cognitive Status: Within Functional Limits for tasks assessed    Balance  Balance Balance Assessed: No  End of Session PT - End of Session Equipment Utilized During Treatment: Gait belt;Right knee immobilizer Activity Tolerance: Patient limited by pain Patient left: in chair;with call bell/phone within reach Nurse Communication: Mobility status   GP     RitenourAlison Murray, [place] [phone] 05/01/2012, 12:41 PM

## 2012-05-01 NOTE — Progress Notes (Signed)
[facility] PROGRESS NOTE  KELYSE PASK YNW:[id number] DOB: Jun 04, 1924 DOA: 04/28/2012 PCP: Ezequiel Kayser, MD  Assessment/Plan: 1. Right femoral shaft fracture status post mechanical fall - per ortho service. S/P ORIF 4.15. anticoagulation and activity to be determine by orthopedic service. Continue PRN pain meds and bowel regimen. PT/Ot recommend skilled nursing placement 2. ABLA:  transfused 2 units of PRBC's 4.16 and follow Hgb trend 3. History of sick sinus syndrome status post pacemaker placement - no acute ischemic changes; paced rhythm andno CP. 4. Hypothyroidism - continue Synthroid 50 mcg daily 5. Hyperlipidemia - continue Zocor 5 daily  DVT: coumadin.  Code Status: Full Family Communication: no family members at bedside Disposition Plan: will need SNF at discharge   Consultants:  orthopedica service  Procedures:  Right ORIF distal femur fracture  Antibiotics:  Ancef prophylatically  HPI/Subjective: Afebrile, complaining of pain on her hip-states it is maybe 3/10 right now laying in bed.  When moving states it is about 9/10   Objective: Filed Vitals:   04/30/12 1730 04/30/12 2140 05/01/12 0542 05/01/12 1406  BP: 85/48 111/53 121/48 92/41  Pulse: 61 64 60 60  Temp: 100.1 F (37.8 C) 100.3 F (37.9 C) 100.3 F (37.9 C) 98.4 F (36.9 C)  TempSrc:    Oral  Resp: 18 18 18 18   Height:      Weight:      SpO2:  94% 95% 98%    Intake/Output Summary (Last 24 hours) at 05/01/12 1623 Last data filed at 05/01/12 1500  Gross per 24 hour  Intake    440 ml  Output    600 ml  Net   -160 ml   Filed Weights   04/29/12 0420  Weight: 66 kg (145 lb 8.1 oz)    Exam:   General:  NAD, complaining of pain on her hip  Cardiovascular: S1 and S2, no rubs or gallops  Respiratory: CTA bilaterally   Data Reviewed: Basic Metabolic Panel:  Recent Labs Lab 04/28/12 2025 04/29/12 0450 04/29/12 2059 04/30/12 0538 05/01/12 0437  NA 136 135  --  134* 132*   K 4.2 4.3  --  4.0 3.8  CL 101 103  --  103 100  CO2 28 26  --  25 25  GLUCOSE 112* 132*  --  100* 103*  BUN 17 14  --  11 12  CREATININE 0.91 0.75 0.65 0.72 0.70  CALCIUM 8.8 8.6  --  7.9* 8.3*   Liver Function Tests:  Recent Labs Lab 04/29/12 0450  ALBUMIN 3.0*   CBC:  Recent Labs Lab 04/28/12 2025 04/29/12 0450 04/29/12 2059 04/30/12 0538 05/01/12 0437  WBC 7.4 9.4 10.8* 7.3 9.9  NEUTROABS 5.3  --   --   --   --   HGB 10.5* 9.6* 9.9* 8.6* 11.1*  HCT 31.9* 28.9* 28.0* 24.9* 30.7*  MCV 88.4 88.4 83.1 83.0 82.5  PLT 141* 137* 82* 89* 79*    Recent Results (from the past 240 hour(s))  URINE CULTURE     Status: None   Collection Time    04/29/12 10:24 AM      Result Value Range Status   Specimen Description URINE, CATHETERIZED   Final   Special Requests NONE   Final   Culture  Setup Time 04/29/2012 15:07   Final   Colony Count NO GROWTH   Final   Culture NO GROWTH   Final   Report Status 04/30/2012 FINAL   Final  SURGICAL PCR SCREEN  Status: None   Collection Time    04/29/12 10:30 AM      Result Value Range Status   MRSA, PCR NEGATIVE  NEGATIVE Final   Staphylococcus aureus NEGATIVE  NEGATIVE Final   Comment:            The Xpert SA Assay (FDA     approved for NASAL specimens     in patients over 77 years of age),     is one component of     a comprehensive surveillance     program.  Test performance has     been validated by [employer] for patients greater     than or equal to 6 year old.     It is not intended     to diagnose infection nor to     guide or monitor treatment.     Studies: Dg Femur Right  04/29/2012  *RADIOLOGY REPORT*  Clinical Data: Right femur fracture.  DG C-ARM 61-120 MIN,RIGHT FEMUR - 2 VIEW  Comparison: Radiographs dated 04/29/2047  Findings: The patient has undergone open reduction and internal fixation of the fracture of the distal femoral shaft.  Side plate and multiple fixation screws are in place.  There is near  anatomic alignment and position of the fracture fragments.  IMPRESSION: Open reduction and internal fixation of distal right femur fracture.   Original Report Authenticated By: Francene Boyers, M.D.    Dg Femur Right Port  04/29/2012  *RADIOLOGY REPORT*  Clinical Data: Right femur fracture.  PORTABLE RIGHT FEMUR - 2 VIEW  Comparison: Radiographs dated 04/15 and 04/28/2012  Findings: The patient has undergone open reduction and internal fixation of the spiral fracture of the distal right femur.  Side plate and fixation screws in place.  Near anatomic alignment and position at the fracture.  IMPRESSION: Open reduction and internal fixation of distal femoral shaft fracture.   Original Report Authenticated By: Francene Boyers, M.D.    Dg C-arm [phone] Min  04/29/2012  *RADIOLOGY REPORT*  Clinical Data: Right femur fracture.  DG C-ARM 61-120 MIN,RIGHT FEMUR - 2 VIEW  Comparison: Radiographs dated 04/29/2047  Findings: The patient has undergone open reduction and internal fixation of the fracture of the distal femoral shaft.  Side plate and multiple fixation screws are in place.  There is near anatomic alignment and position of the fracture fragments.  IMPRESSION: Open reduction and internal fixation of distal right femur fracture.   Original Report Authenticated By: Francene Boyers, M.D.     Scheduled Meds: . docusate sodium  100 mg Oral BID  . enoxaparin (LOVENOX) injection  40 mg Subcutaneous Q24H  . [START ON 05/02/2012] levothyroxine  50 mcg Oral QAC breakfast  . omega-3 acid ethyl esters  1 g Oral Daily  . senna  1 tablet Oral BID  . simvastatin  5 mg Oral q1800  . warfarin  5 mg Oral ONCE-1800  . warfarin   Does not apply Once  . Warfarin - Pharmacist Dosing Inpatient   Does not apply q1800   Continuous Infusions: . 0.45 % NaCl with KCl 20 mEq / L 20 mL/hr at 04/30/12 2211    Principal Problem:   Fracture, femur, shaft Active Problems:   SICK SINUS SYNDROME   Unspecified hypothyroidism   Other  and unspecified hyperlipidemia   Time spent: >30 minutes    Rhetta Mura  [facility] Pager [phone]. If 7PM-7AM, please contact night-coverage at [email or web address], password [facility] 05/01/2012, 4:23 PM  LOS: 3 days

## 2012-05-02 LAB — VITAMIN D 1,25 DIHYDROXY
Vitamin D 1, 25 (OH)2 Total: 30 pg/mL (ref 18–72)
Vitamin D3 1, 25 (OH)2: 30 pg/mL

## 2012-05-02 LAB — CBC
HCT: 27.8 % — ABNORMAL LOW (ref 36.0–46.0)
Hemoglobin: 9.8 g/dL — ABNORMAL LOW (ref 12.0–15.0)
MCH: 29.5 pg (ref 26.0–34.0)
MCHC: 35.3 g/dL (ref 30.0–36.0)
MCV: 83.7 fL (ref 78.0–100.0)

## 2012-05-02 MED ORDER — HYDROCODONE-ACETAMINOPHEN 5-325 MG PO TABS: 1.0000 | ORAL_TABLET | Freq: Four times a day (QID) | ORAL | Status: DC | PRN

## 2012-05-02 MED ORDER — SORBITOL 70 % PO SOLN: 15.0000 mL | Freq: Every day | ORAL | Status: DC | PRN

## 2012-05-02 MED ORDER — DSS 100 MG PO CAPS: 100.0000 mg | ORAL_CAPSULE | Freq: Two times a day (BID) | ORAL | Status: DC

## 2012-05-02 NOTE — Progress Notes (Signed)
ANTICOAGULATION CONSULT NOTE - Follow Up Consult  Pharmacy Consult:  Coumadin Indication:  VTE prophylaxis  Allergies  Allergen Reactions  . Prednisone     Patient Measurements: Height: 5\' 5"  (165.1 cm) Weight: 145 lb 8.1 oz (66 kg) IBW/kg (Calculated) : 57  Vital Signs: Temp: 98.2 F (36.8 C) (04/18 0541) Temp src: Oral (04/18 0541) BP: 115/53 mmHg (04/18 0541) Pulse Rate: 60 (04/18 0541)  Labs:  Recent Labs  04/29/12 2059 04/30/12 0538 05/01/12 0437 05/02/12 0535  HGB 9.9* 8.6* 11.1* 9.8*  HCT 28.0* 24.9* 30.7* 27.8*  PLT 82* 89* 79* 84*  LABPROT  --  14.7 17.4* 19.4*  INR  --  1.17 1.47 1.70*  CREATININE 0.65 0.72 0.70  --     Estimated Creatinine Clearance: 46.3 ml/min (by C-G formula based on Cr of 0.7).       Assessment: 79 yo female s/p ORIF of femur fracture.  Pharmacy asked to manage Coumadin for VTE prophylaxis post-op.  INR sub-therapeutic but trending up nicely.  No bleeding reported.  Noted plan for discharge.   Goal of Therapy:  INR 2-3 Monitor platelets by anticoagulation protocol: Yes    Plan:  - Coumadin 5mg  PO today (did not enter dose since d/c completed).  Noted d/c on Coumadin 5mg  PO daily - agree.  Recommend blood work on Monday 05/05/12. - Continue Lovenox until INR >/= 1.8 per MD order - Daily PT / INR      Deem Marmol D. Laney Potash, PharmD, BCPS Pager:  [phone] 05/02/2012, 10:53 AM

## 2012-05-02 NOTE — Progress Notes (Signed)
Clinical social worker assisted with patient discharge to skilled nursing facility, [facility].  CSW addressed all family questions and concerns. CSW copied chart and added all important documents. CSW also set up patient transportation with [facility]. Clinical Social Worker will sign off for now as social work intervention is no longer needed.   Trinetta Alemu, MSW, [phone] 

## 2012-05-02 NOTE — Discharge Planning (Signed)
Report called to [place] at [facility]. 

## 2012-05-02 NOTE — Progress Notes (Signed)
Subjective: 3 Days Post-Op Procedure(s) (LRB): OPEN REDUCTION INTERNAL FIXATION (ORIF) DISTAL FEMUR FRACTURE (Right) Patient reports pain as mild and moderate with movement.  Objective: Vital signs in last 24 hours: Temp:  [98.2 F (36.8 C)-98.4 F (36.9 C)] 98.2 F (36.8 C) (04/18 0541) Pulse Rate:  [60] 60 (04/18 0541) Resp:  [16-18] 16 (04/18 0541) BP: (92-120)/(41-55) 115/53 mmHg (04/18 0541) SpO2:  [97 %-98 %] 97 % (04/18 0541)  Intake/Output from previous day: 04/17 0701 - 04/18 0700 In: 560 [P.O.:560] Out: 300 [Urine:300] Intake/Output this shift:     Recent Labs  04/29/12 2059 04/30/12 0538 05/01/12 0437 05/02/12 0535  HGB 9.9* 8.6* 11.1* 9.8*    Recent Labs  05/01/12 0437 05/02/12 0535  WBC 9.9 8.0  RBC 3.72* 3.32*  HCT 30.7* 27.8*  PLT 79* 84*    Recent Labs  04/30/12 0538 05/01/12 0437  NA 134* 132*  K 4.0 3.8  CL 103 100  CO2 25 25  BUN 11 12  CREATININE 0.72 0.70  GLUCOSE 100* 103*  CALCIUM 7.9* 8.3*    Recent Labs  05/01/12 0437 05/02/12 0535  INR 1.47 1.70*    Neurovascular intact Sensation intact distally Intact pulses distally Dorsiflexion/Plantar flexion intact Incision: dressing C/D/I and scant drainage No cellulitis present Compartment soft  Assessment/Plan: 3 Days Post-Op Procedure(s) (LRB): OPEN REDUCTION INTERNAL FIXATION (ORIF) DISTAL FEMUR FRACTURE (Right) Up with therapy Discharge to SNF NWB RLE in knee immobilizer   Nilsa Macht, Ivin Booty 05/02/2012, 10:37 AM

## 2012-05-02 NOTE — Discharge Summary (Signed)
Physician Discharge Summary  Robin Atkins RUE:[id number] DOB: Oct 24, 1924 DOA: 04/28/2012  PCP: Ezequiel Kayser, MD  Admit date: 04/28/2012 Discharge date: 05/02/2012  Time spent: 45 minutes  Recommendations for Outpatient Follow-up:  1. Get CBC and Bmet and INR in about 1 week 2. May need out-patient follow-up for Thrombocytopenia 3. Needs about 6 weeks of coumadin post-op from surgery.  No clear indication to continue subsequent to that at present 4. Patient to be NWB on R side for about 8 weeks until seen again by Dr. Dion Saucier  Discharge Diagnoses:  Principal Problem:   Fracture, femur, shaft Active Problems:   SICK SINUS SYNDROME   Unspecified hypothyroidism   Other and unspecified hyperlipidemia   Discharge Condition: Good  Diet recommendation:  Reg  Filed Weights   04/29/12 0420  Weight: 66 kg (145 lb 8.1 oz)    History of present illness:  79 YR old CF admitted 4.15.14 with R femoral shaft # subsequent to an accidental trip and fall without any prodrome  Hospital Course:  1. Right femoral shaft fracture status post mechanical fall - per ortho service. S/P ORIF 4.15. Anticoagulation with coumadin [INR 1.7 on discharge].  NWB status R leg for 8 weeks.  Dr. Dion Saucier will address further recommendations as out-patient Continue PRN pain meds and bowel regimen. PT/Ot recommend skilled nursing placement 2. ABLA: transfused 2 units of PRBC's 4.16 and follow Hgb trend-this stabilized in the 9 range 3. History of sick sinus syndrome status post pacemaker placement - no acute ischemic changes; paced rhythm and no CP. 4. Hypothyroidism - continue Synthroid 50 mcg daily 5. Hyperlipidemia - continue Zocor 5 daily   Procedures:  ORIF R Femur 4.15  Consultations:  Orthopedics Dr, Dion Saucier  Discharge Exam: Filed Vitals:   05/01/12 2100 05/02/12 0000 05/02/12 0400 05/02/12 0541  BP: 120/55   115/53  Pulse: 60   60  Temp: 98.4 F (36.9 C)   98.2 F (36.8 C)  TempSrc: Oral    Oral  Resp: 17 17 17 16   Height:      Weight:      SpO2: 97% 97% 97% 97%   Alert pleasant oriented.  NO distress  General:  well Cardiovascular: s1 s2 no m/r/g Respiratory: clear  Discharge Instructions  Discharge Orders   Future Appointments Provider Department Dept Phone   06/02/2012 11:10 AM [facility] Main Office [place]) [phone]   Future Orders Complete By Expires     Diet - low sodium heart healthy  As directed     Increase activity slowly  As directed     Non weight bearing  As directed         Medication List    TAKE these medications       aspirin 81 MG tablet  Take 81 mg by mouth daily.     calcium carbonate 600 MG Tabs  Commonly known as:  OS-CAL  Take 600 mg by mouth daily.     cholecalciferol 1000 UNITS tablet  Commonly known as:  VITAMIN D  Take 1,000 Units by mouth daily.     DSS 100 MG Caps  Take 100 mg by mouth 2 (two) times daily.     fish oil-omega-3 fatty acids 1000 MG capsule  Take 2 g by mouth daily.     HYDROcodone-acetaminophen 5-325 MG per tablet  Commonly known as:  NORCO/VICODIN  Take 1-2 tablets by mouth every 6 (six) hours as needed.     levothyroxine 50 MCG  tablet  Commonly known as:  SYNTHROID, LEVOTHROID  Take 50 mcg by mouth daily.     multivitamin tablet  Take 1 tablet by mouth daily.     pravastatin 10 MG tablet  Commonly known as:  PRAVACHOL  Take 20 mg by mouth daily.     sennosides-docusate sodium 8.6-50 MG tablet  Commonly known as:  SENOKOT-S  Take 1 tablet by mouth daily.     sorbitol 70 % solution  Take 15 mLs by mouth daily as needed.     warfarin 5 MG tablet  Commonly known as:  COUMADIN  Take 1 tablet (5 mg total) by mouth daily.     zoledronic acid 5 MG/100ML Soln  Commonly known as:  RECLAST  Inject 5 mg into the vein once. Infuse 5mg  iv once a year in April.  Last 4/12, third and fourth dose 5/13           Follow-up Information   Follow up with  Eulas Post, MD. Schedule an appointment as soon as possible for a visit in 2 weeks.   Contact information:   [address] [place] [address] [phone]       Follow up with Rodrigo Ran A, MD In 1 month.   Contact information:   [address] [place] [address] [phone]        The results of significant diagnostics from this hospitalization (including imaging, microbiology, ancillary and laboratory) are listed below for reference.    Significant Diagnostic Studies: Dg Chest 1 View  04/28/2012  *RADIOLOGY REPORT*  Clinical Data: Larey Seat.  Right leg pain.  CHEST - 1 VIEW  Comparison: 06/17/2007.  Findings: The cardiac silhouette, mediastinal and hilar contours are normal.  The lungs are clear.  The pacer wires are stable.  The bony thorax is grossly normal.  IMPRESSION: No acute cardiopulmonary findings.   Original Report Authenticated By: Rudie Meyer, M.D.    Dg Femur Right  04/29/2012  *RADIOLOGY REPORT*  Clinical Data: Right femur fracture.  DG C-ARM 61-120 MIN,RIGHT FEMUR - 2 VIEW  Comparison: Radiographs dated 04/29/2047  Findings: The patient has undergone open reduction and internal fixation of the fracture of the distal femoral shaft.  Side plate and multiple fixation screws are in place.  There is near anatomic alignment and position of the fracture fragments.  IMPRESSION: Open reduction and internal fixation of distal right femur fracture.   Original Report Authenticated By: Francene Boyers, M.D.    Dg Femur Right  04/28/2012  *RADIOLOGY REPORT*  Clinical Data: Mid right femur pain secondary to a fall.  RIGHT FEMUR - 2 VIEW  Comparison: None.  Findings: There is a slightly comminuted slightly displaced slightly angulated spiral fracture of the distal right femoral shaft.  Right total hip prosthesis is properly located.  No other acute abnormalities.  IMPRESSION: Spiral fracture of the distal right femoral shaft.   Original Report Authenticated By: Francene Boyers, M.D.    Dg Femur Right Port  04/29/2012  *RADIOLOGY REPORT*  Clinical Data: Right femur fracture.  PORTABLE RIGHT FEMUR - 2 VIEW  Comparison: Radiographs dated 04/15 and 04/28/2012  Findings: The patient has undergone open reduction and internal fixation of the spiral fracture of the distal right femur.  Side plate and fixation screws in place.  Near anatomic alignment and position at the fracture.  IMPRESSION: Open reduction and internal fixation of distal femoral shaft fracture.   Original Report Authenticated By: Francene Boyers, M.D.    Dg  C-arm 61-120 Min  04/29/2012  *RADIOLOGY REPORT*  Clinical Data: Right femur fracture.  DG C-ARM 61-120 MIN,RIGHT FEMUR - 2 VIEW  Comparison: Radiographs dated 04/29/2047  Findings: The patient has undergone open reduction and internal fixation of the fracture of the distal femoral shaft.  Side plate and multiple fixation screws are in place.  There is near anatomic alignment and position of the fracture fragments.  IMPRESSION: Open reduction and internal fixation of distal right femur fracture.   Original Report Authenticated By: Francene Boyers, M.D.     Microbiology: Recent Results (from the past 240 hour(s))  URINE CULTURE     Status: None   Collection Time    04/29/12 10:24 AM      Result Value Range Status   Specimen Description URINE, CATHETERIZED   Final   Special Requests NONE   Final   Culture  Setup Time 04/29/2012 15:07   Final   Colony Count NO GROWTH   Final   Culture NO GROWTH   Final   Report Status 04/30/2012 FINAL   Final  SURGICAL PCR SCREEN     Status: None   Collection Time    04/29/12 10:30 AM      Result Value Range Status   MRSA, PCR NEGATIVE  NEGATIVE Final   Staphylococcus aureus NEGATIVE  NEGATIVE Final   Comment:            The Xpert SA Assay (FDA     approved for NASAL specimens     in patients over 31 years of age),     is one component of     a comprehensive surveillance     program.  Test performance has      been validated by [employer] for patients greater     than or equal to 29 year old.     It is not intended     to diagnose infection nor to     guide or monitor treatment.     Labs: Basic Metabolic Panel:  Recent Labs Lab 04/28/12 2025 04/29/12 0450 04/29/12 2059 04/30/12 0538 05/01/12 0437  NA 136 135  --  134* 132*  K 4.2 4.3  --  4.0 3.8  CL 101 103  --  103 100  CO2 28 26  --  25 25  GLUCOSE 112* 132*  --  100* 103*  BUN 17 14  --  11 12  CREATININE 0.91 0.75 0.65 0.72 0.70  CALCIUM 8.8 8.6  --  7.9* 8.3*   Liver Function Tests:  Recent Labs Lab 04/29/12 0450  ALBUMIN 3.0*   No results found for this basename: LIPASE, AMYLASE,  in the last 168 hours No results found for this basename: AMMONIA,  in the last 168 hours CBC:  Recent Labs Lab 04/28/12 2025 04/29/12 0450 04/29/12 2059 04/30/12 0538 05/01/12 0437 05/02/12 0535  WBC 7.4 9.4 10.8* 7.3 9.9 8.0  NEUTROABS 5.3  --   --   --   --   --   HGB 10.5* 9.6* 9.9* 8.6* 11.1* 9.8*  HCT 31.9* 28.9* 28.0* 24.9* 30.7* 27.8*  MCV 88.4 88.4 83.1 83.0 82.5 83.7  PLT 141* 137* 82* 89* 79* 84*   Cardiac Enzymes: No results found for this basename: CKTOTAL, CKMB, CKMBINDEX, TROPONINI,  in the last 168 hours BNP: BNP (last 3 results) No results found for this basename: PROBNP,  in the last 8760 hours CBG: No results found for this basename: GLUCAP,  in the last 168 hours     Signed:  Rhetta Mura  [facility] 05/02/2012, 9:31 AM

## 2012-06-02 ENCOUNTER — Encounter: Payer: Medicare Other | Admitting: *Deleted

## 2012-06-06 ENCOUNTER — Encounter: Payer: Self-pay | Admitting: *Deleted

## 2012-06-16 ENCOUNTER — Telehealth: Payer: Self-pay | Admitting: Internal Medicine

## 2012-06-16 NOTE — Telephone Encounter (Signed)
Pt is in Rehab at [facility] for next 4 to 8 weeks. Pt's husband will try sending from home. Instructed pt about changing to 9 if home has to dial 9 to get outside line. Husband will try and resend again tonight and call tomorrow to verify if transmission was received.

## 2012-06-16 NOTE — Telephone Encounter (Signed)
New Prob     Pt is having some issues transmitting and requesting some assistance.

## 2012-06-16 NOTE — Telephone Encounter (Signed)
LMOM to return call/kwm

## 2012-07-17 ENCOUNTER — Telehealth: Payer: Self-pay | Admitting: Internal Medicine

## 2012-07-17 NOTE — Telephone Encounter (Signed)
lmom for patient to return call on Monday

## 2012-07-17 NOTE — Telephone Encounter (Signed)
New Prob     Would like to speak to nurse regarding heart monitor. Please call.

## 2012-07-23 NOTE — Telephone Encounter (Signed)
Spoke w/pt's husband in regards to pacemaker.  Unable to send remote checks due to machine not working.  Husband is concerned with pacemaker not being checked. Scheduled for in office pacemaker check for 07-30-12 @ 1530. Husband to set up transportation thru [facility] [place] and will call if there is a problem.

## 2012-07-23 NOTE — Telephone Encounter (Signed)
New Prob      Pts husband has some questions regarding his wifes device (pacemaker). Please call.

## 2012-07-30 ENCOUNTER — Ambulatory Visit (INDEPENDENT_AMBULATORY_CARE_PROVIDER_SITE_OTHER): Payer: Medicare Other | Admitting: *Deleted

## 2012-07-30 DIAGNOSIS — I495 Sick sinus syndrome: Secondary | ICD-10-CM

## 2012-07-30 LAB — PACEMAKER DEVICE OBSERVATION
AL THRESHOLD: 0.75 V
BAMS-0001: 150 {beats}/min
RV LEAD AMPLITUDE: 5.6 mv
RV LEAD IMPEDENCE PM: 364 Ohm
RV LEAD THRESHOLD: 0.75 V

## 2012-07-30 NOTE — Progress Notes (Signed)
PPM check in office. 

## 2012-08-22 ENCOUNTER — Encounter: Payer: Self-pay | Admitting: Internal Medicine

## 2012-09-03 ENCOUNTER — Ambulatory Visit (INDEPENDENT_AMBULATORY_CARE_PROVIDER_SITE_OTHER): Payer: Medicare Other | Admitting: *Deleted

## 2012-09-03 DIAGNOSIS — I495 Sick sinus syndrome: Secondary | ICD-10-CM

## 2012-09-10 ENCOUNTER — Encounter: Payer: Self-pay | Admitting: Cardiology

## 2012-10-08 ENCOUNTER — Ambulatory Visit (INDEPENDENT_AMBULATORY_CARE_PROVIDER_SITE_OTHER): Payer: Medicare Other | Admitting: *Deleted

## 2012-10-08 DIAGNOSIS — I495 Sick sinus syndrome: Secondary | ICD-10-CM

## 2012-10-08 LAB — PACEMAKER DEVICE OBSERVATION: BAMS-0001: 150 {beats}/min

## 2012-10-18 ENCOUNTER — Encounter: Payer: Self-pay | Admitting: Internal Medicine

## 2012-10-21 ENCOUNTER — Encounter: Payer: Self-pay | Admitting: Internal Medicine

## 2012-11-06 ENCOUNTER — Encounter: Payer: Self-pay | Admitting: Internal Medicine

## 2012-11-06 ENCOUNTER — Ambulatory Visit (INDEPENDENT_AMBULATORY_CARE_PROVIDER_SITE_OTHER): Payer: Medicare Other | Admitting: *Deleted

## 2012-11-06 DIAGNOSIS — I495 Sick sinus syndrome: Secondary | ICD-10-CM

## 2012-11-06 LAB — PACEMAKER DEVICE OBSERVATION: BAMS-0001: 150 {beats}/min

## 2012-11-06 NOTE — Progress Notes (Signed)
Battery check only.  Estimated longevity <1-16 months with an average of 7 months.  ROV in November for recheck.

## 2012-12-04 ENCOUNTER — Ambulatory Visit (INDEPENDENT_AMBULATORY_CARE_PROVIDER_SITE_OTHER): Payer: Medicare Other | Admitting: *Deleted

## 2012-12-04 ENCOUNTER — Encounter: Payer: Self-pay | Admitting: Internal Medicine

## 2012-12-04 DIAGNOSIS — I495 Sick sinus syndrome: Secondary | ICD-10-CM

## 2012-12-04 LAB — MDC_IDC_ENUM_SESS_TYPE_INCLINIC
Battery Impedance: 4969 Ohm
Battery Voltage: 2.64 V
Brady Statistic AP VS Percent: 100 %
Lead Channel Impedance Value: 366 Ohm
Lead Channel Setting Pacing Amplitude: 2 V
Lead Channel Setting Sensing Sensitivity: 2 mV

## 2012-12-04 NOTE — Progress Notes (Signed)
Battery check only.  Estimated longevity <1-13 months with an average of 4  months.  ROV in December for recheck.

## 2013-01-01 ENCOUNTER — Ambulatory Visit (INDEPENDENT_AMBULATORY_CARE_PROVIDER_SITE_OTHER): Payer: Medicare Other | Admitting: *Deleted

## 2013-01-01 ENCOUNTER — Encounter: Payer: Self-pay | Admitting: Internal Medicine

## 2013-01-01 DIAGNOSIS — Z45010 Encounter for checking and testing of cardiac pacemaker pulse generator [battery]: Secondary | ICD-10-CM

## 2013-01-01 DIAGNOSIS — Z45018 Encounter for adjustment and management of other part of cardiac pacemaker: Secondary | ICD-10-CM

## 2013-01-01 LAB — MDC_IDC_ENUM_SESS_TYPE_INCLINIC
Battery Impedance: 5754 Ohm
Battery Voltage: 2.61 V
Brady Statistic AP VP Percent: 0 %
Brady Statistic AP VS Percent: 100 %
Brady Statistic AS VP Percent: 0 %
Lead Channel Impedance Value: 897 Ohm
Lead Channel Setting Pacing Amplitude: 2 V
Lead Channel Setting Sensing Sensitivity: 2 mV

## 2013-01-01 NOTE — Progress Notes (Signed)
Battery check only, battery expected to reach ERI <72month w/ range of <1 - 99mo.   ROV w/ device clinic for next battery check 02/05/13.

## 2013-02-05 ENCOUNTER — Ambulatory Visit (INDEPENDENT_AMBULATORY_CARE_PROVIDER_SITE_OTHER): Payer: Medicare Other | Admitting: Internal Medicine

## 2013-02-05 ENCOUNTER — Encounter: Payer: Self-pay | Admitting: Internal Medicine

## 2013-02-05 ENCOUNTER — Encounter (HOSPITAL_COMMUNITY): Payer: Self-pay | Admitting: Pharmacy Technician

## 2013-02-05 ENCOUNTER — Encounter: Payer: Medicare Other | Admitting: *Deleted

## 2013-02-05 ENCOUNTER — Encounter: Payer: Self-pay | Admitting: *Deleted

## 2013-02-05 VITALS — BP 110/74 | HR 67 | Ht 65.0 in | Wt 132.0 lb

## 2013-02-05 DIAGNOSIS — I495 Sick sinus syndrome: Secondary | ICD-10-CM

## 2013-02-05 DIAGNOSIS — E785 Hyperlipidemia, unspecified: Secondary | ICD-10-CM | POA: Insufficient documentation

## 2013-02-05 LAB — MDC_IDC_ENUM_SESS_TYPE_INCLINIC
Battery Voltage: 2.6 V
Lead Channel Impedance Value: 355 Ohm

## 2013-02-05 NOTE — Patient Instructions (Signed)
Generator change on Monday 03/12/13    See instruction sheet

## 2013-02-05 NOTE — Progress Notes (Signed)
PCP:  Ezequiel Kayser, MD  The patient presents today for add-on electrophysiology followup.  Since last being seen in our clinic, the patient reports doing very well.  She remains very active.  She has reverted to VVI pacing for ERI battery status.  With this, she has developed significant SOB and decreased exercise tolerance.  Today, she denies symptoms of palpitations, chest pain, orthopnea, PND, lower extremity edema, dizziness, presyncope, syncope, or neurologic sequela.  The patient feels that she is tolerating medications without difficulties and is otherwise without complaint today.   Past Medical History  Diagnosis Date  . MVP (mitral valve prolapse)   . Hyperlipidemia   . OP (osteoporosis)   . SSS (sick sinus syndrome)     s/p PTVP  . Hypothyroidism   . Broken foot     Broken right foot and broken left knee in 2011  . S/P cardiac pacemaker procedure     Original implant 1998 with generator change in 2005. She has had prior lead revision.   . Knee cap dislocation 2011    left  . Hx of migraine headaches   . Benign recurrent vertigo   . Diverticulosis   . Shingles   . GERD (gastroesophageal reflux disease)   . Wrist fracture     lt  . Cataracts, bilateral   . Hemorrhoids    Past Surgical History  Procedure Laterality Date  . Total hip arthroplasty      right  . Pacemaker insertion  1998/2005    TRANSVENOUS PACEMAKER  . US echocardiography  11/25/2008    EF 55-60%; mild to moderate AI  . Cardiovascular stress test  07/10/2005    EF 78%, NO ISCHEMIA  . Hemorroidectomy    . Orif femur fracture Right 04/29/2012    Procedure: OPEN REDUCTION INTERNAL FIXATION (ORIF) DISTAL FEMUR FRACTURE;  Surgeon: Eulas Post, MD;  Location: [facility] OR;  Service: Orthopedics;  Laterality: Right;  biomet long condylar plate, flat jackson, cerclage wires, big c arm    Current Outpatient Prescriptions  Medication Sig Dispense Refill  . cholecalciferol (VITAMIN D) 1000 UNITS tablet Take 1,000  Units by mouth daily.        . fish oil-omega-3 fatty acids 1000 MG capsule Take 1 g by mouth daily.       Marland Kitchen levothyroxine (SYNTHROID, LEVOTHROID) 75 MCG tablet Take 75 mcg by mouth daily before breakfast.      . pravastatin (PRAVACHOL) 20 MG tablet Take 20 mg by mouth at bedtime.       Marland Kitchen aspirin EC 81 MG tablet Take 81 mg by mouth daily.      . Calcium Carb-Cholecalciferol (CALCIUM 600 + D PO) Take 1,200 mg by mouth daily.      . Multiple Vitamin (MULTIVITAMIN WITH MINERALS) TABS tablet Take 1 tablet by mouth daily.      . Teriparatide, Recombinant, (FORTEO) 600 MCG/2.4ML SOLN Inject 20 mcg into the skin daily after breakfast.       No current facility-administered medications for this visit.    No Known Allergies  History   Social History  . Marital Status: Married    Spouse Name: N/A    Number of Children: 2  . Years of Education: N/A   Occupational History  . retired    Social History Main Topics  . Smoking status: Never Smoker   . Smokeless tobacco: Never Used  . Alcohol Use: No  . Drug Use: No  . Sexual Activity: Not on file  Other Topics Concern  . Not on file   Social History Narrative  . No narrative on file    Family History  Problem Relation Age of Onset  . Heart attack Mother   . Cancer Father     brain tumour  . Lung cancer Brother     x2  . Diabetes Sister     x 2 brothers  . Lymphoma Sister     Physical Exam: Filed Vitals:   02/05/13 1529  BP: 110/74  Pulse: 67  Height: 5\' 5"  (1.651 m)  Weight: 132 lb (59.875 kg)    GEN- The patient is well appearing, alert and oriented x 3 today.   Head- normocephalic, atraumatic Eyes-  Sclera clear, conjunctiva pink Ears- hearing intact Oropharynx- clear Neck- supple, no JVP Lymph- no cervical lymphadenopathy Lungs- Clear to ausculation bilaterally, normal work of breathing Chest- pacemaker pocket is well healed Heart- Regular rate and rhythm, no murmurs, rubs or gallops, PMI not laterally  displaced GI- soft, NT, ND, + BS Extremities- no clubbing, cyanosis, or edema  Pacemaker interrogation- reviewed in detail today,  See PACEART report  Assessment and Plan:  1. Sick sinus syndrome ERI battery status has been reached RIsks, benefits, and alternatives to PPM pulse generator replacement have been discussed at length with the patient and her spouse who wish to proceed.  We will schedule generator change at the next available time.  2. HL Stable No change required today

## 2013-02-06 LAB — BASIC METABOLIC PANEL
BUN: 27 mg/dL — ABNORMAL HIGH (ref 6–23)
CHLORIDE: 110 meq/L (ref 96–112)
CO2: 24 meq/L (ref 19–32)
Calcium: 9.5 mg/dL (ref 8.4–10.5)
Creatinine, Ser: 1.4 mg/dL — ABNORMAL HIGH (ref 0.4–1.2)
GFR: 37.27 mL/min — AB (ref >60.00)
GLUCOSE: 74 mg/dL (ref 70–99)
POTASSIUM: 4.4 meq/L (ref 3.5–5.1)
SODIUM: 141 meq/L (ref 135–145)

## 2013-02-06 LAB — CBC WITH DIFFERENTIAL/PLATELET
BASOS ABS: 0 10*3/uL (ref 0.0–0.1)
Basophils Relative: 0.4 % (ref 0.0–3.0)
Eosinophils Absolute: 0.2 10*3/uL (ref 0.0–0.7)
Eosinophils Relative: 2.7 % (ref 0.0–5.0)
HEMATOCRIT: 33.7 % — AB (ref 36.0–46.0)
HEMOGLOBIN: 11.2 g/dL — AB (ref 12.0–15.0)
LYMPHS ABS: 2 10*3/uL (ref 0.7–4.0)
Lymphocytes Relative: 25.9 % (ref 12.0–46.0)
MCHC: 33.2 g/dL (ref 30.0–36.0)
MCV: 90.4 fl (ref 78.0–100.0)
MONO ABS: 0.5 10*3/uL (ref 0.1–1.0)
MONOS PCT: 6.2 % (ref 3.0–12.0)
NEUTROS ABS: 5 10*3/uL (ref 1.4–7.7)
Neutrophils Relative %: 64.8 % (ref 43.0–77.0)
Platelets: 177 10*3/uL (ref 150.0–400.0)
RBC: 3.72 Mil/uL — ABNORMAL LOW (ref 3.87–5.11)
RDW: 15.6 % — AB (ref 11.5–14.6)
WBC: 7.8 10*3/uL (ref 4.5–10.5)

## 2013-02-08 MED ORDER — CEFAZOLIN SODIUM-DEXTROSE 2-3 GM-% IV SOLR
2.0000 g | INTRAVENOUS | Status: AC
  Filled 2013-02-08 (×2): qty 50

## 2013-02-08 MED ORDER — CHLORHEXIDINE GLUCONATE 4 % EX LIQD
60.0000 mL | Freq: Once | CUTANEOUS | Status: DC
  Filled 2013-02-08: qty 60

## 2013-02-08 MED ORDER — SODIUM CHLORIDE 0.9 % IR SOLN
80.0000 mg | Status: AC
  Filled 2013-02-08: qty 2

## 2013-02-08 MED ORDER — SODIUM CHLORIDE 0.9 % IV SOLN
INTRAVENOUS | Status: DC
Start: 2013-02-08 — End: 2013-02-09
  Administered 2013-02-09: 13:00:00 via INTRAVENOUS

## 2013-02-09 ENCOUNTER — Encounter (HOSPITAL_COMMUNITY)
Admission: RE | Disposition: A | Discharge status: Home / Self Care | Payer: Self-pay | Source: Ambulatory Visit | Attending: Internal Medicine

## 2013-02-09 ENCOUNTER — Ambulatory Visit (HOSPITAL_COMMUNITY)
Admission: RE | Admit: 2013-02-09 | Discharge: 2013-02-09 | Disposition: A | Discharge status: Home / Self Care | Payer: Medicare Other | Source: Ambulatory Visit | Attending: Internal Medicine | Admitting: Internal Medicine

## 2013-02-09 DIAGNOSIS — I495 Sick sinus syndrome: Secondary | ICD-10-CM

## 2013-02-09 DIAGNOSIS — Z45018 Encounter for adjustment and management of other part of cardiac pacemaker: Secondary | ICD-10-CM | POA: Insufficient documentation

## 2013-02-09 DIAGNOSIS — E785 Hyperlipidemia, unspecified: Secondary | ICD-10-CM | POA: Insufficient documentation

## 2013-02-09 DIAGNOSIS — Z96649 Presence of unspecified artificial hip joint: Secondary | ICD-10-CM | POA: Insufficient documentation

## 2013-02-09 DIAGNOSIS — K219 Gastro-esophageal reflux disease without esophagitis: Secondary | ICD-10-CM | POA: Insufficient documentation

## 2013-02-09 HISTORY — PX: PACEMAKER GENERATOR CHANGE: SHX5481

## 2013-02-09 HISTORY — PX: PACEMAKER GENERATOR CHANGE: SHX5998

## 2013-02-09 LAB — SURGICAL PCR SCREEN
MRSA, PCR: NEGATIVE
STAPHYLOCOCCUS AUREUS: NEGATIVE

## 2013-02-09 SURGERY — PACEMAKER GENERATOR CHANGE
Anesthesia: LOCAL

## 2013-02-09 MED ORDER — FENTANYL CITRATE 0.05 MG/ML IJ SOLN
INTRAMUSCULAR | Status: AC
  Filled 2013-02-09: qty 2

## 2013-02-09 MED ORDER — SODIUM CHLORIDE 0.9 % IJ SOLN: 3.0000 mL | INTRAMUSCULAR | Status: DC | PRN

## 2013-02-09 MED ORDER — ONDANSETRON HCL 4 MG/2ML IJ SOLN: 4.0000 mg | Freq: Four times a day (QID) | INTRAMUSCULAR | Status: DC | PRN

## 2013-02-09 MED ORDER — ACETAMINOPHEN 325 MG PO TABS: 325.0000 mg | ORAL_TABLET | ORAL | Status: DC | PRN

## 2013-02-09 MED ORDER — SODIUM CHLORIDE 0.9 % IJ SOLN: 3.0000 mL | Freq: Two times a day (BID) | INTRAMUSCULAR | Status: DC

## 2013-02-09 MED ORDER — HYDROCODONE-ACETAMINOPHEN 5-325 MG PO TABS: 1.0000 | ORAL_TABLET | ORAL | Status: DC | PRN

## 2013-02-09 MED ORDER — MUPIROCIN 2 % EX OINT
TOPICAL_OINTMENT | CUTANEOUS | Status: AC
  Administered 2013-02-09: 1 via NASAL
  Filled 2013-02-09: qty 22

## 2013-02-09 MED ORDER — LIDOCAINE HCL (PF) 1 % IJ SOLN
INTRAMUSCULAR | Status: AC
  Filled 2013-02-09: qty 60

## 2013-02-09 MED ORDER — MIDAZOLAM HCL 5 MG/5ML IJ SOLN
INTRAMUSCULAR | Status: AC
  Filled 2013-02-09: qty 5

## 2013-02-09 MED ORDER — MUPIROCIN 2 % EX OINT
TOPICAL_OINTMENT | Freq: Two times a day (BID) | CUTANEOUS | Status: DC
  Administered 2013-02-09: 1 via NASAL
  Filled 2013-02-09: qty 22

## 2013-02-09 MED ORDER — SODIUM CHLORIDE 0.9 % IV SOLN: 250.0000 mL | INTRAVENOUS | Status: DC | PRN

## 2013-02-09 NOTE — Interval H&P Note (Signed)
History and Physical Interval Note:  02/09/2013 1:09 PM  Robin Atkins  has presented today for surgery, with the diagnosis of ERI  The various methods of treatment have been discussed with the patient and family. After consideration of risks, benefits and other options for treatment, the patient has consented to  Procedure(s): PACEMAKER GENERATOR CHANGE (N/A) as a surgical intervention .  The patient's history has been reviewed, patient examined, no change in status, stable for surgery.  I have reviewed the patient's chart and labs.  Questions were answered to the patient's satisfaction.     Hillis RangeJames Atha Mcbain

## 2013-02-09 NOTE — Discharge Instructions (Signed)
Pacemaker Battery Change, Care After  °Refer to this sheet in the next few weeks. These instructions provide you with information on caring for yourself after your procedure. Your health care provider may also give you more specific instructions. Your treatment has been planned according to current medical practices, but problems sometimes occur. Call your health care provider if you have any problems or questions after your procedure. °WHAT TO EXPECT AFTER THE PROCEDURE °After your procedure, it is typical to have the following sensations: °· Soreness at the pacemaker site. °HOME CARE INSTRUCTIONS  °· Keep the incision clean and dry. °· Unless advised otherwise, you may shower beginning 48 hours after your procedure. °· For the first week after the replacement, avoid stretching motions that pull at the incision site and avoid heavy exercise with the arm on the same side as the incision. °· Only take over-the-counter or prescription medicines for pain, discomfort, or fever as directed by your health care provider. °· Your health care provider will tell you when you will need to next test your pacemaker by telephone or when to return to the office for follow up for removal of stitches. °SEEK MEDICAL CARE IF:  °· You have pain at the incision site that is not relieved by over-the-counter or prescription medicine. °· There is drainage or pus from the incision site. °· There is swelling larger than a lime at the incision site. °· You develop red streaking that extends above or below the incision site. °· You feel brief, intermittent palpitations, lightheadedness, or any symptoms that you feel might be related to your heart. °SEEK IMMEDIATE MEDICAL CARE IF:  °· You experience chest pain that is different than the pain at the pacemaker site. °· Shortness of breath. °· Palpitations or irregular heart beat. °· Lightheadedness that does not go away quickly. °· Fainting. °· You have pain that gets worse and is not relieved by  medicine. °MAKE SURE YOU:  °· Understand these instructions. °· Will watch your condition. °· Will get help right away if you are not doing well or get worse. °Document Released: 10/22/2012 Document Reviewed: 07/16/2012 °[employer]® Patient Information ©2014 [employer]. ° °

## 2013-02-09 NOTE — Progress Notes (Signed)
Discharge instruction given per MD order.  Pt and CG able to verbalize understanding.  Pt to car via wheelchair. 

## 2013-02-09 NOTE — Progress Notes (Signed)
Received pt from cath procedure alert and denies any discomfort.  Pt able tolerate fluids.

## 2013-02-09 NOTE — H&P (View-Only) (Signed)
PCP:  Ezequiel Kayser, MD  The patient presents today for add-on electrophysiology followup.  Since last being seen in our clinic, the patient reports doing very well.  She remains very active.  She has reverted to VVI pacing for ERI battery status.  With this, she has developed significant SOB and decreased exercise tolerance.  Today, she denies symptoms of palpitations, chest pain, orthopnea, PND, lower extremity edema, dizziness, presyncope, syncope, or neurologic sequela.  The patient feels that she is tolerating medications without difficulties and is otherwise without complaint today.   Past Medical History  Diagnosis Date  . MVP (mitral valve prolapse)   . Hyperlipidemia   . OP (osteoporosis)   . SSS (sick sinus syndrome)     s/p PTVP  . Hypothyroidism   . Broken foot     Broken right foot and broken left knee in 2011  . S/P cardiac pacemaker procedure     Original implant 1998 with generator change in 2005. She has had prior lead revision.   . Knee cap dislocation 2011    left  . Hx of migraine headaches   . Benign recurrent vertigo   . Diverticulosis   . Shingles   . GERD (gastroesophageal reflux disease)   . Wrist fracture     lt  . Cataracts, bilateral   . Hemorrhoids    Past Surgical History  Procedure Laterality Date  . Total hip arthroplasty      right  . Pacemaker insertion  1998/2005    TRANSVENOUS PACEMAKER  . US echocardiography  11/25/2008    EF 55-60%; mild to moderate AI  . Cardiovascular stress test  07/10/2005    EF 78%, NO ISCHEMIA  . Hemorroidectomy    . Orif femur fracture Right 04/29/2012    Procedure: OPEN REDUCTION INTERNAL FIXATION (ORIF) DISTAL FEMUR FRACTURE;  Surgeon: Eulas Post, MD;  Location: [facility] OR;  Service: Orthopedics;  Laterality: Right;  biomet long condylar plate, flat jackson, cerclage wires, big c arm    Current Outpatient Prescriptions  Medication Sig Dispense Refill  . cholecalciferol (VITAMIN D) 1000 UNITS tablet Take 1,000  Units by mouth daily.        . fish oil-omega-3 fatty acids 1000 MG capsule Take 1 g by mouth daily.       Marland Kitchen levothyroxine (SYNTHROID, LEVOTHROID) 75 MCG tablet Take 75 mcg by mouth daily before breakfast.      . pravastatin (PRAVACHOL) 20 MG tablet Take 20 mg by mouth at bedtime.       Marland Kitchen aspirin EC 81 MG tablet Take 81 mg by mouth daily.      . Calcium Carb-Cholecalciferol (CALCIUM 600 + D PO) Take 1,200 mg by mouth daily.      . Multiple Vitamin (MULTIVITAMIN WITH MINERALS) TABS tablet Take 1 tablet by mouth daily.      . Teriparatide, Recombinant, (FORTEO) 600 MCG/2.4ML SOLN Inject 20 mcg into the skin daily after breakfast.       No current facility-administered medications for this visit.    No Known Allergies  History   Social History  . Marital Status: Married    Spouse Name: N/A    Number of Children: 2  . Years of Education: N/A   Occupational History  . retired    Social History Main Topics  . Smoking status: Never Smoker   . Smokeless tobacco: Never Used  . Alcohol Use: No  . Drug Use: No  . Sexual Activity: Not on file  Other Topics Concern  . Not on file   Social History Narrative  . No narrative on file    Family History  Problem Relation Age of Onset  . Heart attack Mother   . Cancer Father     brain tumour  . Lung cancer Brother     x2  . Diabetes Sister     x 2 brothers  . Lymphoma Sister     Physical Exam: Filed Vitals:   02/05/13 1529  BP: 110/74  Pulse: 67  Height: 5\' 5"  (1.651 m)  Weight: 132 lb (59.875 kg)    GEN- The patient is well appearing, alert and oriented x 3 today.   Head- normocephalic, atraumatic Eyes-  Sclera clear, conjunctiva pink Ears- hearing intact Oropharynx- clear Neck- supple, no JVP Lymph- no cervical lymphadenopathy Lungs- Clear to ausculation bilaterally, normal work of breathing Chest- pacemaker pocket is well healed Heart- Regular rate and rhythm, no murmurs, rubs or gallops, PMI not laterally  displaced GI- soft, NT, ND, + BS Extremities- no clubbing, cyanosis, or edema  Pacemaker interrogation- reviewed in detail today,  See PACEART report  Assessment and Plan:  1. Sick sinus syndrome ERI battery status has been reached RIsks, benefits, and alternatives to PPM pulse generator replacement have been discussed at length with the patient and her spouse who wish to proceed.  We will schedule generator change at the next available time.  2. HL Stable No change required today

## 2013-02-09 NOTE — Op Note (Signed)
SURGEON:  Robin RangeJames Ayaan Ringle, MD     PREPROCEDURE DIAGNOSES:   1. Sick sinus syndrome.     POSTPROCEDURE DIAGNOSES:   1. Sick sinus syndrome.   PROCEDURES:   1. Pacemaker pulse generator replacement.   2. Skin pocket revision.     INTRODUCTION:  Robin Atkins is a 80 y.o. female with a history of sick sinus syndrome. She has done well since her pacemaker was implanted.  She has recently reached ERI battery status.  She presents today for pacemaker pulse generator replacement.       DESCRIPTION OF THE PROCEDURE:  Informed written consent was obtained, and the patient was brought to the electrophysiology lab in the fasting state.  The patient's pacemaker was interrogated today and found to be at elective replacement indicator battery status.  The patient was adequately sedated with intravenous Versed as outlined in the nursing report.  The patient's right chest was prepped and draped in the usual sterile fashion by the EP lab staff.  The skin overlying the existing pacemaker was infiltrated with lidocaine for local analgesia.  A 4-cm incision was made over the pacemaker pocket.  Using a combination of sharp and blunt dissection, the pacemaker was exposed and removed from the body.  The device was disconnected from the leads. A single silk suture was identified and removed which had secured the device to the pectoralis fascia.  There was no foreign matter or debris within the pocket.  The atrial lead was confirmed to be a Guidant model [id number] (serial number [id number]) lead implanted on 09/17/2003.  The right ventricular lead was confirmed to be a Guidant model 4470 (serial number [id number]) lead implanted on the same date as the atrial lead (above).  Both leads were examined and their integrity was confirmed to be intact.  Atrial lead P-waves measured 3.4 mV with impedance of 533 ohms and a threshold of 0.6 V at 0.5 msec.  Right ventricular lead R-waves measured 10.5 mV with impedance of 345 ohms and a threshold of  0.5 V at 0.5 msec.  Both leads were connected to a Medtronic adapt L model ADDDR 1 (serial number [id number]) pacemaker.  The pocket was revised to accommodate this new device.  Electrocautery was required to assure hemostasis.  The pocket was irrigated with copious gentamicin solution. The pacemaker was then placed into the pocket.  The pocket was then closed in 2 layers with 2-0 Vicryl suture over the subcutaneous and subcuticular layers.  Steri-Strips and a sterile dressing were then applied.  There were no early apparent complications.     CONCLUSIONS:   1. Successful pacemaker pulse generator replacement for sick sinus syndrome and elective replacement indicator battery status   2. No early apparent complications.     Robin RangeJames Cathryn Gallery, MD 02/09/2013 3:10 PM

## 2013-02-10 ENCOUNTER — Encounter: Payer: Self-pay | Admitting: Internal Medicine

## 2013-02-13 ENCOUNTER — Telehealth: Payer: Self-pay | Admitting: Internal Medicine

## 2013-02-13 NOTE — Telephone Encounter (Signed)
Spoke w/pt---pt said rash came up during the night all over chest. Just over chest no where else. No fever or chills or SOB.

## 2013-02-13 NOTE — Telephone Encounter (Signed)
New Prob     Pt has noted a rash to her chest post pacemaker placement. She would like to speak to a nurse regarding her concerns. Please call.

## 2013-02-16 ENCOUNTER — Telehealth: Payer: Self-pay | Admitting: Internal Medicine

## 2013-02-16 ENCOUNTER — Ambulatory Visit (INDEPENDENT_AMBULATORY_CARE_PROVIDER_SITE_OTHER): Payer: Medicare Other | Admitting: *Deleted

## 2013-02-16 DIAGNOSIS — I495 Sick sinus syndrome: Secondary | ICD-10-CM

## 2013-02-16 DIAGNOSIS — E785 Hyperlipidemia, unspecified: Secondary | ICD-10-CM

## 2013-02-16 LAB — MDC_IDC_ENUM_SESS_TYPE_INCLINIC
Battery Impedance: 100 Ohm
Battery Remaining Longevity: 161 mo
Battery Voltage: 2.8 V
Brady Statistic AP VS Percent: 100 %
Brady Statistic AS VP Percent: 0 %
Brady Statistic AS VS Percent: 0 %
Date Time Interrogation Session: 20150202125809
Lead Channel Impedance Value: 363 Ohm
Lead Channel Impedance Value: 637 Ohm
Lead Channel Pacing Threshold Amplitude: 0.75 V
Lead Channel Pacing Threshold Pulse Width: 0.4 ms
Lead Channel Setting Pacing Amplitude: 2.5 V
Lead Channel Setting Pacing Pulse Width: 0.4 ms
MDC IDC MSMT LEADCHNL RV PACING THRESHOLD AMPLITUDE: 0.5 V
MDC IDC MSMT LEADCHNL RV PACING THRESHOLD PULSEWIDTH: 0.4 ms
MDC IDC MSMT LEADCHNL RV SENSING INTR AMPL: 8 mV
MDC IDC SET LEADCHNL RA PACING AMPLITUDE: 2 V
MDC IDC SET LEADCHNL RV SENSING SENSITIVITY: 2.8 mV
MDC IDC STAT BRADY AP VP PERCENT: 0 %

## 2013-02-16 LAB — BASIC METABOLIC PANEL
BUN: 16 mg/dL (ref 6–23)
CALCIUM: 11.1 mg/dL — AB (ref 8.4–10.5)
CO2: 28 meq/L (ref 19–32)
Chloride: 102 mEq/L (ref 96–112)
Creatinine, Ser: 0.9 mg/dL (ref 0.4–1.2)
GFR: 61.5 mL/min (ref >60.00)
GLUCOSE: 83 mg/dL (ref 70–99)
Potassium: 3.8 mEq/L (ref 3.5–5.1)
Sodium: 137 mEq/L (ref 135–145)

## 2013-02-16 NOTE — Progress Notes (Signed)
Wound check appointment. Steri-strips removed. Wound without redness or edema. Incision edges approximated, wound well healed. Rash present, but patient is without burning or itching. JA evaluated-no changes made. Normal device function. Thresholds, sensing, and impedances consistent with implant measurements. Device programmed at appropriate safety margins. Histogram distribution appropriate for patient and level of activity. No mode switches or high ventricular rates noted. Patient educated about wound care, arm mobility, lifting restrictions. ROV on 2-4 for a wound recheck.

## 2013-02-16 NOTE — Telephone Encounter (Signed)
New Message  Pt called states that she has a rash that is covering her chest//Pt believes that it is due to the pacer// requesting a call back from the nurse//SR

## 2013-02-16 NOTE — Telephone Encounter (Signed)
New Message  Pt called states she has a rash that covers her chest. She believes that it is due to the Pacer// Requests a call back to discuss. Please call

## 2013-02-16 NOTE — Telephone Encounter (Signed)
Spoke with patient, she is going to come in this am for her wound check.  I have rescheduled both wound check and lbs for today

## 2013-02-18 ENCOUNTER — Ambulatory Visit: Payer: Medicare Other

## 2013-02-18 ENCOUNTER — Other Ambulatory Visit: Payer: Medicare Other

## 2013-02-18 ENCOUNTER — Ambulatory Visit (INDEPENDENT_AMBULATORY_CARE_PROVIDER_SITE_OTHER): Payer: Medicare Other | Admitting: *Deleted

## 2013-02-18 DIAGNOSIS — Z950 Presence of cardiac pacemaker: Secondary | ICD-10-CM

## 2013-02-18 LAB — MDC_IDC_ENUM_SESS_TYPE_INCLINIC

## 2013-02-18 NOTE — Progress Notes (Signed)
Wound re-check from 02/16/13. Removed 2 steri-strips applied on 02/16/13. Wound/rash looking much better. Pt released from shower restriction.   ROV w/ Dr. Johney FrameAllred 05/20/13 @ 2:15

## 2013-03-19 DIAGNOSIS — R269 Unspecified abnormalities of gait and mobility: Secondary | ICD-10-CM | POA: Insufficient documentation

## 2013-04-22 ENCOUNTER — Encounter: Payer: Self-pay | Admitting: Internal Medicine

## 2013-05-20 ENCOUNTER — Encounter: Payer: Self-pay | Admitting: Internal Medicine

## 2013-05-20 ENCOUNTER — Ambulatory Visit (INDEPENDENT_AMBULATORY_CARE_PROVIDER_SITE_OTHER): Payer: Medicare Other | Admitting: Internal Medicine

## 2013-05-20 VITALS — BP 130/72 | HR 70 | Ht 65.0 in | Wt 127.4 lb

## 2013-05-20 DIAGNOSIS — I495 Sick sinus syndrome: Secondary | ICD-10-CM

## 2013-05-20 DIAGNOSIS — E785 Hyperlipidemia, unspecified: Secondary | ICD-10-CM

## 2013-05-20 DIAGNOSIS — Z950 Presence of cardiac pacemaker: Secondary | ICD-10-CM

## 2013-05-20 LAB — MDC_IDC_ENUM_SESS_TYPE_INCLINIC
Battery Impedance: 100 Ohm
Brady Statistic AP VS Percent: 100 %
Brady Statistic AS VS Percent: 0 %
Date Time Interrogation Session: 20150506144347
Lead Channel Impedance Value: 355 Ohm
Lead Channel Impedance Value: 626 Ohm
Lead Channel Pacing Threshold Amplitude: 0.5 V
Lead Channel Pacing Threshold Pulse Width: 0.4 ms
Lead Channel Pacing Threshold Pulse Width: 0.4 ms
Lead Channel Sensing Intrinsic Amplitude: 5.6 mV
Lead Channel Setting Pacing Amplitude: 2 V
Lead Channel Setting Sensing Sensitivity: 2 mV
MDC IDC MSMT BATTERY REMAINING LONGEVITY: 148 mo
MDC IDC MSMT BATTERY VOLTAGE: 2.8 V
MDC IDC MSMT LEADCHNL RA PACING THRESHOLD AMPLITUDE: 0.75 V
MDC IDC SET LEADCHNL RV PACING AMPLITUDE: 2.5 V
MDC IDC SET LEADCHNL RV PACING PULSEWIDTH: 0.4 ms
MDC IDC STAT BRADY AP VP PERCENT: 0 %
MDC IDC STAT BRADY AS VP PERCENT: 0 %

## 2013-05-20 NOTE — Patient Instructions (Addendum)
Your physician wants you to follow-up in: 12 months with Dr Jacquiline DoeAllred You will receive a reminder letter in the mail two months in advance. If you don't receive a letter, please call our office to schedule the follow-up appointment.  Remote monitoring is used to monitor your Pacemaker or ICD from home. This monitoring reduces the number of office visits required to check your device to one time per year. It allows us to keep an eye on the functioning of your device to ensure it is working properly. You are scheduled for a device check from home on 08/20/13. You may send your transmission at any time that day. If you have a wireless device, the transmission will be sent automatically. After your physician reviews your transmission, you will receive a postcard with your next transmission date.

## 2013-05-20 NOTE — Progress Notes (Signed)
PCP: Ezequiel KayserPERINI,MARK A, MD  Zadie CleverlyBetsy M Atkins is a 80 y.o. female who presents today for routine electrophysiology followup.  Since her recent generator change, the patient reports doing very well.  She denies procedure related complications.  Today, she denies symptoms of palpitations, chest pain, shortness of breath,  lower extremity edema, dizziness, presyncope, or syncope.  The patient is otherwise without complaint today.   Past Medical History  Diagnosis Date  . MVP (mitral valve prolapse)   . Hyperlipidemia   . OP (osteoporosis)   . SSS (sick sinus syndrome)     s/p PTVP  . Hypothyroidism   . Broken foot     Broken right foot and broken left knee in 2011  . S/P cardiac pacemaker procedure     Original implant 1998 with generator change in 2005. She has had prior lead revision.   . Knee cap dislocation 2011    left  . Hx of migraine headaches   . Benign recurrent vertigo   . Diverticulosis   . Shingles   . GERD (gastroesophageal reflux disease)   . Wrist fracture     lt  . Cataracts, bilateral   . Hemorrhoids    Past Surgical History  Procedure Laterality Date  . Total hip arthroplasty      right  . Pacemaker insertion  1998/2005    TRANSVENOUS PACEMAKER  . [place] echocardiography  11/25/2008    EF 55-60%; mild to moderate AI  . Cardiovascular stress test  07/10/2005    EF 78%, NO ISCHEMIA  . Hemorroidectomy    . Orif femur fracture Right 04/29/2012    Procedure: OPEN REDUCTION INTERNAL FIXATION (ORIF) DISTAL FEMUR FRACTURE;  Surgeon: Eulas PostJoshua P Landau, MD;  Location: [facility] OR;  Service: Orthopedics;  Laterality: Right;  biomet long condylar plate, flat jackson, cerclage wires, big c arm  . Pacemaker generator change  02/09/13    MDT Adapta L gen change by Dr Johney FrameAllred    Current Outpatient Prescriptions  Medication Sig Dispense Refill  . aspirin EC 81 MG tablet Take 81 mg by mouth daily.      . Calcium Carb-Cholecalciferol (CALCIUM 600 + D PO) Take 1,200 mg by mouth daily.      .  cholecalciferol (VITAMIN D) 1000 UNITS tablet Take 1,000 Units by mouth daily.        . fish oil-omega-3 fatty acids 1000 MG capsule Take 1 g by mouth daily.       Marland Kitchen. levothyroxine (SYNTHROID, LEVOTHROID) 75 MCG tablet Take 75 mcg by mouth daily before breakfast.      . Multiple Vitamin (MULTIVITAMIN WITH MINERALS) TABS tablet Take 1 tablet by mouth daily.      . pravastatin (PRAVACHOL) 20 MG tablet Take 20 mg by mouth at bedtime.       . Teriparatide, Recombinant, (FORTEO) 600 MCG/2.4ML SOLN Inject 20 mcg into the skin daily after breakfast.       No current facility-administered medications for this visit.    Physical Exam: Filed Vitals:   05/20/13 1422  BP: 130/72  Pulse: 70  Height: 5\' 5"  (1.651 m)  Weight: 127 lb 6.4 oz (57.788 kg)    GEN- The patient is well appearing, alert and oriented x 3 today.   Head- normocephalic, atraumatic Eyes-  Sclera clear, conjunctiva pink Ears- hearing intact Oropharynx- clear Lungs- Clear to ausculation bilaterally, normal work of breathing Chest- pacemaker pocket is well healed Heart- Regular rate and rhythm, no murmurs, rubs or gallops, PMI not laterally  displaced GI- soft, NT, ND, + BS Extremities- no clubbing, cyanosis, or edema  Pacemaker interrogation- reviewed in detail today,  See PACEART report  Assessment and Plan:  1. Sick sinus syndrome Normal pacemaker function See Pace Art report No changes today  2. HL Stable No change required today  carelink Return to see me in 1 year

## 2013-09-04 ENCOUNTER — Other Ambulatory Visit: Payer: Self-pay | Admitting: Internal Medicine

## 2013-09-04 ENCOUNTER — Ambulatory Visit (INDEPENDENT_AMBULATORY_CARE_PROVIDER_SITE_OTHER): Payer: Medicare Other | Admitting: *Deleted

## 2013-09-04 ENCOUNTER — Telehealth: Payer: Self-pay | Admitting: Cardiology

## 2013-09-04 DIAGNOSIS — I495 Sick sinus syndrome: Secondary | ICD-10-CM

## 2013-09-04 NOTE — Telephone Encounter (Signed)
I informed pt that her transmission was due on 08-20-13 and that she can send a transmission anytime. I also informed pt that a letter will be mailed out in 2-3 weeks with her next transmission date. Pt verbalized understanding.

## 2013-09-07 NOTE — Progress Notes (Signed)
Remote pacemaker transmission.   

## 2013-09-09 LAB — MDC_IDC_ENUM_SESS_TYPE_REMOTE
Battery Impedance: 100 Ohm
Battery Remaining Longevity: 149 mo
Brady Statistic AP VP Percent: 0 %
Brady Statistic AS VP Percent: 0 %
Brady Statistic AS VS Percent: 0 %
Date Time Interrogation Session: 20150821175548
Lead Channel Impedance Value: 368 Ohm
Lead Channel Impedance Value: 671 Ohm
Lead Channel Pacing Threshold Amplitude: 0.875 V
Lead Channel Sensing Intrinsic Amplitude: 5.6 mV
Lead Channel Setting Pacing Amplitude: 2.5 V
Lead Channel Setting Pacing Pulse Width: 0.4 ms
Lead Channel Setting Sensing Sensitivity: 2 mV
MDC IDC MSMT BATTERY VOLTAGE: 2.8 V
MDC IDC MSMT LEADCHNL RA PACING THRESHOLD PULSEWIDTH: 0.4 ms
MDC IDC MSMT LEADCHNL RV PACING THRESHOLD AMPLITUDE: 0.875 V
MDC IDC MSMT LEADCHNL RV PACING THRESHOLD PULSEWIDTH: 0.4 ms
MDC IDC SET LEADCHNL RA PACING AMPLITUDE: 2 V
MDC IDC STAT BRADY AP VS PERCENT: 100 %

## 2013-09-15 ENCOUNTER — Encounter: Payer: Self-pay | Admitting: Cardiology

## 2013-09-24 ENCOUNTER — Encounter: Payer: Self-pay | Admitting: Internal Medicine

## 2013-12-09 ENCOUNTER — Ambulatory Visit (INDEPENDENT_AMBULATORY_CARE_PROVIDER_SITE_OTHER): Payer: Medicare Other | Admitting: *Deleted

## 2013-12-09 ENCOUNTER — Telehealth: Payer: Self-pay | Admitting: Cardiology

## 2013-12-09 DIAGNOSIS — I495 Sick sinus syndrome: Secondary | ICD-10-CM

## 2013-12-09 NOTE — Telephone Encounter (Signed)
LMOVM reminding pt to send remote transmission.   

## 2013-12-14 ENCOUNTER — Encounter: Payer: Self-pay | Admitting: Internal Medicine

## 2013-12-14 DIAGNOSIS — I495 Sick sinus syndrome: Secondary | ICD-10-CM

## 2013-12-14 DIAGNOSIS — M545 Low back pain, unspecified: Secondary | ICD-10-CM | POA: Insufficient documentation

## 2013-12-15 NOTE — Progress Notes (Signed)
Remote pacemaker transmission.   

## 2013-12-16 LAB — MDC_IDC_ENUM_SESS_TYPE_REMOTE
Battery Remaining Longevity: 148 mo
Date Time Interrogation Session: 20151130144620
Lead Channel Pacing Threshold Amplitude: 0.75 V
Lead Channel Pacing Threshold Pulse Width: 0.4 ms
Lead Channel Sensing Intrinsic Amplitude: 5.6 mV
Lead Channel Setting Pacing Amplitude: 2 V
Lead Channel Setting Sensing Sensitivity: 2.8 mV
MDC IDC MSMT BATTERY IMPEDANCE: 100 Ohm
MDC IDC MSMT BATTERY VOLTAGE: 2.8 V
MDC IDC MSMT LEADCHNL RA IMPEDANCE VALUE: 649 Ohm
MDC IDC MSMT LEADCHNL RA PACING THRESHOLD AMPLITUDE: 0.75 V
MDC IDC MSMT LEADCHNL RA PACING THRESHOLD PULSEWIDTH: 0.4 ms
MDC IDC MSMT LEADCHNL RV IMPEDANCE VALUE: 365 Ohm
MDC IDC SET LEADCHNL RV PACING AMPLITUDE: 2.5 V
MDC IDC SET LEADCHNL RV PACING PULSEWIDTH: 0.4 ms
MDC IDC STAT BRADY AP VP PERCENT: 0 %
MDC IDC STAT BRADY AP VS PERCENT: 100 %
MDC IDC STAT BRADY AS VP PERCENT: 0 %
MDC IDC STAT BRADY AS VS PERCENT: 0 %

## 2013-12-22 ENCOUNTER — Encounter: Payer: Self-pay | Admitting: Cardiology

## 2013-12-24 ENCOUNTER — Encounter (HOSPITAL_COMMUNITY): Payer: Self-pay | Admitting: Internal Medicine

## 2013-12-24 ENCOUNTER — Other Ambulatory Visit: Payer: Self-pay | Admitting: Internal Medicine

## 2013-12-24 DIAGNOSIS — M545 Low back pain, unspecified: Secondary | ICD-10-CM

## 2013-12-24 DIAGNOSIS — M79605 Pain in left leg: Secondary | ICD-10-CM

## 2013-12-31 ENCOUNTER — Ambulatory Visit
Admission: RE | Admit: 2013-12-31 | Discharge: 2013-12-31 | Disposition: A | Discharge status: Home / Self Care | Payer: Medicare Other | Source: Ambulatory Visit | Attending: Internal Medicine | Admitting: Internal Medicine

## 2013-12-31 DIAGNOSIS — M545 Low back pain, unspecified: Secondary | ICD-10-CM

## 2013-12-31 DIAGNOSIS — M79605 Pain in left leg: Secondary | ICD-10-CM

## 2014-01-11 ENCOUNTER — Other Ambulatory Visit: Payer: Self-pay | Admitting: Internal Medicine

## 2014-01-11 DIAGNOSIS — M545 Low back pain, unspecified: Secondary | ICD-10-CM

## 2014-01-13 ENCOUNTER — Ambulatory Visit
Admission: RE | Admit: 2014-01-13 | Discharge: 2014-01-13 | Disposition: A | Discharge status: Home / Self Care | Payer: Medicare Other | Source: Ambulatory Visit | Attending: Internal Medicine | Admitting: Internal Medicine

## 2014-01-13 DIAGNOSIS — M545 Low back pain, unspecified: Secondary | ICD-10-CM

## 2014-01-13 MED ORDER — IOHEXOL 180 MG/ML  SOLN
1.0000 mL | Freq: Once | INTRAMUSCULAR | Status: AC | PRN
  Administered 2014-01-13: 1 mL via EPIDURAL

## 2014-01-13 MED ORDER — METHYLPREDNISOLONE ACETATE 40 MG/ML INJ SUSP (RADIOLOG
120.0000 mg | Freq: Once | INTRAMUSCULAR | Status: AC
  Administered 2014-01-13: 120 mg via EPIDURAL

## 2014-01-13 NOTE — Discharge Instructions (Signed)

## 2014-01-25 ENCOUNTER — Other Ambulatory Visit: Payer: Self-pay | Admitting: Internal Medicine

## 2014-01-25 DIAGNOSIS — M545 Low back pain, unspecified: Secondary | ICD-10-CM

## 2014-01-28 ENCOUNTER — Other Ambulatory Visit: Payer: Self-pay | Admitting: Internal Medicine

## 2014-01-28 ENCOUNTER — Ambulatory Visit
Admission: RE | Admit: 2014-01-28 | Discharge: 2014-01-28 | Disposition: A | Discharge status: Home / Self Care | Payer: Medicare Other | Source: Ambulatory Visit | Attending: Internal Medicine | Admitting: Internal Medicine

## 2014-01-28 VITALS — BP 146/75 | HR 68

## 2014-01-28 DIAGNOSIS — M47817 Spondylosis without myelopathy or radiculopathy, lumbosacral region: Secondary | ICD-10-CM

## 2014-01-28 DIAGNOSIS — M545 Low back pain, unspecified: Secondary | ICD-10-CM

## 2014-01-28 MED ORDER — METHYLPREDNISOLONE ACETATE 40 MG/ML INJ SUSP (RADIOLOG
120.0000 mg | Freq: Once | INTRAMUSCULAR | Status: AC
  Administered 2014-01-28: 120 mg via EPIDURAL

## 2014-01-28 MED ORDER — IOHEXOL 180 MG/ML  SOLN
1.0000 mL | Freq: Once | INTRAMUSCULAR | Status: AC | PRN
Start: 2014-01-28 — End: 2014-01-28
  Administered 2014-01-28: 1 mL via EPIDURAL

## 2014-02-16 ENCOUNTER — Other Ambulatory Visit: Payer: Self-pay | Admitting: Internal Medicine

## 2014-02-16 DIAGNOSIS — M5440 Lumbago with sciatica, unspecified side: Secondary | ICD-10-CM

## 2014-02-17 ENCOUNTER — Ambulatory Visit
Admission: RE | Admit: 2014-02-17 | Discharge: 2014-02-17 | Disposition: A | Discharge status: Home / Self Care | Payer: Medicare Other | Source: Ambulatory Visit | Attending: Internal Medicine | Admitting: Internal Medicine

## 2014-02-17 ENCOUNTER — Other Ambulatory Visit: Payer: Self-pay | Admitting: Internal Medicine

## 2014-02-17 DIAGNOSIS — M5440 Lumbago with sciatica, unspecified side: Secondary | ICD-10-CM

## 2014-02-17 MED ORDER — METHYLPREDNISOLONE ACETATE 40 MG/ML INJ SUSP (RADIOLOG
120.0000 mg | Freq: Once | INTRAMUSCULAR | Status: AC
  Administered 2014-02-17: 120 mg via EPIDURAL

## 2014-02-17 MED ORDER — IOHEXOL 180 MG/ML  SOLN
1.0000 mL | Freq: Once | INTRAMUSCULAR | Status: AC | PRN
  Administered 2014-02-17: 1 mL via EPIDURAL

## 2014-03-15 ENCOUNTER — Telehealth: Payer: Self-pay | Admitting: Cardiology

## 2014-03-15 ENCOUNTER — Encounter: Payer: Medicare Other | Admitting: *Deleted

## 2014-03-15 NOTE — Telephone Encounter (Signed)
Spoke with pt and reminded pt of remote transmission that is due today. Pt verbalized understanding.   

## 2014-03-16 ENCOUNTER — Encounter: Payer: Self-pay | Admitting: Cardiology

## 2014-04-22 ENCOUNTER — Emergency Department (HOSPITAL_COMMUNITY): Payer: Medicare Other

## 2014-04-22 ENCOUNTER — Observation Stay (HOSPITAL_COMMUNITY)
Admission: EM | Admit: 2014-04-22 | Discharge: 2014-04-23 | Disposition: A | Discharge status: Home / Self Care | Payer: Medicare Other | Attending: Internal Medicine | Admitting: Internal Medicine

## 2014-04-22 ENCOUNTER — Encounter (HOSPITAL_COMMUNITY): Payer: Self-pay | Admitting: Emergency Medicine

## 2014-04-22 ENCOUNTER — Observation Stay (HOSPITAL_COMMUNITY): Payer: Medicare Other

## 2014-04-22 ENCOUNTER — Ambulatory Visit (HOSPITAL_COMMUNITY): Payer: Medicare Other

## 2014-04-22 DIAGNOSIS — K5790 Diverticulosis of intestine, part unspecified, without perforation or abscess without bleeding: Secondary | ICD-10-CM | POA: Diagnosis not present

## 2014-04-22 DIAGNOSIS — H269 Unspecified cataract: Secondary | ICD-10-CM | POA: Insufficient documentation

## 2014-04-22 DIAGNOSIS — Z79899 Other long term (current) drug therapy: Secondary | ICD-10-CM | POA: Diagnosis not present

## 2014-04-22 DIAGNOSIS — R52 Pain, unspecified: Secondary | ICD-10-CM

## 2014-04-22 DIAGNOSIS — I341 Nonrheumatic mitral (valve) prolapse: Secondary | ICD-10-CM | POA: Diagnosis not present

## 2014-04-22 DIAGNOSIS — R079 Chest pain, unspecified: Secondary | ICD-10-CM | POA: Diagnosis not present

## 2014-04-22 DIAGNOSIS — H8110 Benign paroxysmal vertigo, unspecified ear: Secondary | ICD-10-CM | POA: Insufficient documentation

## 2014-04-22 DIAGNOSIS — E039 Hypothyroidism, unspecified: Secondary | ICD-10-CM | POA: Diagnosis present

## 2014-04-22 DIAGNOSIS — Z7982 Long term (current) use of aspirin: Secondary | ICD-10-CM | POA: Diagnosis not present

## 2014-04-22 DIAGNOSIS — E785 Hyperlipidemia, unspecified: Secondary | ICD-10-CM | POA: Insufficient documentation

## 2014-04-22 DIAGNOSIS — M810 Age-related osteoporosis without current pathological fracture: Secondary | ICD-10-CM | POA: Insufficient documentation

## 2014-04-22 DIAGNOSIS — M79622 Pain in left upper arm: Secondary | ICD-10-CM | POA: Diagnosis not present

## 2014-04-22 DIAGNOSIS — D649 Anemia, unspecified: Secondary | ICD-10-CM | POA: Diagnosis not present

## 2014-04-22 DIAGNOSIS — M79602 Pain in left arm: Secondary | ICD-10-CM

## 2014-04-22 DIAGNOSIS — Z950 Presence of cardiac pacemaker: Secondary | ICD-10-CM | POA: Diagnosis not present

## 2014-04-22 DIAGNOSIS — K219 Gastro-esophageal reflux disease without esophagitis: Secondary | ICD-10-CM | POA: Insufficient documentation

## 2014-04-22 DIAGNOSIS — I495 Sick sinus syndrome: Secondary | ICD-10-CM | POA: Diagnosis present

## 2014-04-22 HISTORY — DX: Other intervertebral disc degeneration, lumbar region without mention of lumbar back pain or lower extremity pain: M51.369

## 2014-04-22 HISTORY — DX: Radiculopathy, lumbar region: M54.16

## 2014-04-22 HISTORY — DX: Other intervertebral disc degeneration, lumbar region: M51.36

## 2014-04-22 LAB — BASIC METABOLIC PANEL
Anion gap: 8 (ref 5–15)
BUN: 17 mg/dL (ref 6–23)
CALCIUM: 9.7 mg/dL (ref 8.4–10.5)
CHLORIDE: 105 mmol/L (ref 96–112)
CO2: 24 mmol/L (ref 19–32)
CREATININE: 0.74 mg/dL (ref 0.50–1.10)
GFR calc Af Amer: 86 mL/min — ABNORMAL LOW (ref >90)
GFR calc non Af Amer: 74 mL/min — ABNORMAL LOW (ref >90)
Glucose, Bld: 79 mg/dL (ref 70–99)
Potassium: 4.4 mmol/L (ref 3.5–5.1)
Sodium: 137 mmol/L (ref 135–145)

## 2014-04-22 LAB — CBC
HCT: 34.4 % — ABNORMAL LOW (ref 36.0–46.0)
Hemoglobin: 11 g/dL — ABNORMAL LOW (ref 12.0–15.0)
MCH: 29.6 pg (ref 26.0–34.0)
MCHC: 32 g/dL (ref 30.0–36.0)
MCV: 92.7 fL (ref 78.0–100.0)
PLATELETS: 167 10*3/uL (ref 150–400)
RBC: 3.71 MIL/uL — ABNORMAL LOW (ref 3.87–5.11)
RDW: 14.8 % (ref 11.5–15.5)
WBC: 6.3 10*3/uL (ref 4.0–10.5)

## 2014-04-22 LAB — I-STAT TROPONIN, ED
TROPONIN I, POC: 0 ng/mL (ref 0.00–0.08)
Troponin i, poc: 0 ng/mL (ref 0.00–0.08)

## 2014-04-22 LAB — D-DIMER, QUANTITATIVE (NOT AT ARMC): D-Dimer, Quant: 0.72 ug/mL-FEU — ABNORMAL HIGH (ref 0.00–0.48)

## 2014-04-22 LAB — TROPONIN I: Troponin I: <0.03 ng/mL (ref <0.031)

## 2014-04-22 MED ORDER — ACETAMINOPHEN 325 MG PO TABS: 650.0000 mg | ORAL_TABLET | Freq: Four times a day (QID) | ORAL | Status: DC | PRN

## 2014-04-22 MED ORDER — IBUPROFEN 200 MG PO TABS
400.0000 mg | ORAL_TABLET | Freq: Three times a day (TID) | ORAL | Status: DC
  Administered 2014-04-22: 400 mg via ORAL
  Filled 2014-04-22 (×2): qty 2

## 2014-04-22 MED ORDER — ASPIRIN 325 MG PO TABS
325.0000 mg | ORAL_TABLET | Freq: Once | ORAL | Status: DC
  Filled 2014-04-22: qty 1

## 2014-04-22 MED ORDER — ENOXAPARIN SODIUM 40 MG/0.4ML ~~LOC~~ SOLN
40.0000 mg | SUBCUTANEOUS | Status: DC
  Administered 2014-04-22: 40 mg via SUBCUTANEOUS
  Filled 2014-04-22: qty 0.4

## 2014-04-22 MED ORDER — SODIUM CHLORIDE 0.9 % IJ SOLN: 3.0000 mL | INTRAMUSCULAR | Status: DC | PRN

## 2014-04-22 MED ORDER — SODIUM CHLORIDE 0.9 % IJ SOLN: 3.0000 mL | Freq: Two times a day (BID) | INTRAMUSCULAR | Status: DC

## 2014-04-22 MED ORDER — PRAVASTATIN SODIUM 20 MG PO TABS
20.0000 mg | ORAL_TABLET | Freq: Every day | ORAL | Status: DC
  Administered 2014-04-22: 20 mg via ORAL
  Filled 2014-04-22: qty 1

## 2014-04-22 MED ORDER — ALBUTEROL SULFATE (2.5 MG/3ML) 0.083% IN NEBU: 2.5000 mg | INHALATION_SOLUTION | RESPIRATORY_TRACT | Status: DC | PRN

## 2014-04-22 MED ORDER — MORPHINE SULFATE 2 MG/ML IJ SOLN
1.0000 mg | INTRAMUSCULAR | Status: DC | PRN
  Administered 2014-04-22: 1 mg via INTRAVENOUS
  Filled 2014-04-22: qty 1

## 2014-04-22 MED ORDER — LEVOTHYROXINE SODIUM 25 MCG PO TABS
50.0000 ug | ORAL_TABLET | Freq: Every day | ORAL | Status: DC
  Filled 2014-04-22: qty 2

## 2014-04-22 MED ORDER — SODIUM CHLORIDE 0.9 % IV SOLN: 250.0000 mL | INTRAVENOUS | Status: DC | PRN

## 2014-04-22 MED ORDER — ACETAMINOPHEN 650 MG RE SUPP
650.0000 mg | Freq: Four times a day (QID) | RECTAL | Status: DC | PRN
Start: 2014-04-22 — End: 2014-04-23

## 2014-04-22 MED ORDER — IOHEXOL 350 MG/ML SOLN
100.0000 mL | Freq: Once | INTRAVENOUS | Status: AC | PRN
  Administered 2014-04-22: 100 mL via INTRAVENOUS

## 2014-04-22 MED ORDER — OXYCODONE HCL 5 MG PO TABS
5.0000 mg | ORAL_TABLET | ORAL | Status: DC | PRN
  Administered 2014-04-23: 5 mg via ORAL
  Filled 2014-04-22 (×2): qty 1

## 2014-04-22 MED ORDER — ONDANSETRON HCL 4 MG PO TABS: 4.0000 mg | ORAL_TABLET | Freq: Four times a day (QID) | ORAL | Status: DC | PRN

## 2014-04-22 MED ORDER — ONDANSETRON HCL 4 MG/2ML IJ SOLN: 4.0000 mg | Freq: Four times a day (QID) | INTRAMUSCULAR | Status: DC | PRN

## 2014-04-22 MED ORDER — SODIUM CHLORIDE 0.9 % IJ SOLN
3.0000 mL | Freq: Two times a day (BID) | INTRAMUSCULAR | Status: DC
  Administered 2014-04-22: 3 mL via INTRAVENOUS

## 2014-04-22 NOTE — H&P (Signed)
[facility] History and Physical  Robin Atkins:[id number] DOB: [birth year] DOA: 04/22/2014   PCP: Jerlyn Ly, MD  Specialists: Dr. Sherwood Gambler is her neurosurgeon for lower back issues  Chief Complaint: Left arm pain with fleeting chest pains  HPI: Robin Atkins is a 81 y.o. female with a past medical history of sick sinus syndrome status post pacemaker placement, L5 nerve impingement, hypothyroidism, who was in her usual state of health until last night when she developed pain in the left arm. The pain was from the left elbow up to the shoulder. She had pain all night long. She had a couple of episodes of fleeting chest pains as well, but that didn't last more than a few minutes. Lying down, apparently increases this pain. She called her primary care physician this morning and was asked to come into the emergency department. Denies any shortness of breath, cough, fever. No palpitations. Reports one episode of fleeting lightheadedness, but no syncopal episodes. Denies any chest pain currently. Denies any shortness of breath currently. She's never had similar symptoms before. Denies any history of trauma or falls. Patient does tell me that she took a road trip to [place] a few days ago. She did take a lot of breaks along the way.  Home Medications: Prior to Admission medications   Medication Sig Start Date End Date Taking? Authorizing Provider  aspirin 325 MG tablet Take 325 mg by mouth once.   Yes Historical Provider, MD  aspirin EC 81 MG tablet Take 81 mg by mouth daily.   Yes Historical Provider, MD  cholecalciferol (VITAMIN D) 1000 UNITS tablet Take 1,000 Units by mouth daily.     Yes Historical Provider, MD  diclofenac (VOLTAREN) 75 MG EC tablet Take 75 mg by mouth 2 (two) times daily as needed for mild pain.  03/09/14  Yes Historical Provider, MD  fish oil-omega-3 fatty acids 1000 MG capsule Take 1 g by mouth daily.    Yes Historical Provider, MD  levothyroxine (SYNTHROID,  LEVOTHROID) 50 MCG tablet Take 1 tablet by mouth daily. 03/23/14  Yes Historical Provider, MD  pravastatin (PRAVACHOL) 20 MG tablet Take 20 mg by mouth at bedtime.    Yes Historical Provider, MD  Teriparatide, Recombinant, (FORTEO) 600 MCG/2.4ML SOLN Inject 20 mcg into the skin daily after breakfast.   Yes Historical Provider, MD  traMADol (ULTRAM) 50 MG tablet Take 1 tablet by mouth every 4 (four) hours as needed for moderate pain.  04/20/14  Yes Historical Provider, MD    Allergies: No Known Allergies  Past Medical History: Past Medical History  Diagnosis Date  . MVP (mitral valve prolapse)   . Hyperlipidemia   . OP (osteoporosis)   . SSS (sick sinus syndrome)     s/p PTVP  . Hypothyroidism   . Broken foot     Broken right foot and broken left knee in 2011  . S/P cardiac pacemaker procedure     Original implant 1998 with generator change in 2005. She has had prior lead revision.   . Knee cap dislocation 2011    left  . Hx of migraine headaches   . Benign recurrent vertigo   . Diverticulosis   . Shingles   . GERD (gastroesophageal reflux disease)   . Wrist fracture     lt  . Cataracts, bilateral   . Hemorrhoids   . DDD (degenerative disc disease), lumbar   . Lumbar radiculopathy     Past Surgical History  Procedure Laterality Date  .  Total hip arthroplasty      right  . Pacemaker insertion  1998/2005    TRANSVENOUS PACEMAKER  . US echocardiography  11/25/2008    EF 55-60%; mild to moderate AI  . Cardiovascular stress test  07/10/2005    EF 78%, NO ISCHEMIA  . Hemorroidectomy    . Orif femur fracture Right 04/29/2012    Procedure: OPEN REDUCTION INTERNAL FIXATION (ORIF) DISTAL FEMUR FRACTURE;  Surgeon: Johnny Bridge, MD;  Location: [facility];  Service: Orthopedics;  Laterality: Right;  biomet long condylar plate, flat jackson, cerclage wires, big c arm  . Pacemaker generator change  02/09/13    MDT Adapta L gen change by Dr Rayann Heman  . Pacemaker generator change N/A 02/09/2013      Procedure: PACEMAKER GENERATOR CHANGE;  Surgeon: Coralyn Mark, MD;  Location: [facility] CATH LAB;  Service: Cardiovascular;  Laterality: N/A;    Social History: She lives in [place] with her husband. No smoking, alcohol use or illicit drug use. She walks with a cane.  Family History:  Family History  Problem Relation Age of Onset  . Heart attack Mother   . Cancer Father     brain tumour  . Lung cancer Brother     x2  . Diabetes Sister     x 2 brothers  . Lymphoma Sister      Review of Systems - History obtained from the patient General ROS: negative Psychological ROS: positive for - anxiety Ophthalmic ROS: negative ENT ROS: negative Allergy and Immunology ROS: negative Hematological and Lymphatic ROS: negative Endocrine ROS: negative Respiratory ROS: no cough, shortness of breath, or wheezing Cardiovascular ROS: as in hpi Gastrointestinal ROS: no abdominal pain, change in bowel habits, or black or bloody stools Genito-Urinary ROS: no dysuria, trouble voiding, or hematuria Musculoskeletal ROS: as in hpi Neurological ROS: no TIA or stroke symptoms Dermatological ROS: negative  Physical Examination  Filed Vitals:   04/22/14 1047 04/22/14 1316  BP: 161/62 145/56  Pulse: 61 63  Temp: 98.6 F (37 C) 98.4 F (36.9 C)  TempSrc: Oral Oral  Resp: 20 20  SpO2: 100% 100%    BP 145/56 mmHg  Pulse 63  Temp(Src) 98.4 F (36.9 C) (Oral)  Resp 20  SpO2 100%  General appearance: alert, cooperative, appears stated age and no distress Head: Normocephalic, without obvious abnormality, atraumatic Eyes: conjunctivae/corneas clear. PERRL, EOM's intact.  Throat: lips, mucosa, and tongue normal; teeth and gums normal Neck: no adenopathy, no carotid bruit, no JVD, supple, symmetrical, trachea midline, thyroid not enlarged, symmetric, no tenderness/mass/nodules and Full range of motion Back: symmetric, no curvature. ROM normal. No CVA tenderness. Resp: clear to auscultation  bilaterally Chest wall: no tenderness Cardio: regular rate and rhythm, S1, S2 normal, no murmur, click, rub or gallop GI: soft, non-tender; bowel sounds normal; no masses,  no organomegaly Extremities: Left upper extremity was examined. Full range of motion of the wrist, elbow. Some limitation in range of motion of the left shoulder. No reproducibility of her pain. No pedal edema. Pulses: 2+ and symmetric Skin: Skin color, texture, turgor normal. No rashes or lesions Lymph nodes: Cervical, supraclavicular, and axillary nodes normal. Neurologic: No focal neurological deficits.  Laboratory Data: Results for orders placed or performed during the hospital encounter of 04/22/14 (from the past 48 hour(s))  CBC     Status: Abnormal   Collection Time: 04/22/14 10:50 AM  Result Value Ref Range   WBC 6.3 4.0 - 10.5 K/uL   RBC 3.71 (L)  3.87 - 5.11 MIL/uL   Hemoglobin 11.0 (L) 12.0 - 15.0 g/dL   HCT 34.4 (L) 36.0 - 46.0 %   MCV 92.7 78.0 - 100.0 fL   MCH 29.6 26.0 - 34.0 pg   MCHC 32.0 30.0 - 36.0 g/dL   RDW 14.8 11.5 - 15.5 %   Platelets 167 150 - 400 K/uL  Basic metabolic panel     Status: Abnormal   Collection Time: 04/22/14 10:50 AM  Result Value Ref Range   Sodium 137 135 - 145 mmol/L   Potassium 4.4 3.5 - 5.1 mmol/L   Chloride 105 96 - 112 mmol/L   CO2 24 19 - 32 mmol/L   Glucose, Bld 79 70 - 99 mg/dL   BUN 17 6 - 23 mg/dL   Creatinine, Ser 0.74 0.50 - 1.10 mg/dL   Calcium 9.7 8.4 - 10.5 mg/dL   GFR calc non Af Amer 74 (L) >90 mL/min   GFR calc Af Amer 86 (L) >90 mL/min    Comment: (NOTE) The eGFR has been calculated using the CKD EPI equation. This calculation has not been validated in all clinical situations. eGFR's persistently <90 mL/min signify possible Chronic Kidney Disease.    Anion gap 8 5 - 15  I-stat troponin, ED (not at [facility])     Status: None   Collection Time: 04/22/14 11:00 AM  Result Value Ref Range   Troponin i, poc 0.00 0.00 - 0.08 ng/mL   Comment 3              Comment: Due to the release kinetics of cTnI, a negative result within the first hours of the onset of symptoms does not rule out myocardial infarction with certainty. If myocardial infarction is still suspected, repeat the test at appropriate intervals.   I-stat troponin, ED     Status: None   Collection Time: 04/22/14 12:54 PM  Result Value Ref Range   Troponin i, poc 0.00 0.00 - 0.08 ng/mL   Comment 3            Comment: Due to the release kinetics of cTnI, a negative result within the first hours of the onset of symptoms does not rule out myocardial infarction with certainty. If myocardial infarction is still suspected, repeat the test at appropriate intervals.     Radiology Reports: Dg Chest 2 View  04/22/2014   CLINICAL DATA:  LEFT arm pain since last night. Abnormal sensation in the chest. Initial encounter.  EXAM: CHEST  2 VIEW  COMPARISON:  04/28/2012.  FINDINGS: Hyperinflation is present compatible with emphysema. Blunting of the RIGHT costophrenic angle compatible with a small RIGHT pleural effusion. The cardiopericardial silhouette is within normal limits. Aortic arch atherosclerosis. RIGHT subclavian cardiac pacemaker with fractured lead inferior to the RIGHT clavicle. This fractured lead is unchanged compared to prior exam. Small fragment of the lead is present adjacent to the fracture site. This appears to be the old RIGHT atrial appendage lead.  Monitoring leads project over the chest.  No airspace disease.  On the lateral view, there is increased density over the lower thoracic spine, favored to represent atelectasis based on the frontal views. Airspace disease is considered unlikely.  IMPRESSION: 1. Tiny RIGHT pleural effusion with blunting of the RIGHT costophrenic angle on the frontal view. 2. Increased lower lobe density on the lateral view is favored to represent atelectasis based on the frontal projections. Pneumonia considered unlikely. 3. Atherosclerosis. 4.  Unchanged pacemaker.   Electronically Signed   By:  Dereck Ligas M.D.   On: 04/22/2014 12:27    Electrocardiogram: Atrial paced rhythm.  Problem List  Principal Problem:   Chest pain Active Problems:   SICK SINUS SYNDROME   Hypothyroidism   Left upper arm pain   Assessment: This is a 81 year old Caucasian female who comes in with left arm pain and fleeting chest pain. Symptoms appear to be musculoskeletal. Differential diagnoses include acute coronary syndrome which is less likely. Venous thromboembolism is a possibility considering her road trip, although symptoms are not very specific for the same. Incidental findings of small pleural effusion on chest x-ray. This is not contributing to her current symptoms.  Plan: #1 chest pain with left arm pain: As mentioned above this is most likely musculoskeletal in etiology. Although patient mentions she's never had this symptom before and denies any history of falls or trauma. Due to the history of her road trip we will proceed with d-dimer. If d-dimer is positive CT angio will be ordered. She'll be observed in the hospital overnight. Serial troponins. Physical therapy in the morning. Anti-inflammatories.  #2 History of hypothyroidism: Continue with her levothyroxin at the home dose.  #3 history of sick sinus syndrome status post pacemaker: Stable. Heart rate is normal.  #4 Normocytic anemia: Hemoglobin at baseline. No need for further workup in the hospital. We proceeded by PCP.  #5 Incidental findings of pleural effusion. very small effusion in the right. Does not need further workup in the hospital. Can be monitored as an outpatient.   DVT Prophylaxis: Lovenox Code Status: Full code Family Communication: Discussed with the patient and her husband  Disposition Plan: Observe to telemetry   Further management decisions will depend on results of further testing and patient's response to treatment.   [facility]  [facility] Pager [phone]  If 7PM-7AM, please contact night-coverage [email or web address] Password [email or web address]  04/22/2014, 2:18 PM

## 2014-04-22 NOTE — ED Provider Notes (Signed)
CSN: [id number]     Arrival date & time 04/22/14  1032 History   First MD Initiated Contact with Patient 04/22/14 1205     Chief Complaint  Patient presents with  . Chest Pain      HPI Pt was seen at 1215. Per pt, c/o gradual onset and resolution of multiple intermittent episodes of CP that occurred this morning PTA. Pt describes the CP has "heaviness" and "aching" located on the left side of her chest and radiating into her left arm. Pt unclear if her symptoms worsened on exertion. Pt states the CP lasted for 5+ minutes before resolving and returning again. Pt states the CP then completely resolved but her left arm pain continues. Pt states her left arm pain "is now going up into my shoulder and neck." Left arm pain worsens with palpation of the area and body position changes. Denies palpitations, no SOB/cough, no abd pain, no N/V/D, no back pain.    Past Medical History  Diagnosis Date  . MVP (mitral valve prolapse)   . Hyperlipidemia   . OP (osteoporosis)   . SSS (sick sinus syndrome)     s/p PTVP  . Hypothyroidism   . Broken foot     Broken right foot and broken left knee in 2011  . S/P cardiac pacemaker procedure     Original implant 1998 with generator change in 2005. She has had prior lead revision.   . Knee cap dislocation 2011    left  . Hx of migraine headaches   . Benign recurrent vertigo   . Diverticulosis   . Shingles   . GERD (gastroesophageal reflux disease)   . Wrist fracture     lt  . Cataracts, bilateral   . Hemorrhoids   . DDD (degenerative disc disease), lumbar   . Lumbar radiculopathy    Past Surgical History  Procedure Laterality Date  . Total hip arthroplasty      right  . Pacemaker insertion  1998/2005    TRANSVENOUS PACEMAKER  . US echocardiography  11/25/2008    EF 55-60%; mild to moderate AI  . Cardiovascular stress test  07/10/2005    EF 78%, NO ISCHEMIA  . Hemorroidectomy    . Orif femur fracture Right 04/29/2012    Procedure: OPEN  REDUCTION INTERNAL FIXATION (ORIF) DISTAL FEMUR FRACTURE;  Surgeon: Eulas Post, MD;  Location: [facility] OR;  Service: Orthopedics;  Laterality: Right;  biomet long condylar plate, flat jackson, cerclage wires, big c arm  . Pacemaker generator change  02/09/13    MDT Adapta L gen change by Dr [name]  . Pacemaker generator change N/A 02/09/2013    Procedure: PACEMAKER GENERATOR CHANGE;  Surgeon: Gardiner Rhyme, MD;  Location: [facility] CATH LAB;  Service: Cardiovascular;  Laterality: N/A;   Family History  Problem Relation Age of Onset  . Heart attack Mother   . Cancer Father     brain tumour  . Lung cancer Brother     x2  . Diabetes Sister     x 2 brothers  . Lymphoma Sister    History  Substance Use Topics  . Smoking status: Never Smoker   . Smokeless tobacco: Never Used  . Alcohol Use: No    Review of Systems ROS: Statement: All systems negative except as marked or noted in the HPI; Constitutional: Negative for fever and chills. ; ; Eyes: Negative for eye pain, redness and discharge. ; ; ENMT: Negative for ear pain, hoarseness, nasal congestion, sinus  pressure and sore throat. ; ; Cardiovascular: +CP. Negative for palpitations, diaphoresis, dyspnea and peripheral edema. ; ; Respiratory: Negative for cough, wheezing and stridor. ; ; Gastrointestinal: Negative for nausea, vomiting, diarrhea, abdominal pain, blood in stool, hematemesis, jaundice and rectal bleeding. . ; ; Genitourinary: Negative for dysuria, flank pain and hematuria. ; ; Musculoskeletal: +neck pain. Negative for back pain. Negative for swelling and trauma.; ; Skin: Negative for pruritus, rash, abrasions, blisters, bruising and skin lesion.; ; Neuro: Negative for headache, lightheadedness and neck stiffness. Negative for weakness, altered level of consciousness , altered mental status, extremity weakness, paresthesias, involuntary movement, seizure and syncope.      Allergies  Review of patient's allergies indicates no known  allergies.  Home Medications   Prior to Admission medications   Medication Sig Start Date End Date Taking? Authorizing Provider  aspirin 325 MG tablet Take 325 mg by mouth once.   Yes Historical Provider, MD  aspirin EC 81 MG tablet Take 81 mg by mouth daily.   Yes Historical Provider, MD  cholecalciferol (VITAMIN D) 1000 UNITS tablet Take 1,000 Units by mouth daily.     Yes Historical Provider, MD  diclofenac (VOLTAREN) 75 MG EC tablet Take 75 mg by mouth 2 (two) times daily as needed for mild pain.  03/09/14  Yes Historical Provider, MD  fish oil-omega-3 fatty acids 1000 MG capsule Take 1 g by mouth daily.    Yes Historical Provider, MD  levothyroxine (SYNTHROID, LEVOTHROID) 50 MCG tablet Take 1 tablet by mouth daily. 03/23/14  Yes Historical Provider, MD  pravastatin (PRAVACHOL) 20 MG tablet Take 20 mg by mouth at bedtime.    Yes Historical Provider, MD  Teriparatide, Recombinant, (FORTEO) 600 MCG/2.4ML SOLN Inject 20 mcg into the skin daily after breakfast.   Yes Historical Provider, MD  traMADol (ULTRAM) 50 MG tablet Take 1 tablet by mouth every 4 (four) hours as needed for moderate pain.  04/20/14  Yes Historical Provider, MD   BP 145/56 mmHg  Pulse 63  Temp(Src) 98.4 F (36.9 C) (Oral)  Resp 20  SpO2 100% Physical Exam  1220: Physical examination:  Nursing notes reviewed; Vital signs and O2 SAT reviewed;  Constitutional: Well developed, Well nourished, Well hydrated, In no acute distress; Head:  Normocephalic, atraumatic; Eyes: EOMI, PERRL, No scleral icterus; ENMT: Mouth and pharynx normal, Mucous membranes moist; Neck: Supple, Full range of motion, No lymphadenopathy; Cardiovascular: Regular rate and rhythm, No gallop; Respiratory: Breath sounds clear & equal bilaterally, No rales, rhonchi, wheezes.  Speaking full sentences with ease, Normal respiratory effort/excursion; Chest: Nontender, Movement normal; Abdomen: Soft, Nontender, Nondistended, Normal bowel sounds; Genitourinary: No CVA  tenderness; Spine:  No midline CS, TS, LS tenderness. +TTP left hypertonic trapezius muscle. No rash.;;  Extremities: Pulses normal, No tenderness, No edema, No calf edema or asymmetry.; Neuro: AA&Ox3, vague historian. Major CN grossly intact.  Speech clear. No gross focal motor or sensory deficits in extremities.; Skin: Color normal, Warm, Dry.   ED Course  Procedures     EKG Interpretation   Date/Time:  Thursday April 22 2014 10:42:32 EDT Ventricular Rate:  68 PR Interval:  209 QRS Duration: 73 QT Interval:  382 QTC Calculation: 406 R Axis:   58 Text Interpretation:  Atrial-paced rhythm Anteroseptal infarct, old  Borderline repolarization abnormality Baseline wander in lead(s) V5 No  significant change since last tracing Confirmed by Ethelda Chick  MD, SAM  [phone]) on 04/22/2014 11:34:23 AM      MDM  MDM Reviewed: previous chart,  nursing note and vitals Reviewed previous: labs and ECG Interpretation: labs, ECG, x-ray and CT scan     Results for orders placed or performed during the hospital encounter of 04/22/14  CBC  Result Value Ref Range   WBC 6.3 4.0 - 10.5 K/uL   RBC 3.71 (L) 3.87 - 5.11 MIL/uL   Hemoglobin 11.0 (L) 12.0 - 15.0 g/dL   HCT 16.134.4 (L) 09.636.0 - 04.546.0 %   MCV 92.7 78.0 - 100.0 fL   MCH 29.6 26.0 - 34.0 pg   MCHC 32.0 30.0 - 36.0 g/dL   RDW 40.914.8 81.111.5 - 91.415.5 %   Platelets 167 150 - 400 K/uL  Basic metabolic panel  Result Value Ref Range   Sodium 137 135 - 145 mmol/L   Potassium 4.4 3.5 - 5.1 mmol/L   Chloride 105 96 - 112 mmol/L   CO2 24 19 - 32 mmol/L   Glucose, Bld 79 70 - 99 mg/dL   BUN 17 6 - 23 mg/dL   Creatinine, Ser 7.820.74 0.50 - 1.10 mg/dL   Calcium 9.7 8.4 - 95.610.5 mg/dL   GFR calc non Af Amer 74 (L) >90 mL/min   GFR calc Af Amer 86 (L) >90 mL/min   Anion gap 8 5 - 15  [facility]-stat troponin, ED ([facility])  Result Value Ref Range   Troponin i, poc 0.00 0.00 - 0.08 ng/mL   Comment 3          I-stat troponin, ED  Result Value Ref Range   Troponin  i, poc 0.00 0.00 - 0.08 ng/mL   Comment 3           Dg Chest 2 View 04/22/2014   CLINICAL DATA:  LEFT arm pain since last night. Abnormal sensation in the chest. Initial encounter.  EXAM: CHEST  2 VIEW  COMPARISON:  04/28/2012.  FINDINGS: Hyperinflation is present compatible with emphysema. Blunting of the RIGHT costophrenic angle compatible with a small RIGHT pleural effusion. The cardiopericardial silhouette is within normal limits. Aortic arch atherosclerosis. RIGHT subclavian cardiac pacemaker with fractured lead inferior to the RIGHT clavicle. This fractured lead is unchanged compared to prior exam. Small fragment of the lead is present adjacent to the fracture site. This appears to be the old RIGHT atrial appendage lead.  Monitoring leads project over the chest.  No airspace disease.  On the lateral view, there is increased density over the lower thoracic spine, favored to represent atelectasis based on the frontal views. Airspace disease is considered unlikely.  IMPRESSION: 1. Tiny RIGHT pleural effusion with blunting of the RIGHT costophrenic angle on the frontal view. 2. Increased lower lobe density on the lateral view is favored to represent atelectasis based on the frontal projections. Pneumonia considered unlikely. 3. Atherosclerosis. 4. Unchanged pacemaker.   Electronically Signed   By: Andreas NewportGeoffrey  Lamke M.D.   On: 04/22/2014 12:27   Ct Cervical Spine Wo Contrast 04/22/2014   CLINICAL DATA:  LEFT arm pain with onset of symptoms last night. Posterior cervicalgia/neck pain. LEFT upper extremity radiculopathy. No injury. Cervical collar present.  EXAM: CT CERVICAL SPINE WITHOUT CONTRAST  TECHNIQUE: Multidetector CT imaging of the cervical spine was performed without intravenous contrast. Multiplanar CT image reconstructions were also generated.  COMPARISON:  Thoracic spine CT 10/23/2011.  FINDINGS: Alignment: Trace anterolisthesis of C4 on C5 appears degenerative and facet mediated. There is a thoracic  kyphosis at T2-T3 associated with the chronic T3 compression fracture which was evident on prior CT 10/23/2011. Trace anterolisthesis  of C6 on C7 appears degenerative and facet mediated.  Craniocervical junction: Atlantodental degenerative disease. The odontoid is intact. Occipital condyles appear intact.  Vertebrae: No aggressive osseous lesions. No cervical spine fracture.  Paraspinal soft tissues: Aortic branch vessel atherosclerosis. Carotid atherosclerosis.  Lung apices: Unchanged benign 6 mm RIGHT upper lobe pulmonary nodule. This appears identical to the prior exam 10/23/2011. Bilateral pleural apical scarring.  C2-C3:  Negative.  C3-C4: RIGHT-greater-than- LEFT facet arthrosis. Foramina appear adequately patent.  C4-C5: No bony central stenosis. RIGHT foraminal stenosis due to RIGHT facet arthrosis. LEFT foramen appears patent.  C5-C6: Shallow disc osteophyte complex. Central canal patent. The central canal is capacious. Right-greater-than-left foraminal stenosis associated with facet arthrosis and uncovertebral spurring.  C6-C7: Left-greater-than-right facet arthrosis. Anterior facet spurring mildly encroaches on the RIGHT neural foramen. Subchondral cysts present on both sides of the LEFT facet joint.  C7-T1:  No bony stenosis.  IMPRESSION: 1. Moderate multilevel cervical spondylosis and facet arthrosis. 2. In this patient with LEFT upper extremity radicular symptoms, left-sided degenerative disease is most pronounced at C6-C7 due to facet arthrosis. 3. Chronic T3 compression fracture.   Electronically Signed   By: Andreas Newport M.D.   On: 04/22/2014 14:22    1340:  Pt currently denies CP while sitting on the stretcher. Left sided neck/arm pain appears msk at this time; will tx symptomatically. Multiple ACS risk factors, and pt very vague historian; will CP observation admit. Dx and testing d/w pt and family.  Questions answered.  Verb understanding, agreeable to admit. T/C to [facility] Dr. Rito Ehrlich, case  discussed, including:  HPI, pertinent PM/SHx, VS/PE, dx testing, ED course and treatment:  Agreeable to admit, requests to write temporary orders, obtain observation tele bed to team [facility].    Samuel Jester, DO 04/24/14 1701

## 2014-04-22 NOTE — ED Notes (Signed)
hospitalist at bedside

## 2014-04-22 NOTE — ED Notes (Signed)
Tara..klj

## 2014-04-22 NOTE — ED Notes (Signed)
Off floor for testing 

## 2014-04-22 NOTE — ED Notes (Signed)
Per pt, states chest pain radiating to left arm that started last night-chest pain resolved-left arm pain

## 2014-04-23 DIAGNOSIS — I495 Sick sinus syndrome: Secondary | ICD-10-CM

## 2014-04-23 DIAGNOSIS — I341 Nonrheumatic mitral (valve) prolapse: Secondary | ICD-10-CM | POA: Diagnosis not present

## 2014-04-23 DIAGNOSIS — M25519 Pain in unspecified shoulder: Secondary | ICD-10-CM | POA: Insufficient documentation

## 2014-04-23 DIAGNOSIS — M810 Age-related osteoporosis without current pathological fracture: Secondary | ICD-10-CM | POA: Diagnosis not present

## 2014-04-23 DIAGNOSIS — E039 Hypothyroidism, unspecified: Secondary | ICD-10-CM | POA: Diagnosis not present

## 2014-04-23 DIAGNOSIS — M79622 Pain in left upper arm: Secondary | ICD-10-CM | POA: Diagnosis not present

## 2014-04-23 DIAGNOSIS — E785 Hyperlipidemia, unspecified: Secondary | ICD-10-CM | POA: Diagnosis not present

## 2014-04-23 DIAGNOSIS — R079 Chest pain, unspecified: Secondary | ICD-10-CM | POA: Diagnosis not present

## 2014-04-23 LAB — COMPREHENSIVE METABOLIC PANEL
ALBUMIN: 3.5 g/dL (ref 3.5–5.2)
ALK PHOS: 87 U/L (ref 39–117)
ALT: 13 U/L (ref 0–35)
AST: 16 U/L (ref 0–37)
Anion gap: 8 (ref 5–15)
BILIRUBIN TOTAL: 0.5 mg/dL (ref 0.3–1.2)
BUN: 19 mg/dL (ref 6–23)
CHLORIDE: 105 mmol/L (ref 96–112)
CO2: 25 mmol/L (ref 19–32)
Calcium: 8.9 mg/dL (ref 8.4–10.5)
Creatinine, Ser: 1 mg/dL (ref 0.50–1.10)
GFR calc Af Amer: 57 mL/min — ABNORMAL LOW (ref >90)
GFR calc non Af Amer: 49 mL/min — ABNORMAL LOW (ref >90)
Glucose, Bld: 82 mg/dL (ref 70–99)
POTASSIUM: 3.9 mmol/L (ref 3.5–5.1)
Sodium: 138 mmol/L (ref 135–145)
Total Protein: 5.8 g/dL — ABNORMAL LOW (ref 6.0–8.3)

## 2014-04-23 LAB — CBC
HCT: 31.7 % — ABNORMAL LOW (ref 36.0–46.0)
Hemoglobin: 10.4 g/dL — ABNORMAL LOW (ref 12.0–15.0)
MCH: 30.1 pg (ref 26.0–34.0)
MCHC: 32.8 g/dL (ref 30.0–36.0)
MCV: 91.6 fL (ref 78.0–100.0)
Platelets: 146 10*3/uL — ABNORMAL LOW (ref 150–400)
RBC: 3.46 MIL/uL — AB (ref 3.87–5.11)
RDW: 14.8 % (ref 11.5–15.5)
WBC: 5.5 10*3/uL (ref 4.0–10.5)

## 2014-04-23 LAB — TROPONIN I: Troponin I: <0.03 ng/mL (ref <0.031)

## 2014-04-23 MED ORDER — METHOCARBAMOL 500 MG PO TABS: 500.0000 mg | ORAL_TABLET | Freq: Three times a day (TID) | ORAL | Status: DC | PRN

## 2014-04-23 MED ORDER — TRAMADOL HCL 50 MG PO TABS
100.0000 mg | ORAL_TABLET | Freq: Once | ORAL | Status: DC
  Filled 2014-04-23: qty 2

## 2014-04-23 MED ORDER — UNABLE TO FIND: Status: DC

## 2014-04-23 MED ORDER — OXYCODONE HCL 5 MG PO TABS: 5.0000 mg | ORAL_TABLET | Freq: Four times a day (QID) | ORAL | Status: DC | PRN

## 2014-04-23 NOTE — Progress Notes (Signed)
Patient discharged home with husband, discharge instructions given and explained to patient/husband and they verbalized understanding, denies any pain/distress, accompanied home by husband. No wound noted, skin intact.

## 2014-04-23 NOTE — Evaluation (Signed)
Physical Therapy One Time Evaluation Patient Details Name: Robin Atkins MRN: [id number] DOB: [birth year] Today's Date: 04/23/2014   History of Present Illness  81 y.o. female with a past medical history of sick sinus syndrome status post pacemaker placement, L5 nerve impingement, hypothyroidism, admitted with left arm pain (left elbow up to the shoulder).    Clinical Impression  Patient evaluated by Physical Therapy with no further acute PT needs identified. All education has been completed and the patient has no further questions.  See below for any follow-up Physial Therapy or equipment needs. PT is signing off. Thank you for this referral.  Pt reports she will see orthopaedic MD after d/c and then decide on outpatient PT.     Follow Up Recommendations Outpatient PT    Equipment Recommendations  None recommended by PT    Recommendations for Other Services       Precautions / Restrictions Precautions Precautions: None      Mobility  Bed Mobility Overal bed mobility: Modified Independent                Transfers Overall transfer level: Modified independent                  Ambulation/Gait Ambulation/Gait assistance: Supervision Ambulation Distance (Feet): 120 Feet Assistive device: Straight cane Gait Pattern/deviations: Step-through pattern Gait velocity: decr   General Gait Details: adjusted pt's cane from home to better height, pt reports no dizziness, does c/o moderate L shoulder pain due to use of cane on L side  Stairs            Wheelchair Mobility    Modified Rankin (Stroke Patients Only)       Balance                                             Pertinent Vitals/Pain Pain Assessment: 0-10 Pain Score: 8  Pain Location: L upper posterior shoulder Pain Descriptors / Indicators: Sore;Tender Pain Intervention(s): Limited activity within patient's tolerance;Monitored during session;Heat applied    Home Living  Family/patient expects to be discharged to:: Private residence Living Arrangements: Spouse/significant other   Type of Home: House       Home Layout: Able to live on main level with bedroom/bathroom Home Equipment: Cane - single point      Prior Function Level of Independence: Independent with assistive device(s)         Comments: reports using cane for mobility after R femur fxs a few years ago     Hand Dominance        Extremity/Trunk Assessment   Upper Extremity Assessment: LUE deficits/detail       LUE Deficits / Details: tender to palpation over upper posterior shoulder (supraspinatus, levator scapulae, upper trapezius area), pain with shoulder elevation to that area, grossly 4/5 shoulder IR and ER as well as elbow extension and flexion, L scapula elevated compared to Right   Lower Extremity Assessment: Overall WFL for tasks assessed      Cervical / Trunk Assessment: Other exceptions  Communication   Communication: No difficulties  Cognition Arousal/Alertness: Awake/alert Behavior During Therapy: WFL for tasks assessed/performed Overall Cognitive Status: Within Functional Limits for tasks assessed                      General Comments      Exercises  Assessment/Plan    PT Assessment All further PT needs can be met in the next venue of care  PT Diagnosis Acute pain   PT Problem List Decreased strength;Pain  PT Treatment Interventions     PT Goals (Current goals can be found in the Care Plan section) Acute Rehab PT Goals PT Goal Formulation: All assessment and education complete, DC therapy    Frequency     Barriers to discharge        Co-evaluation               End of Session   Activity Tolerance: Patient tolerated treatment well Patient left: in bed;with call bell/phone within reach;with family/visitor present Nurse Communication: Mobility status    Functional Assessment Tool Used: clinical judgement Functional  Limitation: Mobility: Walking and moving around Mobility: Walking and Moving Around Current Status (J1791): At least 1 percent but less than 20 percent impaired, limited or restricted Mobility: Walking and Moving Around Goal Status [phone]): At least 1 percent but less than 20 percent impaired, limited or restricted Mobility: Walking and Moving Around Discharge Status [phone]): At least 1 percent but less than 20 percent impaired, limited or restricted    Time: 0905-0919 PT Time Calculation (min) (ACUTE ONLY): 14 min   Charges:   PT Evaluation $Initial PT Evaluation Tier I: 1 Procedure     PT G Codes:   PT G-Codes **NOT FOR INPATIENT CLASS** Functional Assessment Tool Used: clinical judgement Functional Limitation: Mobility: Walking and moving around Mobility: Walking and Moving Around Current Status ([id number]): At least 1 percent but less than 20 percent impaired, limited or restricted Mobility: Walking and Moving Around Goal Status [phone]): At least 1 percent but less than 20 percent impaired, limited or restricted Mobility: Walking and Moving Around Discharge Status [phone]): At least 1 percent but less than 20 percent impaired, limited or restricted    Izayiah Tibbitts,KATHrine E 04/23/2014, 10:51 AM Carmelia Bake, PT, DPT 04/23/2014 Pager: [phone]

## 2014-04-23 NOTE — Progress Notes (Signed)
UR completed 

## 2014-04-23 NOTE — Discharge Summary (Signed)
Physician Discharge Summary   Patient ID: Robin Atkins MRN: [id number] DOB/AGE: 10-12-1924 81 y.o.  Admit date: 04/22/2014 Discharge date: 04/23/2014  Primary Care Physician:  Ezequiel Kayser, MD  Discharge Diagnoses:    . atypical mild transient Chest pain resolved  . Left upper arm pain likely due to glenohumeral osteoarthritis  . SICK SINUS SYNDROME . Hypothyroidism  Consults:  None   Recommendations for Outpatient Follow-up:  Patient was recommended to follow up with her orthopedics, Dr. Dorthula Nettles, may need steroid/lidocaine injection in the shoulder    DIET: Heart healthy diet    Allergies:  No Known Allergies   Discharge Medications:   Medication List    TAKE these medications        aspirin EC 81 MG tablet  Take 81 mg by mouth daily.     cholecalciferol 1000 UNITS tablet  Commonly known as:  VITAMIN D  Take 1,000 Units by mouth daily.     diclofenac 75 MG EC tablet  Commonly known as:  VOLTAREN  Take 75 mg by mouth 2 (two) times daily as needed for mild pain.     fish oil-omega-3 fatty acids 1000 MG capsule  Take 1 g by mouth daily.     FORTEO 600 MCG/2.4ML Soln  Generic drug:  Teriparatide (Recombinant)  Inject 20 mcg into the skin daily after breakfast.     levothyroxine 50 MCG tablet  Commonly known as:  SYNTHROID, LEVOTHROID  Take 1 tablet by mouth daily.     methocarbamol 500 MG tablet  Commonly known as:  ROBAXIN  Take 1 tablet (500 mg total) by mouth every 8 (eight) hours as needed for muscle spasms.     oxyCODONE 5 MG immediate release tablet  Commonly known as:  Oxy IR/ROXICODONE  Take 1 tablet (5 mg total) by mouth every 6 (six) hours as needed for severe pain.     pravastatin 20 MG tablet  Commonly known as:  PRAVACHOL  Take 20 mg by mouth at bedtime.     traMADol 50 MG tablet  Commonly known as:  ULTRAM  Take 1 tablet by mouth every 4 (four) hours as needed for moderate pain.     UNABLE TO FIND  - OUTPATIENT PHYSICAL  THERAPY  -   -   - Diagnosis: Left shoulder glenohumeral osteoarthritis         Brief H and P: For complete details please refer to admission H and P, but in brief Robin Atkins is a 81 y.o. female with a past medical history of sick sinus syndrome status post pacemaker placement, L5 nerve impingement, hypothyroidism, who was in her usual state of health until last night when she developed pain in the left arm. The pain was from the left elbow up to the shoulder. She had pain all night long. She had a couple of episodes of fleeting chest pains as well, but that didn't last more than a few minutes. Lying down, apparently increases this pain. She called her primary care physician this morning and was asked to come into the emergency department. Denies any shortness of breath, cough, fever. No palpitations. Reported one episode of fleeting lightheadedness, but no syncopal episodes. Denied any chest pain or shortness of breath currently. She's never had similar symptoms before. Denied any history of trauma or falls. Patient did report that she took a road trip to [place] a few days ago. She did take a lot of breaks along the way.  Hospital Course:  Atypical transient  Chest pain with left arm pain: Likely due to musculoskeletal etiology. Patient denied any symptoms before and denied any history of falls or trauma. D-dimer was checked due to her history of recent road trip and was elevated at 0.72. CT angiogram of the chest was done which showed no pulmonary embolism. Troponins remained negative, EKG did not show any acute changes of ST T waves suggestive of ischemia. The patient had significant improvement with one dose of oxycodone.  Left upper arm/shoulder pain - Left shoulder x-ray showed glenohumeral osteoarthritis, her pain did improve with one dose of oxycodone. I placed the patient on low-dose oxycodone with the Robaxin. PT evaluation was done and recommended outpatient PT, patient was  given the prescription for physical therapy. Appointment was made with Dr. Dorthula NettlesJosh Landau on 4/11 for evaluation.  CT C-spine showed moderate multilevel cervical spondylosis and facet arthrosis, left-sided degenerative disease C6-7.    SICK SINUS SYNDROME: Stable heart rate normal    Hypothyroidism continue levothyroxine  Incidental finding of pleural effusion: Very small on the right, does not need any further workup in the hospital, please monitor outpatient    Day of Discharge BP 158/65 mmHg  Pulse 74  Temp(Src) 98.3 F (36.8 C) (Oral)  Resp 20  Ht 5\' 5"  (1.651 m)  Wt 54.7 kg (120 lb 9.5 oz)  BMI 20.07 kg/m2  SpO2 100%  Physical Exam: General: Alert and awake oriented x3 not in any acute distress. HEENT: anicteric sclera, pupils reactive to light and accommodation CVS: S1-S2 clear no murmur rubs or gallops Chest: clear to auscultation bilaterally, no wheezing rales or rhonchi Abdomen: soft nontender, nondistended, normal bowel sounds Extremities: no cyanosis, clubbing or edema noted bilaterally, Some limitation in the range of motion with the left shoulder and arm  Neuro: Cranial nerves II-XII intact, no focal neurological deficits   The results of significant diagnostics from this hospitalization (including imaging, microbiology, ancillary and laboratory) are listed below for reference.    LAB RESULTS: Basic Metabolic Panel:  Recent Labs Lab 04/22/14 1050 04/23/14 0350  NA 137 138  K 4.4 3.9  CL 105 105  CO2 24 25  GLUCOSE 79 82  BUN 17 19  CREATININE 0.74 1.00  CALCIUM 9.7 8.9   Liver Function Tests:  Recent Labs Lab 04/23/14 0350  AST 16  ALT 13  ALKPHOS 87  BILITOT 0.5  PROT 5.8*  ALBUMIN 3.5   No results for input(s): LIPASE, AMYLASE in the last 168 hours. No results for input(s): AMMONIA in the last 168 hours. CBC:  Recent Labs Lab 04/22/14 1050 04/23/14 0350  WBC 6.3 5.5  HGB 11.0* 10.4*  HCT 34.4* 31.7*  MCV 92.7 91.6  PLT 167 146*    Cardiac Enzymes:  Recent Labs Lab 04/22/14 2125 04/23/14 0350  TROPONINI <0.03 <0.03   BNP: Invalid input(s): POCBNP CBG: No results for input(s): GLUCAP in the last 168 hours.  Significant Diagnostic Studies:  Dg Chest 2 View  04/22/2014   CLINICAL DATA:  LEFT arm pain since last night. Abnormal sensation in the chest. Initial encounter.  EXAM: CHEST  2 VIEW  COMPARISON:  04/28/2012.  FINDINGS: Hyperinflation is present compatible with emphysema. Blunting of the RIGHT costophrenic angle compatible with a small RIGHT pleural effusion. The cardiopericardial silhouette is within normal limits. Aortic arch atherosclerosis. RIGHT subclavian cardiac pacemaker with fractured lead inferior to the RIGHT clavicle. This fractured lead is unchanged compared to prior exam. Small fragment of the lead is present adjacent  to the fracture site. This appears to be the old RIGHT atrial appendage lead.  Monitoring leads project over the chest.  No airspace disease.  On the lateral view, there is increased density over the lower thoracic spine, favored to represent atelectasis based on the frontal views. Airspace disease is considered unlikely.  IMPRESSION: 1. Tiny RIGHT pleural effusion with blunting of the RIGHT costophrenic angle on the frontal view. 2. Increased lower lobe density on the lateral view is favored to represent atelectasis based on the frontal projections. Pneumonia considered unlikely. 3. Atherosclerosis. 4. Unchanged pacemaker.   Electronically Signed   By: Andreas Newport M.D.   On: 04/22/2014 12:27   Ct Angio Chest Pe W/cm &/or Wo Cm  04/22/2014   CLINICAL DATA:  Left-sided chest pain for 2 days.  EXAM: CT ANGIOGRAPHY CHEST WITH CONTRAST  TECHNIQUE: Multidetector CT imaging of the chest was performed using the standard protocol during bolus administration of intravenous contrast. Multiplanar CT image reconstructions and MIPs were obtained to evaluate the vascular anatomy.  CONTRAST:   OMNIPAQUE IOHEXOL 350 MG/ML SOLN  COMPARISON:  None.  FINDINGS: Chest wall: No breast masses, supraclavicular or axillary lymphadenopathy. There is a permanent right-sided pacemaker in place and associated artifact. The bony thorax is intact. No destructive bone lesions or spinal canal compromise. Moderate scoliosis. Remote T3 compression fracture.  Mediastinum: The heart is normal in size. No pericardial effusion. There is mild tortuosity, ectasia and calcification of the thoracic aorta but no focal aneurysm or dissection. Aberrant right subclavian artery is demonstrated with mild mass effect on the esophagus. No mediastinal or hilar mass or adenopathy. The esophagus is grossly normal.  The pulmonary arterial tree is well opacified. No filling defects to suggest pulmonary emboli.  Lungs/ pleura: Biapical pleural and parenchymal scarring changes are noted. No pulmonary edema, pleural effusion or focal pulmonary infiltrates. Dependent bibasilar atelectasis is noted. There is a 6 mm right upper lobe pulmonary nodule on image 15. This was also present and is unchanged since a thoracic spine CT scan from 2013 no other pulmonary nodules.  Upper abdomen:  No significant findings.  Review of the MIP images confirms the above findings.  IMPRESSION: 1. No CT findings for pulmonary embolism. The pulmonary arteries are slightly prominent suggesting pulmonary hypertension. 2. Mild tortuosity, ectasia and calcification of the thoracic aorta but no focal aneurysm or dissection. Aberrant right subclavian artery is noted incidentally. 3. No acute pulmonary findings. Stable 6 mm right upper lobe pulmonary nodule since 2013.   Electronically Signed   By: Rudie Meyer M.D.   On: 04/22/2014 16:56   Ct Cervical Spine Wo Contrast  04/22/2014   CLINICAL DATA:  LEFT arm pain with onset of symptoms last night. Posterior cervicalgia/neck pain. LEFT upper extremity radiculopathy. No injury. Cervical collar present.  EXAM: CT CERVICAL SPINE  WITHOUT CONTRAST  TECHNIQUE: Multidetector CT imaging of the cervical spine was performed without intravenous contrast. Multiplanar CT image reconstructions were also generated.  COMPARISON:  Thoracic spine CT 10/23/2011.  FINDINGS: Alignment: Trace anterolisthesis of C4 on C5 appears degenerative and facet mediated. There is a thoracic kyphosis at T2-T3 associated with the chronic T3 compression fracture which was evident on prior CT 10/23/2011. Trace anterolisthesis of C6 on C7 appears degenerative and facet mediated.  Craniocervical junction: Atlantodental degenerative disease. The odontoid is intact. Occipital condyles appear intact.  Vertebrae: No aggressive osseous lesions. No cervical spine fracture.  Paraspinal soft tissues: Aortic branch vessel atherosclerosis. Carotid atherosclerosis.  Lung apices:  Unchanged benign 6 mm RIGHT upper lobe pulmonary nodule. This appears identical to the prior exam 10/23/2011. Bilateral pleural apical scarring.  C2-C3:  Negative.  C3-C4: RIGHT-greater-than- LEFT facet arthrosis. Foramina appear adequately patent.  C4-C5: No bony central stenosis. RIGHT foraminal stenosis due to RIGHT facet arthrosis. LEFT foramen appears patent.  C5-C6: Shallow disc osteophyte complex. Central canal patent. The central canal is capacious. Right-greater-than-left foraminal stenosis associated with facet arthrosis and uncovertebral spurring.  C6-C7: Left-greater-than-right facet arthrosis. Anterior facet spurring mildly encroaches on the RIGHT neural foramen. Subchondral cysts present on both sides of the LEFT facet joint.  C7-T1:  No bony stenosis.  IMPRESSION: 1. Moderate multilevel cervical spondylosis and facet arthrosis. 2. In this patient with LEFT upper extremity radicular symptoms, left-sided degenerative disease is most pronounced at C6-C7 due to facet arthrosis. 3. Chronic T3 compression fracture.   Electronically Signed   By: Andreas Newport M.D.   On: 04/22/2014 14:22   Dg Shoulder  Left  04/22/2014   CLINICAL DATA:  Left shoulder pain.  EXAM: LEFT SHOULDER - 2+ VIEW  COMPARISON:  None.  FINDINGS: No acute bony or joint abnormality is identified. Advanced glenohumeral osteoarthritis is seen. There is only mild acromioclavicular degenerative change. Imaged left lung and ribs appear clear.  IMPRESSION: No acute abnormality.  Advanced glenohumeral osteoarthritis.   Electronically Signed   By: Drusilla Kanner M.D.   On: 04/22/2014 15:06    2D ECHO:   Disposition and Follow-up: Discharge Instructions    Diet - low sodium heart healthy    Complete by:  As directed      Increase activity slowly    Complete by:  As directed             DISPOSITION: Home with outpatient physical therapy  DISCHARGE FOLLOW-UP Follow-up Information    Follow up with Eulas Post, MD On 04/26/2014.   Specialty:  Orthopedic Surgery   Why:  at 10AM arrive at 945am.   Contact information:   [address] [place] [address] [phone]       Follow up with PERINI,MARK A, MD. Schedule an appointment as soon as possible for a visit in 10 days.   Specialty:  Internal Medicine   Why:  for hospital follow-up   Contact information:   [address] [place] [address] [phone]        Time spent on Discharge: 35 mins   Signed:   Maurilio Puryear M.D. [facility] 04/23/2014, 11:19 AM Pager: [phone]

## 2014-05-04 ENCOUNTER — Other Ambulatory Visit: Payer: Self-pay | Admitting: Neurosurgery

## 2014-05-04 DIAGNOSIS — M5416 Radiculopathy, lumbar region: Secondary | ICD-10-CM

## 2014-05-06 ENCOUNTER — Telehealth: Payer: Self-pay | Admitting: Internal Medicine

## 2014-05-06 NOTE — Telephone Encounter (Signed)
#  1 chest pain with left arm pain: As mentioned above this is most likely musculoskeletal in etiology. Although patient mentions she's never had this symptom before and denies any history of falls or trauma. Due to the history of her road trip we will proceed with d-dimer. If d-dimer is positive CT angio will be ordered. She'll be observed in the hospital overnight. Serial troponins. Physical therapy in the morning. Anti-inflammatories.  She is having the same pain as above and has canceled a procedure she was to have tomorrow with Dr Jule SerNudleman as she does not feel she can lay flat for 24 hours after.  I have asked her to call his office and speak with nurse with her concerns.  She says the pain is worse when she is laying down and has relief when she is up moving about

## 2014-05-06 NOTE — Telephone Encounter (Signed)
New message      Patient c/o last Monday she had arm/shoulder pain.  April 7-8 pt was seen in the ER for chest./arm/shoulder pain.  They did a lot of test and determined it was not heart.  She saw an orthopedic and got a cortisone shot and a presc for muscle relaxant.  Now she is not sure if it is muscle pain again.  She request to talk to a nurse to see if it is possible heart related.

## 2014-05-07 ENCOUNTER — Other Ambulatory Visit: Payer: Medicare Other

## 2014-05-21 ENCOUNTER — Other Ambulatory Visit: Payer: Self-pay | Admitting: Neurosurgery

## 2014-05-21 DIAGNOSIS — M542 Cervicalgia: Secondary | ICD-10-CM

## 2014-05-21 DIAGNOSIS — M5416 Radiculopathy, lumbar region: Secondary | ICD-10-CM

## 2014-05-31 ENCOUNTER — Ambulatory Visit
Admission: RE | Admit: 2014-05-31 | Discharge: 2014-05-31 | Disposition: A | Discharge status: Home / Self Care | Payer: Medicare Other | Source: Ambulatory Visit | Attending: Neurosurgery | Admitting: Neurosurgery

## 2014-05-31 ENCOUNTER — Other Ambulatory Visit: Payer: Medicare Other

## 2014-05-31 VITALS — BP 173/73 | HR 71

## 2014-05-31 DIAGNOSIS — M542 Cervicalgia: Secondary | ICD-10-CM

## 2014-05-31 DIAGNOSIS — M79622 Pain in left upper arm: Secondary | ICD-10-CM

## 2014-05-31 DIAGNOSIS — M5416 Radiculopathy, lumbar region: Secondary | ICD-10-CM

## 2014-05-31 MED ORDER — IOHEXOL 300 MG/ML  SOLN
10.0000 mL | Freq: Once | INTRAMUSCULAR | Status: AC | PRN
  Administered 2014-05-31: 10 mL via INTRATHECAL

## 2014-05-31 MED ORDER — MEPERIDINE HCL 100 MG/ML IJ SOLN
75.0000 mg | Freq: Once | INTRAMUSCULAR | Status: AC
  Administered 2014-05-31: 50 mg via INTRAMUSCULAR

## 2014-05-31 MED ORDER — ONDANSETRON HCL 4 MG/2ML IJ SOLN
4.0000 mg | Freq: Once | INTRAMUSCULAR | Status: AC
  Administered 2014-05-31: 4 mg via INTRAMUSCULAR

## 2014-05-31 NOTE — Discharge Instructions (Signed)
Myelogram Discharge Instructions  1. Go home and rest quietly for the next 24 hours.  It is important to lie flat for the next 24 hours.  Get up only to go to the restroom.  You may lie in the bed or on a couch on your back, your stomach, your left side or your right side.  You may have one pillow under your head.  You may have pillows between your knees while you are on your side or under your knees while you are on your back.  2. DO NOT drive today.  Recline the seat as far back as it will go, while still wearing your seat belt, on the way home.  3. You may get up to go to the bathroom as needed.  You may sit up for 10 minutes to eat.  You may resume your normal diet and medications unless otherwise indicated.  Drink lots of extra fluids today and tomorrow.  4. The incidence of headache, nausea, or vomiting is about 5% (one in 20 patients).  If you develop a headache, lie flat and drink plenty of fluids until the headache goes away.  Caffeinated beverages may be helpful.  If you develop severe nausea and vomiting or a headache that does not go away with flat bed rest, call [phone].  5. You may resume normal activities after your 24 hours of bed rest is over; however, do not exert yourself strongly or do any heavy lifting tomorrow. If when you get up you have a headache when standing, go back to bed and force fluids for another 24 hours.  6. Call your physician for a follow-up appointment.  The results of your myelogram will be sent directly to your physician by the following day.  7. If you have any questions or if complications develop after you arrive home, please call [phone].  Discharge instructions have been explained to the patient.  The patient, or the person responsible for the patient, fully understands these instructions.       May resume Tramadol on Jun 01, 2014, after 11:00 am.

## 2014-05-31 NOTE — Progress Notes (Signed)
Patient states she has been off Tramadol for the past two days.   

## 2014-06-07 ENCOUNTER — Encounter: Payer: Self-pay | Admitting: Internal Medicine

## 2014-06-07 ENCOUNTER — Other Ambulatory Visit: Payer: Self-pay

## 2014-06-07 ENCOUNTER — Ambulatory Visit (INDEPENDENT_AMBULATORY_CARE_PROVIDER_SITE_OTHER): Payer: Medicare Other | Admitting: Internal Medicine

## 2014-06-07 VITALS — BP 158/80 | HR 82 | Ht 64.0 in | Wt 122.0 lb

## 2014-06-07 DIAGNOSIS — E785 Hyperlipidemia, unspecified: Secondary | ICD-10-CM | POA: Diagnosis not present

## 2014-06-07 DIAGNOSIS — I495 Sick sinus syndrome: Secondary | ICD-10-CM | POA: Diagnosis not present

## 2014-06-07 LAB — CUP PACEART INCLINIC DEVICE CHECK
Battery Remaining Longevity: 145 mo
Brady Statistic AS VS Percent: 0 %
Date Time Interrogation Session: 20160523165044
Lead Channel Impedance Value: 649 Ohm
Lead Channel Pacing Threshold Pulse Width: 0.4 ms
Lead Channel Sensing Intrinsic Amplitude: 5.6 mV
Lead Channel Setting Pacing Amplitude: 2.5 V
Lead Channel Setting Pacing Pulse Width: 0.4 ms
Lead Channel Setting Sensing Sensitivity: 2 mV
MDC IDC MSMT BATTERY IMPEDANCE: 111 Ohm
MDC IDC MSMT BATTERY VOLTAGE: 2.8 V
MDC IDC MSMT LEADCHNL RA PACING THRESHOLD AMPLITUDE: 0.75 V
MDC IDC MSMT LEADCHNL RV IMPEDANCE VALUE: 364 Ohm
MDC IDC MSMT LEADCHNL RV PACING THRESHOLD AMPLITUDE: 0.75 V
MDC IDC MSMT LEADCHNL RV PACING THRESHOLD PULSEWIDTH: 0.4 ms
MDC IDC SET LEADCHNL RA PACING AMPLITUDE: 2 V
MDC IDC STAT BRADY AP VP PERCENT: 0 %
MDC IDC STAT BRADY AP VS PERCENT: 100 %
MDC IDC STAT BRADY AS VP PERCENT: 0 %

## 2014-06-07 NOTE — Progress Notes (Signed)
PCP: Ezequiel Kayser, MD  Robin Atkins is a 81 y.o. female who presents today for routine electrophysiology followup.  Since her last visit, the patient reports doing very well.  She denies procedure related complications.  She remains very active without any ischemic symptoms.  Today, she denies symptoms of palpitations, chest pain, shortness of breath,  lower extremity edema, dizziness, presyncope, or syncope.  The patient is otherwise without complaint today.   Past Medical History  Diagnosis Date  . MVP (mitral valve prolapse)   . Hyperlipidemia   . OP (osteoporosis)   . SSS (sick sinus syndrome)     s/p PTVP  . Hypothyroidism   . Broken foot     Broken right foot and broken left knee in 2011  . S/P cardiac pacemaker procedure     Original implant 1998 with generator change in 2005. She has had prior lead revision.   . Knee cap dislocation 2011    left  . Hx of migraine headaches   . Benign recurrent vertigo   . Diverticulosis   . Shingles   . GERD (gastroesophageal reflux disease)   . Wrist fracture     lt  . Cataracts, bilateral   . Hemorrhoids   . DDD (degenerative disc disease), lumbar   . Lumbar radiculopathy    Past Surgical History  Procedure Laterality Date  . Total hip arthroplasty      right  . Pacemaker insertion  1998/2005    TRANSVENOUS PACEMAKER  . US echocardiography  11/25/2008    EF 55-60%; mild to moderate AI  . Cardiovascular stress test  07/10/2005    EF 78%, NO ISCHEMIA  . Hemorroidectomy    . Orif femur fracture Right 04/29/2012    Procedure: OPEN REDUCTION INTERNAL FIXATION (ORIF) DISTAL FEMUR FRACTURE;  Surgeon: Eulas Post, MD;  Location: [facility] OR;  Service: Orthopedics;  Laterality: Right;  biomet long condylar plate, flat jackson, cerclage wires, big c arm  . Pacemaker generator change  02/09/13    MDT Adapta L gen change by Dr [name]  . Pacemaker generator change N/A 02/09/2013    Procedure: PACEMAKER GENERATOR CHANGE;  Surgeon: Gardiner Rhyme, MD;  Location: [facility] CATH LAB;  Service: Cardiovascular;  Laterality: N/A;    Current Outpatient Prescriptions  Medication Sig Dispense Refill  . aspirin EC 81 MG tablet Take 81 mg by mouth daily.    . cholecalciferol (VITAMIN D) 1000 UNITS tablet Take 1,000 Units by mouth daily.      . fish oil-omega-3 fatty acids 1000 MG capsule Take 1 g by mouth daily.     Marland Kitchen levothyroxine (SYNTHROID, LEVOTHROID) 50 MCG tablet Take 1 tablet by mouth daily.  1  . methocarbamol (ROBAXIN) 500 MG tablet Take 1 tablet (500 mg total) by mouth every 8 (eight) hours as needed for muscle spasms. 60 tablet 0  . oxyCODONE (OXY IR/ROXICODONE) 5 MG immediate release tablet Take 1 tablet (5 mg total) by mouth every 6 (six) hours as needed for severe pain. 30 tablet 0  . pravastatin (PRAVACHOL) 20 MG tablet Take 20 mg by mouth at bedtime.     . Teriparatide, Recombinant, (FORTEO) 600 MCG/2.4ML SOLN Inject 20 mcg into the skin at bedtime.     . traMADol (ULTRAM) 50 MG tablet Take 1 tablet by mouth every 4 (four) hours as needed for moderate pain.   0  . UNABLE TO FIND OUTPATIENT PHYSICAL THERAPY   Diagnosis: Left shoulder glenohumeral osteoarthritis 1 Mutually Defined  0   No current facility-administered medications for this visit.    Physical Exam: Filed Vitals:   06/07/14 1348  BP: 158/80  Pulse: 82  Height: 5\' 4"  (1.626 m)  Weight: 55.339 kg (122 lb)    GEN- The patient is well appearing, alert and oriented x 3 today.   Head- normocephalic, atraumatic Eyes-  Sclera clear, conjunctiva pink Ears- hearing intact Oropharynx- clear Lungs- Clear to ausculation bilaterally, normal work of breathing Chest- pacemaker pocket is well healed Heart- Regular rate and rhythm, no murmurs, rubs or gallops, PMI not laterally displaced GI- soft, NT, ND, + BS Extremities- no clubbing, cyanosis, or edema  Pacemaker interrogation- reviewed in detail today,  See PACEART report  Assessment and Plan:  1. Sick sinus  syndrome Normal pacemaker function See Pace Art report No changes today  2. HL Stable No change required today  3. HTN Repeat by MD is 130/70 She reports good BP control at home No changes today  carelink Return to see me in 1 year

## 2014-06-07 NOTE — Patient Instructions (Signed)
Medication Instructions:  Your physician recommends that you continue on your current medications as directed. Please refer to the Current Medication list given to you today.   Labwork: None ordered  Testing/Procedures: None ordered  Follow-Up: Your physician wants you to follow-up in: 12 months with Dr Allred You will receive a reminder letter in the mail two months in advance. If you don't receive a letter, please call our office to schedule the follow-up appointment.  Remote monitoring is used to monitor your Pacemaker or ICD from home. This monitoring reduces the number of office visits required to check your device to one time per year. It allows us to keep an eye on the functioning of your device to ensure it is working properly. You are scheduled for a device check from home on 09/06/14. You may send your transmission at any time that day. If you have a wireless device, the transmission will be sent automatically. After your physician reviews your transmission, you will receive a postcard with your next transmission date.    Any Other Special Instructions Will Be Listed Below (If Applicable).   

## 2014-09-06 ENCOUNTER — Ambulatory Visit (INDEPENDENT_AMBULATORY_CARE_PROVIDER_SITE_OTHER): Payer: Medicare Other | Admitting: *Deleted

## 2014-09-06 ENCOUNTER — Telehealth: Payer: Self-pay | Admitting: Cardiology

## 2014-09-06 DIAGNOSIS — I495 Sick sinus syndrome: Secondary | ICD-10-CM

## 2014-09-06 NOTE — Telephone Encounter (Signed)
LMOVM reminding pt to send remote transmission.   

## 2014-09-07 NOTE — Progress Notes (Signed)
Remote pacemaker transmission.   

## 2014-09-10 LAB — CUP PACEART REMOTE DEVICE CHECK
Battery Remaining Longevity: 144 mo
Battery Voltage: 2.8 V
Brady Statistic AS VP Percent: 0 %
Brady Statistic AS VS Percent: 0 %
Date Time Interrogation Session: 20160822213341
Lead Channel Impedance Value: 358 Ohm
Lead Channel Pacing Threshold Amplitude: 0.75 V
Lead Channel Pacing Threshold Pulse Width: 0.4 ms
Lead Channel Setting Pacing Amplitude: 2.5 V
Lead Channel Setting Sensing Sensitivity: 2 mV
MDC IDC MSMT BATTERY IMPEDANCE: 111 Ohm
MDC IDC MSMT LEADCHNL RA IMPEDANCE VALUE: 648 Ohm
MDC IDC MSMT LEADCHNL RA PACING THRESHOLD AMPLITUDE: 0.875 V
MDC IDC MSMT LEADCHNL RA PACING THRESHOLD PULSEWIDTH: 0.4 ms
MDC IDC MSMT LEADCHNL RV SENSING INTR AMPL: 5.6 mV
MDC IDC SET LEADCHNL RA PACING AMPLITUDE: 2 V
MDC IDC SET LEADCHNL RV PACING PULSEWIDTH: 0.4 ms
MDC IDC STAT BRADY AP VP PERCENT: 0 %
MDC IDC STAT BRADY AP VS PERCENT: 100 %

## 2014-09-17 ENCOUNTER — Encounter: Payer: Self-pay | Admitting: Cardiology

## 2014-09-21 ENCOUNTER — Other Ambulatory Visit: Payer: Self-pay | Admitting: Radiology

## 2014-10-19 ENCOUNTER — Encounter: Payer: Self-pay | Admitting: Internal Medicine

## 2014-11-08 DIAGNOSIS — Z0000 Encounter for general adult medical examination without abnormal findings: Secondary | ICD-10-CM | POA: Insufficient documentation

## 2014-11-15 DIAGNOSIS — D649 Anemia, unspecified: Secondary | ICD-10-CM | POA: Insufficient documentation

## 2014-11-16 ENCOUNTER — Emergency Department (HOSPITAL_COMMUNITY): Payer: Medicare Other

## 2014-11-16 ENCOUNTER — Emergency Department (HOSPITAL_COMMUNITY)
Admission: EM | Admit: 2014-11-16 | Discharge: 2014-11-16 | Disposition: A | Discharge status: Home / Self Care | Payer: Medicare Other | Attending: Emergency Medicine | Admitting: Emergency Medicine

## 2014-11-16 ENCOUNTER — Encounter (HOSPITAL_COMMUNITY): Payer: Self-pay | Admitting: *Deleted

## 2014-11-16 DIAGNOSIS — Z950 Presence of cardiac pacemaker: Secondary | ICD-10-CM | POA: Insufficient documentation

## 2014-11-16 DIAGNOSIS — Z7982 Long term (current) use of aspirin: Secondary | ICD-10-CM | POA: Diagnosis not present

## 2014-11-16 DIAGNOSIS — Z79899 Other long term (current) drug therapy: Secondary | ICD-10-CM | POA: Diagnosis not present

## 2014-11-16 DIAGNOSIS — Z8781 Personal history of (healed) traumatic fracture: Secondary | ICD-10-CM | POA: Insufficient documentation

## 2014-11-16 DIAGNOSIS — E039 Hypothyroidism, unspecified: Secondary | ICD-10-CM | POA: Insufficient documentation

## 2014-11-16 DIAGNOSIS — Z87828 Personal history of other (healed) physical injury and trauma: Secondary | ICD-10-CM | POA: Insufficient documentation

## 2014-11-16 DIAGNOSIS — E785 Hyperlipidemia, unspecified: Secondary | ICD-10-CM | POA: Diagnosis not present

## 2014-11-16 DIAGNOSIS — H269 Unspecified cataract: Secondary | ICD-10-CM | POA: Insufficient documentation

## 2014-11-16 DIAGNOSIS — M810 Age-related osteoporosis without current pathological fracture: Secondary | ICD-10-CM | POA: Diagnosis not present

## 2014-11-16 DIAGNOSIS — Z8619 Personal history of other infectious and parasitic diseases: Secondary | ICD-10-CM | POA: Insufficient documentation

## 2014-11-16 DIAGNOSIS — K219 Gastro-esophageal reflux disease without esophagitis: Secondary | ICD-10-CM | POA: Diagnosis not present

## 2014-11-16 DIAGNOSIS — R079 Chest pain, unspecified: Secondary | ICD-10-CM | POA: Diagnosis present

## 2014-11-16 DIAGNOSIS — Z8679 Personal history of other diseases of the circulatory system: Secondary | ICD-10-CM | POA: Diagnosis not present

## 2014-11-16 LAB — COMPREHENSIVE METABOLIC PANEL
ALK PHOS: 95 U/L (ref 38–126)
ALT: 13 U/L — AB (ref 14–54)
AST: 18 U/L (ref 15–41)
Albumin: 4 g/dL (ref 3.5–5.0)
Anion gap: 7 (ref 5–15)
BILIRUBIN TOTAL: 0.3 mg/dL (ref 0.3–1.2)
BUN: 16 mg/dL (ref 6–20)
CO2: 26 mmol/L (ref 22–32)
CREATININE: 0.7 mg/dL (ref 0.44–1.00)
Calcium: 9.3 mg/dL (ref 8.9–10.3)
Chloride: 104 mmol/L (ref 101–111)
Glucose, Bld: 96 mg/dL (ref 65–99)
Potassium: 4.3 mmol/L (ref 3.5–5.1)
Sodium: 137 mmol/L (ref 135–145)
TOTAL PROTEIN: 6.9 g/dL (ref 6.5–8.1)

## 2014-11-16 LAB — CBC
HCT: 36.1 % (ref 36.0–46.0)
HEMOGLOBIN: 12.1 g/dL (ref 12.0–15.0)
MCH: 29.6 pg (ref 26.0–34.0)
MCHC: 33.5 g/dL (ref 30.0–36.0)
MCV: 88.3 fL (ref 78.0–100.0)
Platelets: 160 10*3/uL (ref 150–400)
RBC: 4.09 MIL/uL (ref 3.87–5.11)
RDW: 14.5 % (ref 11.5–15.5)
WBC: 5.6 10*3/uL (ref 4.0–10.5)

## 2014-11-16 LAB — PROTIME-INR
INR: 1.04 (ref 0.00–1.49)
PROTHROMBIN TIME: 13.8 s (ref 11.6–15.2)

## 2014-11-16 LAB — I-STAT TROPONIN, ED
TROPONIN I, POC: 0 ng/mL (ref 0.00–0.08)
Troponin i, poc: 0 ng/mL (ref 0.00–0.08)

## 2014-11-16 LAB — APTT: aPTT: 34 seconds (ref 24–37)

## 2014-11-16 LAB — LIPASE, BLOOD: Lipase: 28 U/L (ref 11–51)

## 2014-11-16 MED ORDER — MORPHINE SULFATE (PF) 4 MG/ML IV SOLN
4.0000 mg | Freq: Once | INTRAVENOUS | Status: AC
  Administered 2014-11-16: 4 mg via INTRAVENOUS
  Filled 2014-11-16: qty 1

## 2014-11-16 MED ORDER — SODIUM CHLORIDE 0.9 % IV SOLN
1000.0000 mL | INTRAVENOUS | Status: DC
  Administered 2014-11-16: 1000 mL via INTRAVENOUS

## 2014-11-16 MED ORDER — PANTOPRAZOLE SODIUM 40 MG IV SOLR
40.0000 mg | Freq: Once | INTRAVENOUS | Status: AC
  Administered 2014-11-16: 40 mg via INTRAVENOUS
  Filled 2014-11-16: qty 40

## 2014-11-16 MED ORDER — SUCRALFATE 1 G PO TABS: 1.0000 g | ORAL_TABLET | Freq: Three times a day (TID) | ORAL | Status: DC

## 2014-11-16 MED ORDER — TRAMADOL HCL 50 MG PO TABS: 50.0000 mg | ORAL_TABLET | Freq: Every day | ORAL | Status: DC

## 2014-11-16 NOTE — ED Notes (Addendum)
Pt has pacemaker. Pt reports she went to her pcp yesterday for an annual, blood work and everything was good. Pt told pcp that she had complaints of acid reflux.today pt woke up with all over chest/abd pain. Pain 10/10. Denies SOB.

## 2014-11-16 NOTE — ED Provider Notes (Signed)
The patient's laboratory tests are reassuring.CSN: [id number]     Arrival date & time 11/16/14  1033 History   First MD Initiated Contact with Patient 11/16/14 1106     Chief Complaint  Patient presents with  . Chest Pain    HPI Pt has had burning pain in her upper abdomen and chest for  The last week.  It has been coming and going.  She mentioned it to her doctor yesterday when she had her routine physical.  She was prescribed dexilant  and took it this am.  This morning her symptoms were worse.  It is more painful  And still burning.  It hurts now all the way down her abdomen.  No vomiting.  NO fever.  No cough or shortness of breath.  Some pain with breathing. Past Medical History  Diagnosis Date  . MVP (mitral valve prolapse)   . Hyperlipidemia   . OP (osteoporosis)   . SSS (sick sinus syndrome) (HCC)     s/p PTVP  . Hypothyroidism   . Broken foot     Broken right foot and broken left knee in 2011  . S/P cardiac pacemaker procedure     Original implant 1998 with generator change in 2005. She has had prior lead revision.   . Knee cap dislocation 2011    left  . Hx of migraine headaches   . Benign recurrent vertigo   . Diverticulosis   . Shingles   . GERD (gastroesophageal reflux disease)   . Wrist fracture     lt  . Cataracts, bilateral   . Hemorrhoids   . DDD (degenerative disc disease), lumbar   . Lumbar radiculopathy    Past Surgical History  Procedure Laterality Date  . Total hip arthroplasty      right  . Pacemaker insertion  1998/2005    TRANSVENOUS PACEMAKER  . US echocardiography  11/25/2008    EF 55-60%; mild to moderate AI  . Cardiovascular stress test  07/10/2005    EF 78%, NO ISCHEMIA  . Hemorroidectomy    . Orif femur fracture Right 04/29/2012    Procedure: OPEN REDUCTION INTERNAL FIXATION (ORIF) DISTAL FEMUR FRACTURE;  Surgeon: Eulas Post, MD;  Location: [facility] OR;  Service: Orthopedics;  Laterality: Right;  biomet long condylar plate, flat jackson,  cerclage wires, big c arm  . Pacemaker generator change  02/09/13    MDT Adapta L gen change by Dr [name]  . Pacemaker generator change N/A 02/09/2013    Procedure: PACEMAKER GENERATOR CHANGE;  Surgeon: Gardiner Rhyme, MD;  Location: [facility] CATH LAB;  Service: Cardiovascular;  Laterality: N/A;   Family History  Problem Relation Age of Onset  . Heart attack Mother   . Cancer Father     brain tumour  . Lung cancer Brother     x2  . Diabetes Sister     x 2 brothers  . Lymphoma Sister    Social History  Substance Use Topics  . Smoking status: Never Smoker   . Smokeless tobacco: Never Used  . Alcohol Use: No   OB History    No data available     Review of Systems  All other systems reviewed and are negative.     Allergies  Review of patient's allergies indicates no known allergies.  Home Medications   Prior to Admission medications   Medication Sig Start Date End Date Taking? Authorizing Provider  aspirin 325 MG tablet Take 325 mg by mouth every 4 (  four) hours as needed for mild pain, moderate pain, fever or headache.   Yes Historical Provider, MD  aspirin EC 81 MG tablet Take 81 mg by mouth daily.   Yes Historical Provider, MD  calcium carbonate (TUMS EX) 750 MG chewable tablet Chew 1 tablet by mouth as needed for heartburn.   Yes Historical Provider, MD  Cholecalciferol (VITAMIN D) 2000 UNITS tablet Take 2,000 Units by mouth daily.   Yes Historical Provider, MD  dexlansoprazole (DEXILANT) 60 MG capsule Take 60 mg by mouth daily.   Yes Historical Provider, MD  levothyroxine (SYNTHROID, LEVOTHROID) 50 MCG tablet Take 1 tablet by mouth daily. 03/23/14  Yes Historical Provider, MD  omeprazole (PRILOSEC) 20 MG capsule Take 20 mg by mouth daily.   Yes Historical Provider, MD  pravastatin (PRAVACHOL) 20 MG tablet Take 20 mg by mouth at bedtime.    Yes Historical Provider, MD  vitamin B-12 (CYANOCOBALAMIN) 1000 MCG tablet Take 1,000 mcg by mouth daily.   Yes Historical Provider, MD   methocarbamol (ROBAXIN) 500 MG tablet Take 1 tablet (500 mg total) by mouth every 8 (eight) hours as needed for muscle spasms. Patient not taking: Reported on 11/16/2014 04/23/14   Ripudeep Jenna Luo, MD  oxyCODONE (OXY IR/ROXICODONE) 5 MG immediate release tablet Take 1 tablet (5 mg total) by mouth every 6 (six) hours as needed for severe pain. Patient not taking: Reported on 11/16/2014 04/23/14   Ripudeep Jenna Luo, MD  sucralfate (CARAFATE) 1 G tablet Take 1 tablet (1 g total) by mouth 4 (four) times daily -  with meals and at bedtime. 11/16/14   Linwood Dibbles, MD  traMADol (ULTRAM) 50 MG tablet Take 1 tablet (50 mg total) by mouth daily. 11/16/14   Linwood Dibbles, MD  UNABLE TO FIND OUTPATIENT PHYSICAL THERAPY   Diagnosis: Left shoulder glenohumeral osteoarthritis Patient not taking: Reported on 11/16/2014 04/23/14   Ripudeep K Rai, MD   BP 178/68 mmHg  Pulse 58  Temp(Src) 97.8 F (36.6 C) (Oral)  Resp 20  SpO2 100% Physical Exam  Constitutional: No distress.  HENT:  Head: Normocephalic and atraumatic.  Right Ear: External ear normal.  Left Ear: External ear normal.  Eyes: Conjunctivae are normal. Right eye exhibits no discharge. Left eye exhibits no discharge. No scleral icterus.  Neck: Neck supple. No tracheal deviation present.  Cardiovascular: Normal rate, regular rhythm and intact distal pulses.   Pulmonary/Chest: Effort normal and breath sounds normal. No stridor. No respiratory distress. She has no wheezes. She has no rales.  Abdominal: Soft. Bowel sounds are normal. She exhibits no distension. There is tenderness in the right upper quadrant and epigastric area. There is no rigidity, no rebound and no guarding.  Musculoskeletal: She exhibits no edema or tenderness.  Neurological: She is alert. She has normal strength. No cranial nerve deficit (no facial droop, extraocular movements intact, no slurred speech) or sensory deficit. She exhibits normal muscle tone. She displays no seizure activity.  Coordination normal.  Skin: Skin is warm and dry. No rash noted.  Psychiatric: She has a normal mood and affect.  Nursing note and vitals reviewed.   ED Course  Procedures (including critical care time) Labs Review Labs Reviewed  COMPREHENSIVE METABOLIC PANEL - Abnormal; Notable for the following:    ALT 13 (*)    All other components within normal limits  APTT  CBC  PROTIME-INR  LIPASE, BLOOD  URINALYSIS, ROUTINE W REFLEX MICROSCOPIC (NOT AT [facility])  I-STAT TROPOININ, ED  Rosezena Sensor, ED  Imaging Review Kore[place] Abdomen Complete  11/16/2014  CLINICAL DATA:  Abdominal pain EXAM: ULTRASOUND ABDOMEN COMPLETE COMPARISON:  Abdominal ultrasound of September 27, 2008 FINDINGS: Gallbladder: No gallstones or wall thickening visualized. No sonographic Murphy sign noted. Common bile duct: Diameter: The common bile duct is dilated 12.6 mm. It measured 7.5 mm in 2010. There are no abnormal intraluminal echoes observed. Liver: The liver exhibits normal echotexture with no focal mass nor ductal dilation. The surface contour of the liver is normal. IVC: No abnormality visualized. Pancreas: The pancreas was obscured by bowel gas. Spleen: Size and appearance within normal limits. Right Kidney: Length: 10 cm. Echogenicity within normal limits. No mass or hydronephrosis visualized. Left Kidney: Length: 11 cm. Echogenicity within normal limits. No mass or hydronephrosis visualized. Abdominal aorta: Bowel gas obscured thedistal aorta. The proximal aorta measures 2.7 cm and the mid aorta 1.8 cm in AP dimension. Other findings: No ascites is demonstrated. IMPRESSION: 1. The gallbladder is adequately distended and exhibits no evidence of stones or cholecystitis. There has been further increase in dilation of the common bile duct that is of uncertain etiology. The pancreas was obscured on today's study. Pancreatic protocol MRI or CT scanning is recommended. MRCP may be useful as well. 2. The liver and spleen and  kidneys are unremarkable. Electronically Signed   By: David  SwazilandJordan M.D.   On: 11/16/2014 15:08   Dg Chest Portable 1 View  11/16/2014  CLINICAL DATA:  Chest pain. EXAM: PORTABLE CHEST 1 VIEW COMPARISON:  04/22/2014 FINDINGS: Heart size and pulmonary vascularity are normal and the lungs are clear. Pacemaker in place. Old broken wire, unchanged. No acute osseous abnormality. Arthritis of both shoulders. Calcification in the thoracic aorta. IMPRESSION: No acute abnormalities.  Aortic atherosclerosis. Electronically Signed   By: Francene BoyersJames  Maxwell M.D.   On: 11/16/2014 12:14   I have personally reviewed and evaluated these images and lab results as part of my medical decision-making.   EKG Interpretation   Date/Time:  Tuesday November 16 2014 10:43:46 EDT Ventricular Rate:  63 PR Interval:  166 QRS Duration: 83 QT Interval:  444 QTC Calculation: 454 R Axis:   68 Text Interpretation:  Atrial-paced rhythm Probable anteroseptal infarct,  old Minimal ST depression, anterolateral leads , similar appearance to  prior ECG Baseline wander in lead(s) V1 Confirmed by Jasamine Pottinger  MD-J, Kathalene Sporer  ([id number]) on 11/16/2014 11:06:41 AM      MDM   Final diagnoses:  Gastroesophageal reflux disease, esophagitis presence not specified   Lab tests are reassuring.  Doubt cardiac etiology based on her duration of sx and her negative cardiac enzymes.    Suspect GI, GERD.  US is negative. Labs are normal.  Discussed bile duct finding with patient and possible need for outpatient MRI.  Will add carafate.  Follow up with her PCP     Linwood DibblesJon Illyanna Petillo, MD 11/16/14 [phone]

## 2014-11-16 NOTE — Discharge Instructions (Signed)
Gastroesophageal Reflux Disease, Adult Normally, food travels down the esophagus and stays in the stomach to be digested. If a person has gastroesophageal reflux disease (GERD), food and stomach acid move back up into the esophagus. When this happens, the esophagus becomes sore and swollen (inflamed). Over time, GERD can make small holes (ulcers) in the lining of the esophagus. HOME CARE Diet  Follow a diet as told by your doctor. You may need to avoid foods and drinks such as:  Coffee and tea (with or without caffeine).  Drinks that contain alcohol.  Energy drinks and sports drinks.  Carbonated drinks or sodas.  Chocolate and cocoa.  Peppermint and mint flavorings.  Garlic and onions.  Horseradish.  Spicy and acidic foods, such as peppers, chili powder, curry powder, vinegar, hot sauces, and BBQ sauce.  Citrus fruit juices and citrus fruits, such as oranges, lemons, and limes.  Tomato-based foods, such as red sauce, chili, salsa, and pizza with red sauce.  Fried and fatty foods, such as donuts, french fries, potato chips, and high-fat dressings.  High-fat meats, such as hot dogs, rib eye steak, sausage, ham, and bacon.  High-fat dairy items, such as whole milk, butter, and cream cheese.  Eat small meals often. Avoid eating large meals.  Avoid drinking large amounts of liquid with your meals.  Avoid eating meals during the 2-3 hours before bedtime.  Avoid lying down right after you eat.  Do not exercise right after you eat. General Instructions  Pay attention to any changes in your symptoms.  Take over-the-counter and prescription medicines only as told by your doctor. Do not take aspirin, ibuprofen, or other NSAIDs unless your doctor says it is okay.  Do not use any tobacco products, including cigarettes, chewing tobacco, and e-cigarettes. If you need help quitting, ask your doctor.  Wear loose clothes. Do not wear anything tight around your waist.  Raise  (elevate) the head of your bed about 6 inches (15 cm).  Try to lower your stress. If you need help doing this, ask your doctor.  If you are overweight, lose an amount of weight that is healthy for you. Ask your doctor about a safe weight loss goal.  Keep all follow-up visits as told by your doctor. This is important. GET HELP IF:  You have new symptoms.  You lose weight and you do not know why it is happening.  You have trouble swallowing, or it hurts to swallow.  You have wheezing or a cough that keeps happening.  Your symptoms do not get better with treatment.  You have a hoarse voice. GET HELP RIGHT AWAY IF:  You have pain in your arms, neck, jaw, teeth, or back.  You feel sweaty, dizzy, or light-headed.  You have chest pain or shortness of breath.  You throw up (vomit) and your throw up looks like blood or coffee grounds.  You pass out (faint).  Your poop (stool) is bloody or black.  You cannot swallow, drink, or eat.   This information is not intended to replace advice given to you by your health care provider. Make sure you discuss any questions you have with your health care provider.   Document Released: 06/20/2007 Document Revised: 09/22/2014 Document Reviewed: 04/28/2014 Elsevier Interactive Patient Education 2016 Elsevier Inc.  -   

## 2014-12-02 ENCOUNTER — Encounter (HOSPITAL_COMMUNITY): Payer: Self-pay

## 2014-12-02 ENCOUNTER — Ambulatory Visit (HOSPITAL_COMMUNITY)
Admission: RE | Admit: 2014-12-02 | Discharge: 2014-12-02 | Disposition: A | Discharge status: Home / Self Care | Payer: Medicare Other | Source: Ambulatory Visit | Attending: Internal Medicine | Admitting: Internal Medicine

## 2014-12-02 ENCOUNTER — Other Ambulatory Visit (HOSPITAL_COMMUNITY): Payer: Self-pay | Admitting: Internal Medicine

## 2014-12-02 DIAGNOSIS — M810 Age-related osteoporosis without current pathological fracture: Secondary | ICD-10-CM | POA: Insufficient documentation

## 2014-12-02 MED ORDER — DENOSUMAB 60 MG/ML ~~LOC~~ SOLN
60.0000 mg | Freq: Once | SUBCUTANEOUS | Status: AC
  Administered 2014-12-02: 60 mg via SUBCUTANEOUS
  Filled 2014-12-02: qty 1

## 2014-12-02 NOTE — Progress Notes (Signed)
Pt received her Prolia injection today.  D/c paperwork on Prolia given and explained to pt.  Pt stayed 15 minutes post injection to monitor for any allergic reaction.  Pt tolerated well.  Pt d/c ambulatory with cane and husband by side to lobby.

## 2014-12-02 NOTE — Discharge Instructions (Signed)
Denosumab injection  What is this medicine?  DENOSUMAB (den oh sue mab) slows bone breakdown. Prolia is used to treat osteoporosis in women after menopause and in men. Xgeva is used to prevent bone fractures and other bone problems caused by cancer bone metastases. Xgeva is also used to treat giant cell tumor of the bone.  This medicine may be used for other purposes; ask your health care provider or pharmacist if you have questions.  What should I tell my health care provider before I take this medicine?  They need to know if you have any of these conditions:  -dental disease  -eczema  -infection or history of infections  -kidney disease or on dialysis  -low blood calcium or vitamin D  -malabsorption syndrome  -scheduled to have surgery or tooth extraction  -taking medicine that contains denosumab  -thyroid or parathyroid disease  -an unusual reaction to denosumab, other medicines, foods, dyes, or preservatives  -pregnant or trying to get pregnant  -breast-feeding  How should I use this medicine?  This medicine is for injection under the skin. It is given by a health care professional in a hospital or clinic setting.  If you are getting Prolia, a special MedGuide will be given to you by the pharmacist with each prescription and refill. Be sure to read this information carefully each time.  For Prolia, talk to your pediatrician regarding the use of this medicine in children. Special care may be needed. For Xgeva, talk to your pediatrician regarding the use of this medicine in children. While this drug may be prescribed for children as young as 13 years for selected conditions, precautions do apply.  Overdosage: If you think you have taken too much of this medicine contact a poison control center or emergency room at once.  NOTE: This medicine is only for you. Do not share this medicine with others.  What if I miss a dose?  It is important not to miss your dose. Call your doctor or health care professional if you are  unable to keep an appointment.  What may interact with this medicine?  Do not take this medicine with any of the following medications:  -other medicines containing denosumab  This medicine may also interact with the following medications:  -medicines that suppress the immune system  -medicines that treat cancer  -steroid medicines like prednisone or cortisone  This list may not describe all possible interactions. Give your health care provider a list of all the medicines, herbs, non-prescription drugs, or dietary supplements you use. Also tell them if you smoke, drink alcohol, or use illegal drugs. Some items may interact with your medicine.  What should I watch for while using this medicine?  Visit your doctor or health care professional for regular checks on your progress. Your doctor or health care professional may order blood tests and other tests to see how you are doing.  Call your doctor or health care professional if you get a cold or other infection while receiving this medicine. Do not treat yourself. This medicine may decrease your body's ability to fight infection.  You should make sure you get enough calcium and vitamin D while you are taking this medicine, unless your doctor tells you not to. Discuss the foods you eat and the vitamins you take with your health care professional.  See your dentist regularly. Brush and floss your teeth as directed. Before you have any dental work done, tell your dentist you are receiving this medicine.  Do   not become pregnant while taking this medicine or for 5 months after stopping it. Women should inform their doctor if they wish to become pregnant or think they might be pregnant. There is a potential for serious side effects to an unborn child. Talk to your health care professional or pharmacist for more information.  What side effects may I notice from receiving this medicine?  Side effects that you should report to your doctor or health care professional as soon as  possible:  -allergic reactions like skin rash, itching or hives, swelling of the face, lips, or tongue  -breathing problems  -chest pain  -fast, irregular heartbeat  -feeling faint or lightheaded, falls  -fever, chills, or any other sign of infection  -muscle spasms, tightening, or twitches  -numbness or tingling  -skin blisters or bumps, or is dry, peels, or red  -slow healing or unexplained pain in the mouth or jaw  -unusual bleeding or bruising  Side effects that usually do not require medical attention (Report these to your doctor or health care professional if they continue or are bothersome.):  -muscle pain  -stomach upset, gas  This list may not describe all possible side effects. Call your doctor for medical advice about side effects. You may report side effects to FDA at 1-800-FDA-1088.  Where should I keep my medicine?  This medicine is only given in a clinic, doctor's office, or other health care setting and will not be stored at home.  NOTE: This sheet is a summary. It may not cover all possible information. If you have questions about this medicine, talk to your doctor, pharmacist, or health care provider.      2016, Elsevier/Gold Standard. (2011-07-02 12:37:47)

## 2014-12-06 ENCOUNTER — Telehealth: Payer: Self-pay | Admitting: Cardiology

## 2014-12-06 ENCOUNTER — Ambulatory Visit (INDEPENDENT_AMBULATORY_CARE_PROVIDER_SITE_OTHER): Payer: Medicare Other | Admitting: *Deleted

## 2014-12-06 DIAGNOSIS — I495 Sick sinus syndrome: Secondary | ICD-10-CM | POA: Diagnosis not present

## 2014-12-06 NOTE — Telephone Encounter (Signed)
Spoke with pt and reminded pt of remote transmission that is due today. Pt verbalized understanding.   

## 2014-12-06 NOTE — Progress Notes (Signed)
Remote pacemaker transmission.   

## 2014-12-07 ENCOUNTER — Encounter: Payer: Self-pay | Admitting: Cardiology

## 2014-12-07 LAB — CUP PACEART REMOTE DEVICE CHECK
Battery Impedance: 135 Ohm
Battery Remaining Longevity: 140 mo
Battery Voltage: 2.79 V
Brady Statistic AS VP Percent: 0 %
Date Time Interrogation Session: 20161121171351
Implantable Lead Implant Date: 20060208
Implantable Lead Location: 753860
Implantable Lead Model: 4469
Implantable Lead Model: 4470
Implantable Lead Serial Number: 4470032319
Implantable Lead Serial Number: 449276
Lead Channel Impedance Value: 734 Ohm
Lead Channel Pacing Threshold Pulse Width: 0.4 ms
Lead Channel Setting Pacing Amplitude: 2 V
Lead Channel Setting Pacing Amplitude: 2.5 V
Lead Channel Setting Sensing Sensitivity: 2 mV
MDC IDC LEAD IMPLANT DT: 20050902
MDC IDC LEAD LOCATION: 753859
MDC IDC MSMT LEADCHNL RA PACING THRESHOLD AMPLITUDE: 0.75 V
MDC IDC MSMT LEADCHNL RA PACING THRESHOLD PULSEWIDTH: 0.4 ms
MDC IDC MSMT LEADCHNL RV IMPEDANCE VALUE: 371 Ohm
MDC IDC MSMT LEADCHNL RV PACING THRESHOLD AMPLITUDE: 0.75 V
MDC IDC MSMT LEADCHNL RV SENSING INTR AMPL: 5.6 mV
MDC IDC SET LEADCHNL RV PACING PULSEWIDTH: 0.4 ms
MDC IDC STAT BRADY AP VP PERCENT: 0 %
MDC IDC STAT BRADY AP VS PERCENT: 100 %
MDC IDC STAT BRADY AS VS PERCENT: 0 %

## 2015-03-07 ENCOUNTER — Ambulatory Visit (INDEPENDENT_AMBULATORY_CARE_PROVIDER_SITE_OTHER): Payer: Medicare Other | Admitting: *Deleted

## 2015-03-07 DIAGNOSIS — I495 Sick sinus syndrome: Secondary | ICD-10-CM

## 2015-03-07 NOTE — Progress Notes (Signed)
Remote pacemaker transmission.   

## 2015-03-29 DIAGNOSIS — R928 Other abnormal and inconclusive findings on diagnostic imaging of breast: Secondary | ICD-10-CM | POA: Diagnosis not present

## 2015-03-31 LAB — CUP PACEART REMOTE DEVICE CHECK
Battery Voltage: 2.79 V
Brady Statistic AS VP Percent: 0 %
Implantable Lead Implant Date: 20060208
Implantable Lead Location: 753859
Implantable Lead Model: 4469
Implantable Lead Model: 4470
Implantable Lead Serial Number: 449276
Lead Channel Impedance Value: 353 Ohm
Lead Channel Pacing Threshold Pulse Width: 0.4 ms
Lead Channel Pacing Threshold Pulse Width: 0.4 ms
Lead Channel Setting Pacing Amplitude: 2.5 V
Lead Channel Setting Sensing Sensitivity: 2 mV
MDC IDC LEAD IMPLANT DT: 20050902
MDC IDC LEAD LOCATION: 753860
MDC IDC LEAD SERIAL: 4470032319
MDC IDC MSMT BATTERY IMPEDANCE: 111 Ohm
MDC IDC MSMT BATTERY REMAINING LONGEVITY: 146 mo
MDC IDC MSMT LEADCHNL RA IMPEDANCE VALUE: 672 Ohm
MDC IDC MSMT LEADCHNL RA PACING THRESHOLD AMPLITUDE: 0.75 V
MDC IDC MSMT LEADCHNL RV PACING THRESHOLD AMPLITUDE: 0.75 V
MDC IDC SESS DTM: 20170220200008
MDC IDC SET LEADCHNL RA PACING AMPLITUDE: 2 V
MDC IDC SET LEADCHNL RV PACING PULSEWIDTH: 0.4 ms
MDC IDC STAT BRADY AP VP PERCENT: 0 %
MDC IDC STAT BRADY AP VS PERCENT: 100 %
MDC IDC STAT BRADY AS VS PERCENT: 0 %

## 2015-04-01 ENCOUNTER — Encounter: Payer: Self-pay | Admitting: Cardiology

## 2015-04-08 ENCOUNTER — Telehealth: Payer: Self-pay | Admitting: Internal Medicine

## 2015-04-08 NOTE — Telephone Encounter (Signed)
New Message  Pt requesting to speak w/ Device concerning her remote transmission. Please call back and discuss.

## 2015-04-08 NOTE — Telephone Encounter (Signed)
Spoke w/ pt and informed her that we have changed things up a little bit in the office and that from now on her letter will look like the one she just got.  It will no longer say if the transmission was normal. I informed her that if there was ever anything wrong or changes needed to be someone would call her about that information. Pt verbalized understanding.

## 2015-05-17 DIAGNOSIS — Z6820 Body mass index (BMI) 20.0-20.9, adult: Secondary | ICD-10-CM | POA: Diagnosis not present

## 2015-05-17 DIAGNOSIS — M810 Age-related osteoporosis without current pathological fracture: Secondary | ICD-10-CM | POA: Diagnosis not present

## 2015-05-17 DIAGNOSIS — R42 Dizziness and giddiness: Secondary | ICD-10-CM | POA: Diagnosis not present

## 2015-05-17 DIAGNOSIS — G43909 Migraine, unspecified, not intractable, without status migrainosus: Secondary | ICD-10-CM | POA: Diagnosis not present

## 2015-05-23 ENCOUNTER — Other Ambulatory Visit (HOSPITAL_COMMUNITY): Payer: Self-pay | Admitting: *Deleted

## 2015-05-24 ENCOUNTER — Encounter (HOSPITAL_COMMUNITY)
Admission: RE | Admit: 2015-05-24 | Discharge: 2015-05-24 | Disposition: A | Discharge status: Home / Self Care | Payer: Medicare Other | Source: Ambulatory Visit | Attending: Internal Medicine | Admitting: Internal Medicine

## 2015-05-24 DIAGNOSIS — M810 Age-related osteoporosis without current pathological fracture: Secondary | ICD-10-CM | POA: Diagnosis not present

## 2015-05-24 MED ORDER — DENOSUMAB 60 MG/ML ~~LOC~~ SOLN
60.0000 mg | Freq: Once | SUBCUTANEOUS | Status: AC
  Administered 2015-05-24: 60 mg via SUBCUTANEOUS
  Filled 2015-05-24: qty 1

## 2015-06-20 ENCOUNTER — Ambulatory Visit (INDEPENDENT_AMBULATORY_CARE_PROVIDER_SITE_OTHER): Payer: Medicare Other | Admitting: Internal Medicine

## 2015-06-20 ENCOUNTER — Encounter: Payer: Self-pay | Admitting: Internal Medicine

## 2015-06-20 VITALS — BP 142/74 | HR 69 | Ht 64.0 in | Wt 122.8 lb

## 2015-06-20 DIAGNOSIS — E785 Hyperlipidemia, unspecified: Secondary | ICD-10-CM

## 2015-06-20 DIAGNOSIS — I495 Sick sinus syndrome: Secondary | ICD-10-CM

## 2015-06-20 NOTE — Patient Instructions (Signed)
Medication Instructions:  Your physician recommends that you continue on your current medications as directed. Please refer to the Current Medication list given to you today.   Labwork: None ordered   Testing/Procedures: None ordered   Follow-Up: Your physician wants you to follow-up in: 12 months with Dr Johney FrameAllred Bonita QuinYou will receive a reminder letter in the mail two months in advance. If you don't receive a letter, please call our office to schedule the follow-up appointment.  Remote monitoring is used to monitor your ICD from home. This monitoring reduces the number of office visits required to check your device to one time per year. It allows us to keep an eye on the functioning of your device to ensure it is working properly. You are scheduled for a device check from home on 09/20/15. You may send your transmission at any time that day. If you have a wireless device, the transmission will be sent automatically. After your physician reviews your transmission, you will receive a postcard with your next transmission date.     Any Other Special Instructions Will Be Listed Below (If Applicable).     If you need a refill on your cardiac medications before your next appointment, please call your pharmacy.

## 2015-06-20 NOTE — Progress Notes (Signed)
PCP: Ezequiel Kayser, MD  Robin Atkins is a 82 y.o. female who presents today for routine electrophysiology followup.  Since her last visit, the patient reports doing very well.  She remains very active without any ischemic symptoms.  Today, she denies symptoms of palpitations, chest pain, shortness of breath,  lower extremity edema, dizziness, presyncope, or syncope.  The patient is otherwise without complaint today.   Past Medical History  Diagnosis Date  . MVP (mitral valve prolapse)   . Hyperlipidemia   . OP (osteoporosis)   . SSS (sick sinus syndrome) (HCC)     s/p PTVP  . Hypothyroidism   . Broken foot     Broken right foot and broken left knee in 2011  . S/P cardiac pacemaker procedure     Original implant 1998 with generator change in 2005. She has had prior lead revision.   . Knee cap dislocation 2011    left  . Hx of migraine headaches   . Benign recurrent vertigo   . Diverticulosis   . Shingles   . GERD (gastroesophageal reflux disease)   . Wrist fracture     lt  . Cataracts, bilateral   . Hemorrhoids   . DDD (degenerative disc disease), lumbar   . Lumbar radiculopathy    Past Surgical History  Procedure Laterality Date  . Total hip arthroplasty      right  . Pacemaker insertion  1998/2005    TRANSVENOUS PACEMAKER  . US echocardiography  11/25/2008    EF 55-60%; mild to moderate AI  . Cardiovascular stress test  07/10/2005    EF 78%, NO ISCHEMIA  . Hemorroidectomy    . Orif femur fracture Right 04/29/2012    Procedure: OPEN REDUCTION INTERNAL FIXATION (ORIF) DISTAL FEMUR FRACTURE;  Surgeon: Eulas Post, MD;  Location: [facility] OR;  Service: Orthopedics;  Laterality: Right;  biomet long condylar plate, flat jackson, cerclage wires, big c arm  . Pacemaker generator change  02/09/13    MDT Adapta L gen change by Dr [name]  . Pacemaker generator change N/A 02/09/2013    Procedure: PACEMAKER GENERATOR CHANGE;  Surgeon: Gardiner Rhyme, MD;  Location: [facility] CATH LAB;   Service: Cardiovascular;  Laterality: N/A;    Current Outpatient Prescriptions  Medication Sig Dispense Refill  . aspirin 325 MG tablet Take 325 mg by mouth every 4 (four) hours as needed for mild pain, moderate pain, fever or headache.    Marland Kitchen aspirin EC 81 MG tablet Take 81 mg by mouth daily.    . calcium carbonate (TUMS EX) 750 MG chewable tablet Chew 1 tablet by mouth 2 (two) times daily as needed for heartburn.     . Cholecalciferol (VITAMIN D) 2000 UNITS tablet Take 2,000 Units by mouth daily.    Marland Kitchen levothyroxine (SYNTHROID, LEVOTHROID) 50 MCG tablet Take 1 tablet by mouth daily.  1  . Omega-3 Fatty Acids (FISH OIL PO) Take 1 tablet by mouth daily.    Marland Kitchen omeprazole (PRILOSEC) 20 MG capsule Take 20 mg by mouth as directed.     . pravastatin (PRAVACHOL) 20 MG tablet Take 20 mg by mouth at bedtime.     Marland Kitchen UNABLE TO FIND OUTPATIENT PHYSICAL THERAPY   Diagnosis: Left shoulder glenohumeral osteoarthritis 1 Mutually Defined 0  . vitamin B-12 (CYANOCOBALAMIN) 1000 MCG tablet Take 1,000 mcg by mouth daily.     No current facility-administered medications for this visit.    Physical Exam: Filed Vitals:   06/20/15 1419  BP:  142/74  Pulse: 69  Height: 5\' 4"  (1.626 m)  Weight: 122 lb 12.8 oz (55.702 kg)  SpO2: 99%    GEN- The patient is well appearing, alert and oriented x 3 today.   Head- normocephalic, atraumatic Eyes-  Sclera clear, conjunctiva pink Ears- hearing intact Oropharynx- clear Lungs- Clear to ausculation bilaterally, normal work of breathing Chest- pacemaker pocket is well healed Heart- Regular rate and rhythm, no murmurs, rubs or gallops, PMI not laterally displaced GI- soft, NT, ND, + BS Extremities- no clubbing, cyanosis, or edema  Pacemaker interrogation- reviewed in detail today,  See PACEART report  Assessment and Plan:  1. Sick sinus syndrome Normal pacemaker function See Pace Art report No changes today  2. HL Stable No change required today  3.  HTN Reports good control at home  carelink Return to see me in 1 year  Hillis RangeJames Welles Walthall MD, [facility] 06/20/2015 2:57 PM

## 2015-09-16 DIAGNOSIS — Z1231 Encounter for screening mammogram for malignant neoplasm of breast: Secondary | ICD-10-CM | POA: Diagnosis not present

## 2015-09-20 ENCOUNTER — Ambulatory Visit (INDEPENDENT_AMBULATORY_CARE_PROVIDER_SITE_OTHER): Payer: Medicare Other | Admitting: *Deleted

## 2015-09-20 DIAGNOSIS — I495 Sick sinus syndrome: Secondary | ICD-10-CM

## 2015-09-20 NOTE — Progress Notes (Signed)
Remote pacemaker transmission.   

## 2015-09-21 LAB — CUP PACEART REMOTE DEVICE CHECK
Battery Remaining Longevity: 134 mo
Brady Statistic AP VS Percent: 100 %
Brady Statistic AS VS Percent: 0 %
Implantable Lead Implant Date: 20050902
Implantable Lead Location: 753859
Implantable Lead Location: 753860
Lead Channel Impedance Value: 364 Ohm
Lead Channel Pacing Threshold Amplitude: 0.75 V
Lead Channel Sensing Intrinsic Amplitude: 5.6 mV
Lead Channel Setting Pacing Amplitude: 2 V
Lead Channel Setting Pacing Pulse Width: 0.4 ms
Lead Channel Setting Sensing Sensitivity: 2.8 mV
MDC IDC LEAD IMPLANT DT: 20060208
MDC IDC LEAD SERIAL: 4470032319
MDC IDC LEAD SERIAL: 449276
MDC IDC MSMT BATTERY IMPEDANCE: 159 Ohm
MDC IDC MSMT BATTERY VOLTAGE: 2.79 V
MDC IDC MSMT LEADCHNL RA IMPEDANCE VALUE: 734 Ohm
MDC IDC MSMT LEADCHNL RA PACING THRESHOLD AMPLITUDE: 0.75 V
MDC IDC MSMT LEADCHNL RA PACING THRESHOLD PULSEWIDTH: 0.4 ms
MDC IDC MSMT LEADCHNL RV PACING THRESHOLD PULSEWIDTH: 0.4 ms
MDC IDC SESS DTM: 20170905135906
MDC IDC SET LEADCHNL RV PACING AMPLITUDE: 2.5 V
MDC IDC STAT BRADY AP VP PERCENT: 0 %
MDC IDC STAT BRADY AS VP PERCENT: 0 %

## 2015-09-23 ENCOUNTER — Encounter: Payer: Self-pay | Admitting: Cardiology

## 2015-10-29 DIAGNOSIS — Z23 Encounter for immunization: Secondary | ICD-10-CM | POA: Diagnosis not present

## 2015-11-07 DIAGNOSIS — R42 Dizziness and giddiness: Secondary | ICD-10-CM | POA: Diagnosis not present

## 2015-11-07 DIAGNOSIS — Z6821 Body mass index (BMI) 21.0-21.9, adult: Secondary | ICD-10-CM | POA: Diagnosis not present

## 2015-11-10 DIAGNOSIS — R519 Headache, unspecified: Secondary | ICD-10-CM | POA: Insufficient documentation

## 2015-11-11 DIAGNOSIS — R42 Dizziness and giddiness: Secondary | ICD-10-CM | POA: Diagnosis not present

## 2015-11-11 DIAGNOSIS — R51 Headache: Secondary | ICD-10-CM | POA: Diagnosis not present

## 2015-11-18 ENCOUNTER — Telehealth: Payer: Self-pay | Admitting: Internal Medicine

## 2015-11-18 NOTE — Telephone Encounter (Signed)
New Message  Pt voiced have had bad headaches for two weeks.  Pt voiced PCP ordered brain scan received results pulse was low, balance not good, wondering if connection with pacemaker if it's causing this.

## 2015-11-18 NOTE — Telephone Encounter (Signed)
Called pt back and advised her to send in a remote transmission and it would be reviewed. And she would get a call back today with the results, pt voiced understanding.

## 2015-11-18 NOTE — Telephone Encounter (Addendum)
Called pt back and let her know that her transmission was received, and that per pacemaker was functioning appropriately and she not had any episodes that were abnormal for her. Pt stated that she had been taking her BP regularly and it was normal. Pt encouraged to follow-up with her primary care doctor and that he had any questions to call. Pt very thankful and voiced understanding.

## 2015-11-18 NOTE — Telephone Encounter (Signed)
Follow up      Pt c/o headache for 2 weeks.  She has had a brain scan. She want to have her pacemaker checked or be seen by someone.  Please call asap

## 2015-11-21 ENCOUNTER — Ambulatory Visit
Admission: RE | Admit: 2015-11-21 | Discharge: 2015-11-21 | Disposition: A | Discharge status: Home / Self Care | Payer: Medicare Other | Source: Ambulatory Visit | Attending: Internal Medicine | Admitting: Internal Medicine

## 2015-11-21 ENCOUNTER — Other Ambulatory Visit: Payer: Self-pay | Admitting: Internal Medicine

## 2015-11-21 DIAGNOSIS — M1990 Unspecified osteoarthritis, unspecified site: Secondary | ICD-10-CM

## 2015-11-21 DIAGNOSIS — Z8669 Personal history of other diseases of the nervous system and sense organs: Secondary | ICD-10-CM

## 2015-11-21 DIAGNOSIS — M50322 Other cervical disc degeneration at C5-C6 level: Secondary | ICD-10-CM | POA: Diagnosis not present

## 2015-11-24 DIAGNOSIS — M4692 Unspecified inflammatory spondylopathy, cervical region: Secondary | ICD-10-CM | POA: Insufficient documentation

## 2015-11-24 DIAGNOSIS — M5030 Other cervical disc degeneration, unspecified cervical region: Secondary | ICD-10-CM | POA: Insufficient documentation

## 2015-11-28 DIAGNOSIS — M542 Cervicalgia: Secondary | ICD-10-CM | POA: Diagnosis not present

## 2015-12-02 DIAGNOSIS — M542 Cervicalgia: Secondary | ICD-10-CM | POA: Diagnosis not present

## 2015-12-12 DIAGNOSIS — M542 Cervicalgia: Secondary | ICD-10-CM | POA: Diagnosis not present

## 2015-12-13 DIAGNOSIS — R42 Dizziness and giddiness: Secondary | ICD-10-CM | POA: Diagnosis not present

## 2015-12-13 DIAGNOSIS — M810 Age-related osteoporosis without current pathological fracture: Secondary | ICD-10-CM | POA: Diagnosis not present

## 2015-12-13 DIAGNOSIS — E038 Other specified hypothyroidism: Secondary | ICD-10-CM | POA: Diagnosis not present

## 2015-12-13 DIAGNOSIS — Z6821 Body mass index (BMI) 21.0-21.9, adult: Secondary | ICD-10-CM | POA: Diagnosis not present

## 2015-12-13 DIAGNOSIS — E784 Other hyperlipidemia: Secondary | ICD-10-CM | POA: Diagnosis not present

## 2015-12-13 DIAGNOSIS — E785 Hyperlipidemia, unspecified: Secondary | ICD-10-CM | POA: Diagnosis not present

## 2015-12-13 DIAGNOSIS — E039 Hypothyroidism, unspecified: Secondary | ICD-10-CM | POA: Diagnosis not present

## 2015-12-13 DIAGNOSIS — Z0000 Encounter for general adult medical examination without abnormal findings: Secondary | ICD-10-CM | POA: Diagnosis not present

## 2015-12-16 DIAGNOSIS — R51 Headache: Secondary | ICD-10-CM | POA: Diagnosis not present

## 2015-12-16 DIAGNOSIS — E784 Other hyperlipidemia: Secondary | ICD-10-CM | POA: Diagnosis not present

## 2015-12-16 DIAGNOSIS — Z0000 Encounter for general adult medical examination without abnormal findings: Secondary | ICD-10-CM | POA: Diagnosis not present

## 2015-12-16 DIAGNOSIS — R42 Dizziness and giddiness: Secondary | ICD-10-CM | POA: Diagnosis not present

## 2015-12-16 DIAGNOSIS — M810 Age-related osteoporosis without current pathological fracture: Secondary | ICD-10-CM | POA: Diagnosis not present

## 2015-12-16 DIAGNOSIS — Z950 Presence of cardiac pacemaker: Secondary | ICD-10-CM | POA: Diagnosis not present

## 2015-12-16 DIAGNOSIS — M4692 Unspecified inflammatory spondylopathy, cervical region: Secondary | ICD-10-CM | POA: Diagnosis not present

## 2015-12-16 DIAGNOSIS — Z1231 Encounter for screening mammogram for malignant neoplasm of breast: Secondary | ICD-10-CM | POA: Diagnosis not present

## 2015-12-16 DIAGNOSIS — D6489 Other specified anemias: Secondary | ICD-10-CM | POA: Diagnosis not present

## 2015-12-16 DIAGNOSIS — I700 Atherosclerosis of aorta: Secondary | ICD-10-CM | POA: Diagnosis not present

## 2015-12-20 ENCOUNTER — Telehealth: Payer: Self-pay | Admitting: Cardiology

## 2015-12-20 ENCOUNTER — Ambulatory Visit (INDEPENDENT_AMBULATORY_CARE_PROVIDER_SITE_OTHER): Payer: Medicare Other | Admitting: *Deleted

## 2015-12-20 DIAGNOSIS — I495 Sick sinus syndrome: Secondary | ICD-10-CM

## 2015-12-20 NOTE — Telephone Encounter (Signed)
LMOVM reminding pt to send remote transmission.   

## 2015-12-21 NOTE — Progress Notes (Signed)
Remote pacemaker transmission.   

## 2015-12-23 DIAGNOSIS — M542 Cervicalgia: Secondary | ICD-10-CM | POA: Diagnosis not present

## 2015-12-27 DIAGNOSIS — H903 Sensorineural hearing loss, bilateral: Secondary | ICD-10-CM | POA: Diagnosis not present

## 2015-12-27 DIAGNOSIS — R51 Headache: Secondary | ICD-10-CM | POA: Diagnosis not present

## 2015-12-27 DIAGNOSIS — R42 Dizziness and giddiness: Secondary | ICD-10-CM | POA: Diagnosis not present

## 2015-12-28 ENCOUNTER — Encounter: Payer: Self-pay | Admitting: Cardiology

## 2016-01-04 ENCOUNTER — Ambulatory Visit (HOSPITAL_COMMUNITY)
Admission: RE | Admit: 2016-01-04 | Discharge: 2016-01-04 | Disposition: A | Discharge status: Home / Self Care | Payer: Medicare Other | Source: Ambulatory Visit | Attending: Internal Medicine | Admitting: Internal Medicine

## 2016-01-04 ENCOUNTER — Encounter (HOSPITAL_COMMUNITY): Payer: Self-pay

## 2016-01-04 DIAGNOSIS — M810 Age-related osteoporosis without current pathological fracture: Secondary | ICD-10-CM | POA: Insufficient documentation

## 2016-01-04 MED ORDER — DENOSUMAB 60 MG/ML ~~LOC~~ SOLN
60.0000 mg | Freq: Once | SUBCUTANEOUS | Status: AC
  Administered 2016-01-04: 60 mg via SUBCUTANEOUS
  Filled 2016-01-04: qty 1

## 2016-01-04 NOTE — Discharge Instructions (Signed)
Denosumab injection/ Prolia °What is this medicine? °DENOSUMAB (den oh sue mab) slows bone breakdown. Prolia is used to treat osteoporosis in women after menopause and in men. Xgeva is used to prevent bone fractures and other bone problems caused by cancer bone metastases. Xgeva is also used to treat giant cell tumor of the bone. °COMMON BRAND NAME(S): Prolia, XGEVA °What should I tell my health care provider before I take this medicine? °They need to know if you have any of these conditions: °-dental disease °-eczema °-infection or history of infections °-kidney disease or on dialysis °-low blood calcium or vitamin D °-malabsorption syndrome °-scheduled to have surgery or tooth extraction °-taking medicine that contains denosumab °-thyroid or parathyroid disease °-an unusual reaction to denosumab, other medicines, foods, dyes, or preservatives °-pregnant or trying to get pregnant °-breast-feeding °How should I use this medicine? °This medicine is for injection under the skin. It is given by a health care professional in a hospital or clinic setting. °If you are getting Prolia, a special MedGuide will be given to you by the pharmacist with each prescription and refill. Be sure to read this information carefully each time. °For Prolia, talk to your pediatrician regarding the use of this medicine in children. Special care may be needed. For Xgeva, talk to your pediatrician regarding the use of this medicine in children. While this drug may be prescribed for children as young as 13 years for selected conditions, precautions do apply. °What if I miss a dose? °It is important not to miss your dose. Call your doctor or health care professional if you are unable to keep an appointment. °What may interact with this medicine? °Do not take this medicine with any of the following medications: °-other medicines containing denosumab °This medicine may also interact with the following medications: °-medicines that suppress the  immune system °-medicines that treat cancer °-steroid medicines like prednisone or cortisone °What should I watch for while using this medicine? °Visit your doctor or health care professional for regular checks on your progress. Your doctor or health care professional may order blood tests and other tests to see how you are doing. °Call your doctor or health care professional if you get a cold or other infection while receiving this medicine. Do not treat yourself. This medicine may decrease your body's ability to fight infection. °You should make sure you get enough calcium and vitamin D while you are taking this medicine, unless your doctor tells you not to. Discuss the foods you eat and the vitamins you take with your health care professional. °See your dentist regularly. Brush and floss your teeth as directed. Before you have any dental work done, tell your dentist you are receiving this medicine. °Do not become pregnant while taking this medicine or for 5 months after stopping it. Women should inform their doctor if they wish to become pregnant or think they might be pregnant. There is a potential for serious side effects to an unborn child. Talk to your health care professional or pharmacist for more information. °What side effects may I notice from receiving this medicine? °Side effects that you should report to your doctor or health care professional as soon as possible: °-allergic reactions like skin rash, itching or hives, swelling of the face, lips, or tongue °-breathing problems °-chest pain °-fast, irregular heartbeat °-feeling faint or lightheaded, falls °-fever, chills, or any other sign of infection °-muscle spasms, tightening, or twitches °-numbness or tingling °-skin blisters or bumps, or is dry, peels, or red °-  slow healing or unexplained pain in the mouth or jaw °-unusual bleeding or bruising °Side effects that usually do not require medical attention (report to your doctor or health care  professional if they continue or are bothersome): °-muscle pain °-stomach upset, gas °Where should I keep my medicine? °This medicine is only given in a clinic, doctor's office, or other health care setting and will not be stored at home. °© 2017 Elsevier/Gold Standard (2015-02-03 10:06:55) ° °

## 2016-01-05 LAB — CUP PACEART REMOTE DEVICE CHECK
Battery Impedance: 159 Ohm
Battery Voltage: 2.79 V
Brady Statistic AP VS Percent: 100 %
Brady Statistic AS VS Percent: 0 %
Date Time Interrogation Session: 20171205203717
Implantable Lead Location: 753859
Implantable Lead Location: 753860
Implantable Lead Model: 4469
Implantable Lead Serial Number: 4470032319
Implantable Lead Serial Number: 449276
Lead Channel Impedance Value: 355 Ohm
Lead Channel Pacing Threshold Pulse Width: 0.4 ms
Lead Channel Pacing Threshold Pulse Width: 0.4 ms
Lead Channel Setting Pacing Amplitude: 2.5 V
Lead Channel Setting Pacing Pulse Width: 0.4 ms
Lead Channel Setting Sensing Sensitivity: 2.8 mV
MDC IDC LEAD IMPLANT DT: 20050902
MDC IDC LEAD IMPLANT DT: 20060208
MDC IDC MSMT BATTERY REMAINING LONGEVITY: 131 mo
MDC IDC MSMT LEADCHNL RA IMPEDANCE VALUE: 607 Ohm
MDC IDC MSMT LEADCHNL RA PACING THRESHOLD AMPLITUDE: 0.75 V
MDC IDC MSMT LEADCHNL RV PACING THRESHOLD AMPLITUDE: 0.75 V
MDC IDC PG IMPLANT DT: 20150126
MDC IDC SET LEADCHNL RA PACING AMPLITUDE: 2 V
MDC IDC STAT BRADY AP VP PERCENT: 0 %
MDC IDC STAT BRADY AS VP PERCENT: 0 %

## 2016-02-16 DIAGNOSIS — H52223 Regular astigmatism, bilateral: Secondary | ICD-10-CM | POA: Diagnosis not present

## 2016-03-20 ENCOUNTER — Ambulatory Visit (INDEPENDENT_AMBULATORY_CARE_PROVIDER_SITE_OTHER): Payer: Medicare Other | Admitting: *Deleted

## 2016-03-20 DIAGNOSIS — I495 Sick sinus syndrome: Secondary | ICD-10-CM

## 2016-03-20 NOTE — Progress Notes (Signed)
Remote pacemaker transmission.   

## 2016-03-21 ENCOUNTER — Encounter: Payer: Self-pay | Admitting: Cardiology

## 2016-03-21 LAB — CUP PACEART REMOTE DEVICE CHECK
Battery Remaining Longevity: 128 mo
Brady Statistic AP VS Percent: 100 %
Brady Statistic AS VS Percent: 0 %
Date Time Interrogation Session: 20180306165150
Implantable Lead Implant Date: 20050902
Implantable Lead Location: 753859
Implantable Lead Model: 4470
Implantable Lead Serial Number: 449276
Lead Channel Impedance Value: 369 Ohm
Lead Channel Impedance Value: 708 Ohm
Lead Channel Pacing Threshold Amplitude: 0.75 V
Lead Channel Pacing Threshold Pulse Width: 0.4 ms
Lead Channel Pacing Threshold Pulse Width: 0.4 ms
MDC IDC LEAD IMPLANT DT: 20060208
MDC IDC LEAD LOCATION: 753860
MDC IDC LEAD SERIAL: 4470032319
MDC IDC MSMT BATTERY IMPEDANCE: 183 Ohm
MDC IDC MSMT BATTERY VOLTAGE: 2.79 V
MDC IDC MSMT LEADCHNL RA PACING THRESHOLD AMPLITUDE: 0.75 V
MDC IDC PG IMPLANT DT: 20150126
MDC IDC SET LEADCHNL RA PACING AMPLITUDE: 2 V
MDC IDC SET LEADCHNL RV PACING AMPLITUDE: 2.5 V
MDC IDC SET LEADCHNL RV PACING PULSEWIDTH: 0.4 ms
MDC IDC SET LEADCHNL RV SENSING SENSITIVITY: 2.8 mV
MDC IDC STAT BRADY AP VP PERCENT: 0 %
MDC IDC STAT BRADY AS VP PERCENT: 0 %

## 2016-05-02 DIAGNOSIS — L820 Inflamed seborrheic keratosis: Secondary | ICD-10-CM | POA: Diagnosis not present

## 2016-06-05 ENCOUNTER — Other Ambulatory Visit: Payer: Self-pay | Admitting: Internal Medicine

## 2016-06-05 DIAGNOSIS — K5730 Diverticulosis of large intestine without perforation or abscess without bleeding: Secondary | ICD-10-CM | POA: Insufficient documentation

## 2016-06-05 DIAGNOSIS — R1084 Generalized abdominal pain: Secondary | ICD-10-CM

## 2016-06-06 ENCOUNTER — Ambulatory Visit
Admission: RE | Admit: 2016-06-06 | Discharge: 2016-06-06 | Disposition: A | Discharge status: Home / Self Care | Payer: Medicare Other | Source: Ambulatory Visit | Attending: Internal Medicine | Admitting: Internal Medicine

## 2016-06-06 DIAGNOSIS — R1084 Generalized abdominal pain: Secondary | ICD-10-CM

## 2016-06-06 DIAGNOSIS — R109 Unspecified abdominal pain: Secondary | ICD-10-CM | POA: Diagnosis not present

## 2016-06-06 MED ORDER — IOPAMIDOL (ISOVUE-300) INJECTION 61%
100.0000 mL | Freq: Once | INTRAVENOUS | Status: AC | PRN
  Administered 2016-06-06: 100 mL via INTRAVENOUS

## 2016-06-15 DIAGNOSIS — M810 Age-related osteoporosis without current pathological fracture: Secondary | ICD-10-CM | POA: Diagnosis not present

## 2016-06-15 DIAGNOSIS — K219 Gastro-esophageal reflux disease without esophagitis: Secondary | ICD-10-CM | POA: Diagnosis not present

## 2016-06-15 DIAGNOSIS — Z950 Presence of cardiac pacemaker: Secondary | ICD-10-CM | POA: Diagnosis not present

## 2016-06-15 DIAGNOSIS — R1084 Generalized abdominal pain: Secondary | ICD-10-CM | POA: Diagnosis not present

## 2016-06-20 ENCOUNTER — Encounter (INDEPENDENT_AMBULATORY_CARE_PROVIDER_SITE_OTHER): Payer: Self-pay

## 2016-06-20 ENCOUNTER — Encounter: Payer: Self-pay | Admitting: Internal Medicine

## 2016-06-20 ENCOUNTER — Ambulatory Visit (INDEPENDENT_AMBULATORY_CARE_PROVIDER_SITE_OTHER): Payer: Medicare Other | Admitting: Internal Medicine

## 2016-06-20 VITALS — BP 124/80 | HR 76 | Ht 64.0 in | Wt 124.6 lb

## 2016-06-20 DIAGNOSIS — I495 Sick sinus syndrome: Secondary | ICD-10-CM

## 2016-06-20 DIAGNOSIS — E7800 Pure hypercholesterolemia, unspecified: Secondary | ICD-10-CM | POA: Diagnosis not present

## 2016-06-20 DIAGNOSIS — I10 Essential (primary) hypertension: Secondary | ICD-10-CM | POA: Diagnosis not present

## 2016-06-20 NOTE — Progress Notes (Signed)
PCP: Rodrigo RanPerini, Mark, MD  Robin Atkins is a 83 y.o. female who presents today for routine electrophysiology followup.  Since last being seen in our clinic, the patient reports doing very well.  She remains active for her age.  She and her husband visit a friend who had a severe stroke.  She also enjoys visiting other shutins.  [facility] [employer] and is pleased that they have a new pastor.  Today, she denies symptoms of palpitations, chest pain, shortness of breath,  lower extremity edema, dizziness, presyncope, or syncope.  The patient is otherwise without complaint today.   Past Medical History:  Diagnosis Date  . Benign recurrent vertigo   . Broken foot    Broken right foot and broken left knee in 2011  . Cataracts, bilateral   . DDD (degenerative disc disease), lumbar   . Diverticulosis   . GERD (gastroesophageal reflux disease)   . Hemorrhoids   . Hx of migraine headaches   . Hyperlipidemia   . Hypothyroidism   . Knee cap dislocation 2011   left  . Lumbar radiculopathy   . MVP (mitral valve prolapse)   . OP (osteoporosis)   . S/P cardiac pacemaker procedure    Original implant 1998 with generator change in 2005. She has had prior lead revision.   . Shingles   . SSS (sick sinus syndrome) (HCC)    s/p PTVP  . Wrist fracture    lt   Past Surgical History:  Procedure Laterality Date  . CARDIOVASCULAR STRESS TEST  07/10/2005   EF 78%, NO ISCHEMIA  . HEMORROIDECTOMY    . ORIF FEMUR FRACTURE Right 04/29/2012   Procedure: OPEN REDUCTION INTERNAL FIXATION (ORIF) DISTAL FEMUR FRACTURE;  Surgeon: Eulas PostJoshua P Landau, MD;  Location: [facility] OR;  Service: Orthopedics;  Laterality: Right;  biomet long condylar plate, flat jackson, cerclage wires, big c arm  . PACEMAKER GENERATOR CHANGE  02/09/13   MDT Adapta L gen change by Dr Johney FrameAllred  . PACEMAKER GENERATOR CHANGE N/A 02/09/2013   Procedure: PACEMAKER GENERATOR CHANGE;  Surgeon: Gardiner RhymeJames D Tanay Massiah, MD;  Location: [facility] CATH LAB;   Service: Cardiovascular;  Laterality: N/A;  . PACEMAKER INSERTION  1998/2005   TRANSVENOUS PACEMAKER  . TOTAL HIP ARTHROPLASTY     right  . US ECHOCARDIOGRAPHY  11/25/2008   EF 55-60%; mild to moderate AI    ROS- all systems are reviewed and negative except as per HPI above  Current Outpatient Prescriptions  Medication Sig Dispense Refill  . aspirin 325 MG tablet Take 325 mg by mouth every 4 (four) hours as needed for mild pain, moderate pain, fever or headache.    Marland Kitchen. aspirin EC 81 MG tablet Take 81 mg by mouth daily.    . calcium carbonate (TUMS EX) 750 MG chewable tablet Chew 1 tablet by mouth 2 (two) times daily as needed for heartburn.     . Cholecalciferol (VITAMIN D) 2000 UNITS tablet Take 2,000 Units by mouth daily.    Marland Kitchen. levothyroxine (SYNTHROID, LEVOTHROID) 50 MCG tablet Take 1 tablet by mouth daily.  1  . Omega-3 Fatty Acids (FISH OIL PO) Take 1 tablet by mouth daily.     Marland Kitchen. omeprazole (PRILOSEC) 20 MG capsule Take 20 mg by mouth as directed.     . pravastatin (PRAVACHOL) 20 MG tablet Take 20 mg by mouth at bedtime.     Marland Kitchen. UNABLE TO FIND OUTPATIENT PHYSICAL THERAPY   Diagnosis: Left shoulder glenohumeral osteoarthritis 1 Mutually  Defined 0  . vitamin B-12 (CYANOCOBALAMIN) 1000 MCG tablet Take 1,000 mcg by mouth daily.     No current facility-administered medications for this visit.     Physical Exam: Vitals:   06/20/16 1424  BP: 124/80  Pulse: 76  SpO2: 97%  Weight: 124 lb 9.6 oz (56.5 kg)  Height: 5\' 4"  (1.626 m)    GEN- The patient is well appearing, alert and oriented x 3 today.   Head- normocephalic, atraumatic Eyes-  Sclera clear, conjunctiva pink Ears- hearing intact Oropharynx- clear Lungs- Clear to ausculation bilaterally, normal work of breathing Chest- pacemaker pocket is well healed Heart- Regular rate and rhythm, no murmurs, rubs or gallops, PMI not laterally displaced GI- soft, NT, ND, + BS Extremities- no clubbing, cyanosis, or edema MS-  thin Walks slowly with a cane  Pacemaker interrogation- reviewed in detail today,  See PACEART report  ekg tracing ordered today is personally reviewed and reveals atrial pacing, otherwise normal ekg  Assessment and Plan:  1. Sick sinus syndrome Normal pacemaker function See Pace Art report No changes today  2. HTN Stable No change required today  3. HL Stable No change required today  carelink Return to see me in 1 year  Hillis Range MD, [facility] 06/20/2016 2:34 PM

## 2016-06-20 NOTE — Patient Instructions (Signed)
Medication Instructions:  Your physician recommends that you continue on your current medications as directed. Please refer to the Current Medication list given to you today.   Labwork: None ordered   Testing/Procedures: None ordered   Follow-Up: Remote monitoring is used to monitor your Pacemaker from home. This monitoring reduces the number of office visits required to check your device to one time per year. It allows us to keep an eye on the functioning of your device to ensure it is working properly. You are scheduled for a device check from home on 09/19/16. You may send your transmission at any time that day. If you have a wireless device, the transmission will be sent automatically. After your physician reviews your transmission, you will receive a postcard with your next transmission date.    Your physician wants you to follow-up in: 12 months with Dr Johney FrameAllred. You will receive a reminder letter in the mail two months in advance. If you don't receive a letter, please call our office to schedule the follow-up appointment.   Any Other Special Instructions Will Be Listed Below (If Applicable).     If you need a refill on your cardiac medications before your next appointment, please call your pharmacy.

## 2016-06-22 LAB — CUP PACEART INCLINIC DEVICE CHECK
Battery Impedance: 207 Ohm
Battery Remaining Longevity: 124 mo
Battery Voltage: 2.79 V
Brady Statistic AP VS Percent: 100 %
Brady Statistic AS VP Percent: 0 %
Brady Statistic AS VS Percent: 0 %
Implantable Lead Implant Date: 20050902
Implantable Lead Location: 753860
Implantable Lead Model: 4469
Implantable Lead Model: 4470
Implantable Lead Serial Number: 449276
Implantable Pulse Generator Implant Date: 20150126
Lead Channel Impedance Value: 369 Ohm
Lead Channel Impedance Value: 697 Ohm
Lead Channel Pacing Threshold Pulse Width: 0.4 ms
Lead Channel Pacing Threshold Pulse Width: 0.4 ms
Lead Channel Setting Pacing Amplitude: 2.5 V
Lead Channel Setting Pacing Pulse Width: 0.4 ms
MDC IDC LEAD IMPLANT DT: 20060208
MDC IDC LEAD LOCATION: 753859
MDC IDC LEAD SERIAL: 4470032319
MDC IDC MSMT LEADCHNL RA PACING THRESHOLD AMPLITUDE: 0.75 V
MDC IDC MSMT LEADCHNL RV PACING THRESHOLD AMPLITUDE: 0.75 V
MDC IDC MSMT LEADCHNL RV SENSING INTR AMPL: 8 mV
MDC IDC SESS DTM: 20180606182400
MDC IDC SET LEADCHNL RA PACING AMPLITUDE: 2 V
MDC IDC SET LEADCHNL RV SENSING SENSITIVITY: 2.8 mV
MDC IDC STAT BRADY AP VP PERCENT: 0 %

## 2016-07-05 ENCOUNTER — Encounter (HOSPITAL_COMMUNITY): Payer: Self-pay

## 2016-07-05 ENCOUNTER — Ambulatory Visit (HOSPITAL_COMMUNITY)
Admission: RE | Admit: 2016-07-05 | Discharge: 2016-07-05 | Disposition: A | Discharge status: Home / Self Care | Payer: Medicare Other | Source: Ambulatory Visit | Attending: Internal Medicine | Admitting: Internal Medicine

## 2016-07-05 DIAGNOSIS — M810 Age-related osteoporosis without current pathological fracture: Secondary | ICD-10-CM | POA: Insufficient documentation

## 2016-07-05 MED ORDER — DENOSUMAB 60 MG/ML ~~LOC~~ SOLN
60.0000 mg | Freq: Once | SUBCUTANEOUS | Status: AC
  Administered 2016-07-05: 60 mg via SUBCUTANEOUS
  Filled 2016-07-05: qty 1

## 2016-07-05 NOTE — Discharge Instructions (Signed)
Denosumab injection °What is this medicine? °DENOSUMAB (den oh sue mab) slows bone breakdown. Prolia is used to treat osteoporosis in women after menopause and in men. Xgeva is used to treat a high calcium level due to cancer and to prevent bone fractures and other bone problems caused by multiple myeloma or cancer bone metastases. Xgeva is also used to treat giant cell tumor of the bone. °This medicine may be used for other purposes; ask your health care provider or pharmacist if you have questions. °COMMON BRAND NAME(S): Prolia, XGEVA °What should I tell my health care provider before I take this medicine? °They need to know if you have any of these conditions: °-dental disease °-having surgery or tooth extraction °-infection °-kidney disease °-low levels of calcium or Vitamin D in the blood °-malnutrition °-on hemodialysis °-skin conditions or sensitivity °-thyroid or parathyroid disease °-an unusual reaction to denosumab, other medicines, foods, dyes, or preservatives °-pregnant or trying to get pregnant °-breast-feeding °How should I use this medicine? °This medicine is for injection under the skin. It is given by a health care professional in a hospital or clinic setting. °If you are getting Prolia, a special MedGuide will be given to you by the pharmacist with each prescription and refill. Be sure to read this information carefully each time. °For Prolia, talk to your pediatrician regarding the use of this medicine in children. Special care may be needed. For Xgeva, talk to your pediatrician regarding the use of this medicine in children. While this drug may be prescribed for children as young as 13 years for selected conditions, precautions do apply. °Overdosage: If you think you have taken too much of this medicine contact a poison control center or emergency room at once. °NOTE: This medicine is only for you. Do not share this medicine with others. °What if I miss a dose? °It is important not to miss your  dose. Call your doctor or health care professional if you are unable to keep an appointment. °What may interact with this medicine? °Do not take this medicine with any of the following medications: °-other medicines containing denosumab °This medicine may also interact with the following medications: °-medicines that lower your chance of fighting infection °-steroid medicines like prednisone or cortisone °This list may not describe all possible interactions. Give your health care provider a list of all the medicines, herbs, non-prescription drugs, or dietary supplements you use. Also tell them if you smoke, drink alcohol, or use illegal drugs. Some items may interact with your medicine. °What should I watch for while using this medicine? °Visit your doctor or health care professional for regular checks on your progress. Your doctor or health care professional may order blood tests and other tests to see how you are doing. °Call your doctor or health care professional for advice if you get a fever, chills or sore throat, or other symptoms of a cold or flu. Do not treat yourself. This drug may decrease your body's ability to fight infection. Try to avoid being around people who are sick. °You should make sure you get enough calcium and vitamin D while you are taking this medicine, unless your doctor tells you not to. Discuss the foods you eat and the vitamins you take with your health care professional. °See your dentist regularly. Brush and floss your teeth as directed. Before you have any dental work done, tell your dentist you are receiving this medicine. °Do not become pregnant while taking this medicine or for 5 months after stopping   it. Talk with your doctor or health care professional about your birth control options while taking this medicine. Women should inform their doctor if they wish to become pregnant or think they might be pregnant. There is a potential for serious side effects to an unborn child. Talk  to your health care professional or pharmacist for more information. What side effects may I notice from receiving this medicine? Side effects that you should report to your doctor or health care professional as soon as possible: -allergic reactions like skin rash, itching or hives, swelling of the face, lips, or tongue -bone pain -breathing problems -dizziness -jaw pain, especially after dental work -redness, blistering, peeling of the skin -signs and symptoms of infection like fever or chills; cough; sore throat; pain or trouble passing urine -signs of low calcium like fast heartbeat, muscle cramps or muscle pain; pain, tingling, numbness in the hands or feet; seizures -unusual bleeding or bruising -unusually weak or tired Side effects that usually do not require medical attention (report to your doctor or health care professional if they continue or are bothersome): -constipation -diarrhea -headache -joint pain -loss of appetite -muscle pain -runny nose -tiredness -upset stomach This list may not describe all possible side effects. Call your doctor for medical advice about side effects. You may report side effects to FDA at 1-800-FDA-1088. Where should I keep my medicine? This medicine is only given in a clinic, doctor's office, or other health care setting and will not be stored at home. NOTE: This sheet is a summary. It may not cover all possible information. If you have questions about this medicine, talk to your doctor, pharmacist, or health care provider.  2018 Elsevier/Gold Standard (2016-01-24 19:17:21)

## 2016-07-05 NOTE — Progress Notes (Signed)
Pt given d/c instructions on Prolia.  Pt told by her MD not to take calcium.  Pt's calcium level is 9.6, which is WNL.  Pt states she does take daily vitamin D.  Informed pt to keep taking unless her MD instructs her otherwise.  Pt voices understanding.  Prolia 60mg  SQ given to left upper arm.  Pt d/c ambulatory with husband to lobby.

## 2016-09-19 ENCOUNTER — Ambulatory Visit (INDEPENDENT_AMBULATORY_CARE_PROVIDER_SITE_OTHER): Payer: Medicare Other | Admitting: *Deleted

## 2016-09-19 ENCOUNTER — Telehealth: Payer: Self-pay | Admitting: Cardiology

## 2016-09-19 DIAGNOSIS — I495 Sick sinus syndrome: Secondary | ICD-10-CM

## 2016-09-19 DIAGNOSIS — Z1231 Encounter for screening mammogram for malignant neoplasm of breast: Secondary | ICD-10-CM | POA: Diagnosis not present

## 2016-09-19 NOTE — Telephone Encounter (Signed)
LMOVM reminding pt to send remote transmission.   

## 2016-09-20 NOTE — Progress Notes (Signed)
Remote pacemaker transmission.   

## 2016-09-25 ENCOUNTER — Encounter: Payer: Self-pay | Admitting: Cardiology

## 2016-10-02 LAB — CUP PACEART REMOTE DEVICE CHECK
Battery Impedance: 231 Ohm
Brady Statistic AP VP Percent: 0 %
Brady Statistic AP VS Percent: 100 %
Brady Statistic AS VP Percent: 0 %
Brady Statistic AS VS Percent: 0 %
Implantable Lead Implant Date: 20050902
Implantable Lead Implant Date: 20060208
Implantable Lead Location: 753859
Implantable Lead Model: 4469
Implantable Lead Model: 4470
Implantable Lead Serial Number: 4470032319
Lead Channel Impedance Value: 348 Ohm
Lead Channel Impedance Value: 626 Ohm
Lead Channel Pacing Threshold Amplitude: 0.75 V
Lead Channel Pacing Threshold Amplitude: 0.75 V
Lead Channel Sensing Intrinsic Amplitude: 5.6 mV
MDC IDC LEAD LOCATION: 753860
MDC IDC LEAD SERIAL: 449276
MDC IDC MSMT BATTERY REMAINING LONGEVITY: 118 mo
MDC IDC MSMT BATTERY VOLTAGE: 2.79 V
MDC IDC MSMT LEADCHNL RA PACING THRESHOLD PULSEWIDTH: 0.4 ms
MDC IDC MSMT LEADCHNL RV PACING THRESHOLD PULSEWIDTH: 0.4 ms
MDC IDC PG IMPLANT DT: 20150126
MDC IDC SESS DTM: 20180905153252
MDC IDC SET LEADCHNL RA PACING AMPLITUDE: 2 V
MDC IDC SET LEADCHNL RV PACING AMPLITUDE: 2.5 V
MDC IDC SET LEADCHNL RV PACING PULSEWIDTH: 0.4 ms
MDC IDC SET LEADCHNL RV SENSING SENSITIVITY: 2.8 mV

## 2016-10-27 DIAGNOSIS — Z23 Encounter for immunization: Secondary | ICD-10-CM | POA: Diagnosis not present

## 2016-12-19 ENCOUNTER — Ambulatory Visit (INDEPENDENT_AMBULATORY_CARE_PROVIDER_SITE_OTHER): Payer: Medicare Other | Admitting: *Deleted

## 2016-12-19 DIAGNOSIS — I495 Sick sinus syndrome: Secondary | ICD-10-CM

## 2016-12-20 ENCOUNTER — Encounter: Payer: Self-pay | Admitting: Cardiology

## 2016-12-20 NOTE — Progress Notes (Signed)
Remote pacemaker transmission.   

## 2016-12-26 LAB — CUP PACEART REMOTE DEVICE CHECK
Brady Statistic AS VS Percent: 0 %
Date Time Interrogation Session: 20181205193953
Implantable Lead Location: 753859
Implantable Lead Location: 753860
Implantable Lead Model: 4470
Implantable Lead Serial Number: 4470032319
Implantable Pulse Generator Implant Date: 20150126
Lead Channel Impedance Value: 628 Ohm
Lead Channel Pacing Threshold Pulse Width: 0.4 ms
Lead Channel Setting Pacing Amplitude: 2.5 V
Lead Channel Setting Pacing Pulse Width: 0.4 ms
Lead Channel Setting Sensing Sensitivity: 2.8 mV
MDC IDC LEAD IMPLANT DT: 20050902
MDC IDC LEAD IMPLANT DT: 20060208
MDC IDC LEAD SERIAL: 449276
MDC IDC MSMT BATTERY IMPEDANCE: 231 Ohm
MDC IDC MSMT BATTERY REMAINING LONGEVITY: 119 mo
MDC IDC MSMT BATTERY VOLTAGE: 2.79 V
MDC IDC MSMT LEADCHNL RA PACING THRESHOLD AMPLITUDE: 0.75 V
MDC IDC MSMT LEADCHNL RA PACING THRESHOLD PULSEWIDTH: 0.4 ms
MDC IDC MSMT LEADCHNL RV IMPEDANCE VALUE: 352 Ohm
MDC IDC MSMT LEADCHNL RV PACING THRESHOLD AMPLITUDE: 0.75 V
MDC IDC SET LEADCHNL RA PACING AMPLITUDE: 2 V
MDC IDC STAT BRADY AP VP PERCENT: 0 %
MDC IDC STAT BRADY AP VS PERCENT: 100 %
MDC IDC STAT BRADY AS VP PERCENT: 0 %

## 2017-01-07 ENCOUNTER — Encounter (HOSPITAL_COMMUNITY): Payer: Self-pay

## 2017-01-07 ENCOUNTER — Ambulatory Visit (HOSPITAL_COMMUNITY)
Admission: RE | Admit: 2017-01-07 | Discharge: 2017-01-07 | Disposition: A | Discharge status: Home / Self Care | Payer: Medicare Other | Source: Ambulatory Visit | Attending: Internal Medicine | Admitting: Internal Medicine

## 2017-01-07 DIAGNOSIS — M810 Age-related osteoporosis without current pathological fracture: Secondary | ICD-10-CM | POA: Diagnosis not present

## 2017-01-07 MED ORDER — DENOSUMAB 60 MG/ML ~~LOC~~ SOLN
60.0000 mg | Freq: Once | SUBCUTANEOUS | Status: AC
  Administered 2017-01-07: 60 mg via SUBCUTANEOUS
  Filled 2017-01-07: qty 1

## 2017-01-07 NOTE — Progress Notes (Signed)
prolia  60mg  given to left upper arm, SQ.  D/c instructions on Prolia given to pt.  Pt was d/c ambulatory with cane to lobby.

## 2017-01-07 NOTE — Discharge Instructions (Signed)
Denosumab injection °What is this medicine? °DENOSUMAB (den oh sue mab) slows bone breakdown. Prolia is used to treat osteoporosis in women after menopause and in men. Xgeva is used to treat a high calcium level due to cancer and to prevent bone fractures and other bone problems caused by multiple myeloma or cancer bone metastases. Xgeva is also used to treat giant cell tumor of the bone. °This medicine may be used for other purposes; ask your health care provider or pharmacist if you have questions. °COMMON BRAND NAME(S): Prolia, XGEVA °What should I tell my health care provider before I take this medicine? °They need to know if you have any of these conditions: °-dental disease °-having surgery or tooth extraction °-infection °-kidney disease °-low levels of calcium or Vitamin D in the blood °-malnutrition °-on hemodialysis °-skin conditions or sensitivity °-thyroid or parathyroid disease °-an unusual reaction to denosumab, other medicines, foods, dyes, or preservatives °-pregnant or trying to get pregnant °-breast-feeding °How should I use this medicine? °This medicine is for injection under the skin. It is given by a health care professional in a hospital or clinic setting. °If you are getting Prolia, a special MedGuide will be given to you by the pharmacist with each prescription and refill. Be sure to read this information carefully each time. °For Prolia, talk to your pediatrician regarding the use of this medicine in children. Special care may be needed. For Xgeva, talk to your pediatrician regarding the use of this medicine in children. While this drug may be prescribed for children as young as 13 years for selected conditions, precautions do apply. °Overdosage: If you think you have taken too much of this medicine contact a poison control center or emergency room at once. °NOTE: This medicine is only for you. Do not share this medicine with others. °What if I miss a dose? °It is important not to miss your  dose. Call your doctor or health care professional if you are unable to keep an appointment. °What may interact with this medicine? °Do not take this medicine with any of the following medications: °-other medicines containing denosumab °This medicine may also interact with the following medications: °-medicines that lower your chance of fighting infection °-steroid medicines like prednisone or cortisone °This list may not describe all possible interactions. Give your health care provider a list of all the medicines, herbs, non-prescription drugs, or dietary supplements you use. Also tell them if you smoke, drink alcohol, or use illegal drugs. Some items may interact with your medicine. °What should I watch for while using this medicine? °Visit your doctor or health care professional for regular checks on your progress. Your doctor or health care professional may order blood tests and other tests to see how you are doing. °Call your doctor or health care professional for advice if you get a fever, chills or sore throat, or other symptoms of a cold or flu. Do not treat yourself. This drug may decrease your body's ability to fight infection. Try to avoid being around people who are sick. °You should make sure you get enough calcium and vitamin D while you are taking this medicine, unless your doctor tells you not to. Discuss the foods you eat and the vitamins you take with your health care professional. °See your dentist regularly. Brush and floss your teeth as directed. Before you have any dental work done, tell your dentist you are receiving this medicine. °Do not become pregnant while taking this medicine or for 5 months after stopping   it. Talk with your doctor or health care professional about your birth control options while taking this medicine. Women should inform their doctor if they wish to become pregnant or think they might be pregnant. There is a potential for serious side effects to an unborn child. Talk  to your health care professional or pharmacist for more information. What side effects may I notice from receiving this medicine? Side effects that you should report to your doctor or health care professional as soon as possible: -allergic reactions like skin rash, itching or hives, swelling of the face, lips, or tongue -bone pain -breathing problems -dizziness -jaw pain, especially after dental work -redness, blistering, peeling of the skin -signs and symptoms of infection like fever or chills; cough; sore throat; pain or trouble passing urine -signs of low calcium like fast heartbeat, muscle cramps or muscle pain; pain, tingling, numbness in the hands or feet; seizures -unusual bleeding or bruising -unusually weak or tired Side effects that usually do not require medical attention (report to your doctor or health care professional if they continue or are bothersome): -constipation -diarrhea -headache -joint pain -loss of appetite -muscle pain -runny nose -tiredness -upset stomach This list may not describe all possible side effects. Call your doctor for medical advice about side effects. You may report side effects to FDA at 1-800-FDA-1088. Where should I keep my medicine? This medicine is only given in a clinic, doctor's office, or other health care setting and will not be stored at home. NOTE: This sheet is a summary. It may not cover all possible information. If you have questions about this medicine, talk to your doctor, pharmacist, or health care provider.  2018 Elsevier/Gold Standard (2016-01-24 19:17:21)

## 2017-01-10 DIAGNOSIS — M810 Age-related osteoporosis without current pathological fracture: Secondary | ICD-10-CM | POA: Diagnosis not present

## 2017-01-10 DIAGNOSIS — E038 Other specified hypothyroidism: Secondary | ICD-10-CM | POA: Diagnosis not present

## 2017-01-10 DIAGNOSIS — E7849 Other hyperlipidemia: Secondary | ICD-10-CM | POA: Diagnosis not present

## 2017-01-10 DIAGNOSIS — R82998 Other abnormal findings in urine: Secondary | ICD-10-CM | POA: Diagnosis not present

## 2017-03-13 DIAGNOSIS — E038 Other specified hypothyroidism: Secondary | ICD-10-CM | POA: Diagnosis not present

## 2017-03-20 ENCOUNTER — Ambulatory Visit (INDEPENDENT_AMBULATORY_CARE_PROVIDER_SITE_OTHER): Payer: Medicare Other | Admitting: *Deleted

## 2017-03-20 ENCOUNTER — Telehealth: Payer: Self-pay | Admitting: Cardiology

## 2017-03-20 DIAGNOSIS — I495 Sick sinus syndrome: Secondary | ICD-10-CM

## 2017-03-20 NOTE — Telephone Encounter (Signed)
Spoke with pt and reminded pt of remote transmission that is due today. Pt verbalized understanding.   

## 2017-03-21 ENCOUNTER — Encounter: Payer: Self-pay | Admitting: Cardiology

## 2017-03-21 NOTE — Progress Notes (Signed)
Remote pacemaker transmission.   

## 2017-03-25 LAB — CUP PACEART REMOTE DEVICE CHECK
Battery Impedance: 232 Ohm
Brady Statistic AP VP Percent: 0 %
Brady Statistic AS VP Percent: 0 %
Brady Statistic AS VS Percent: 0 %
Implantable Lead Implant Date: 20050902
Implantable Lead Location: 753859
Implantable Lead Serial Number: 4470032319
Lead Channel Impedance Value: 361 Ohm
Lead Channel Impedance Value: 651 Ohm
Lead Channel Pacing Threshold Amplitude: 0.75 V
Lead Channel Pacing Threshold Amplitude: 0.75 V
Lead Channel Pacing Threshold Pulse Width: 0.4 ms
Lead Channel Setting Pacing Amplitude: 2 V
Lead Channel Setting Pacing Amplitude: 2.5 V
Lead Channel Setting Sensing Sensitivity: 2.8 mV
MDC IDC LEAD IMPLANT DT: 20060208
MDC IDC LEAD LOCATION: 753860
MDC IDC LEAD SERIAL: 449276
MDC IDC MSMT BATTERY REMAINING LONGEVITY: 119 mo
MDC IDC MSMT BATTERY VOLTAGE: 2.79 V
MDC IDC MSMT LEADCHNL RA PACING THRESHOLD PULSEWIDTH: 0.4 ms
MDC IDC PG IMPLANT DT: 20150126
MDC IDC SESS DTM: 20190306162600
MDC IDC SET LEADCHNL RV PACING PULSEWIDTH: 0.4 ms
MDC IDC STAT BRADY AP VS PERCENT: 100 %

## 2017-04-02 ENCOUNTER — Ambulatory Visit: Payer: Medicare Other | Admitting: Sports Medicine

## 2017-04-02 ENCOUNTER — Encounter: Payer: Self-pay | Admitting: Sports Medicine

## 2017-04-02 VITALS — BP 135/67 | HR 66 | Resp 16

## 2017-04-02 DIAGNOSIS — M79672 Pain in left foot: Secondary | ICD-10-CM

## 2017-04-02 DIAGNOSIS — Q828 Other specified congenital malformations of skin: Secondary | ICD-10-CM

## 2017-04-02 DIAGNOSIS — L989 Disorder of the skin and subcutaneous tissue, unspecified: Secondary | ICD-10-CM | POA: Diagnosis not present

## 2017-04-02 NOTE — Progress Notes (Signed)
Subjective: Robin Atkins is a 84 y.o. female patient who presents to office for evaluation of Left foot pain secondary to callus skin at heel. Patient complains of pain at the lesion present left heel. Patient has tried walking with shoes and padding with relief in symptoms. Can not walk barefoot due to pain. Patient denies any other pedal complaints.   Review of Systems  Skin:       Left heel skin lesion  All other systems reviewed and are negative.    Patient Active Problem List   Diagnosis Date Noted  . Chest pain 04/22/2014  . Left upper arm pain 04/22/2014  . Hyperlipidemia 02/05/2013  . Fracture, femur, shaft (HCC) 04/29/2012  . Hypothyroidism 04/29/2012  . Other and unspecified hyperlipidemia 04/29/2012  . SICK SINUS SYNDROME 02/24/2010    Current Outpatient Medications on File Prior to Visit  Medication Sig Dispense Refill  . aspirin 325 MG tablet Take 325 mg by mouth every 4 (four) hours as needed for mild pain, moderate pain, fever or headache.    Marland Kitchen. aspirin EC 81 MG tablet Take 81 mg by mouth daily.    . calcium carbonate (TUMS EX) 750 MG chewable tablet Chew 1 tablet by mouth 2 (two) times daily as needed for heartburn.     . Cholecalciferol (VITAMIN D) 2000 UNITS tablet Take 2,000 Units by mouth daily.    Marland Kitchen. levothyroxine (SYNTHROID, LEVOTHROID) 50 MCG tablet Take 1 tablet by mouth daily.  1  . Multiple Vitamins-Minerals (MULTIVITAMIN ADULT PO) Take 1 tablet by mouth daily.    . Omega-3 Fatty Acids (FISH OIL PO) Take 1 tablet by mouth daily.     Marland Kitchen. omeprazole (PRILOSEC) 20 MG capsule Take 20 mg by mouth as directed.     . pravastatin (PRAVACHOL) 20 MG tablet Take 20 mg by mouth at bedtime.     Marland Kitchen. UNABLE TO FIND OUTPATIENT PHYSICAL THERAPY   Diagnosis: Left shoulder glenohumeral osteoarthritis 1 Mutually Defined 0  . vitamin B-12 (CYANOCOBALAMIN) 1000 MCG tablet Take 1,000 mcg by mouth daily.     No current facility-administered medications on file prior to visit.      No Known Allergies  Objective:  General: Alert and oriented x3 in no acute distress  Dermatology: Keratotic lesion present x 3 at left plantar heel with skin lines transversing the lesion, pain is present with direct pressure to the lesion with a central nucleated core noted, no webspace macerations, no ecchymosis bilateral, all nails x 10 are well manicured.  Vascular: Dorsalis Pedis and Posterior Tibial pedal pulses 1/4, Capillary Fill Time 3 seconds, + pedal hair growth bilateral, no edema bilateral lower extremities, Temperature gradient within normal limits.  Neurology: Michaell CowingGross sensation intact via light touch bilateral.  Musculoskeletal: Mild tenderness with palpation at the keratotic lesion site on Left, Muscular strength 5/5 in all groups without pain or limitation on range of motion. No symptomatic lower extremity muscular or boney deformity noted.  Assessment and Plan: Problem List Items Addressed This Visit    None    Visit Diagnoses    Porokeratosis    -  Primary   Benign skin lesion       Pain of left heel         -Complete examination performed -Discussed treatment options -Parred keratoic lesions using a chisel blade; treated the area with Salinocaine covered with moleskin -Encouraged daily skin emollients -Encouraged use of pumice stone -Advised good supportive shoes and inserts -Patient to return to office as  needed or sooner if condition worsens.  Landis Martins, DPM

## 2017-04-05 DIAGNOSIS — H52223 Regular astigmatism, bilateral: Secondary | ICD-10-CM | POA: Diagnosis not present

## 2017-06-19 ENCOUNTER — Ambulatory Visit (INDEPENDENT_AMBULATORY_CARE_PROVIDER_SITE_OTHER): Payer: Medicare Other | Admitting: *Deleted

## 2017-06-19 ENCOUNTER — Telehealth: Payer: Self-pay | Admitting: Cardiology

## 2017-06-19 DIAGNOSIS — I10 Essential (primary) hypertension: Secondary | ICD-10-CM

## 2017-06-19 DIAGNOSIS — I495 Sick sinus syndrome: Secondary | ICD-10-CM | POA: Diagnosis not present

## 2017-06-19 NOTE — Telephone Encounter (Signed)
Spoke with pt and reminded pt of remote transmission that is due today. Pt verbalized understanding.   

## 2017-06-19 NOTE — Progress Notes (Signed)
Remote pacemaker transmission.   

## 2017-07-08 DIAGNOSIS — H5310 Unspecified subjective visual disturbances: Secondary | ICD-10-CM | POA: Diagnosis not present

## 2017-07-10 ENCOUNTER — Ambulatory Visit (HOSPITAL_COMMUNITY)
Admission: RE | Admit: 2017-07-10 | Discharge: 2017-07-10 | Disposition: A | Discharge status: Home / Self Care | Payer: Medicare Other | Source: Ambulatory Visit | Attending: Internal Medicine | Admitting: Internal Medicine

## 2017-07-10 ENCOUNTER — Encounter (HOSPITAL_COMMUNITY): Payer: Self-pay

## 2017-07-10 DIAGNOSIS — M810 Age-related osteoporosis without current pathological fracture: Secondary | ICD-10-CM | POA: Diagnosis not present

## 2017-07-10 MED ORDER — DENOSUMAB 60 MG/ML ~~LOC~~ SOSY
60.0000 mg | PREFILLED_SYRINGE | Freq: Once | SUBCUTANEOUS | Status: AC
  Administered 2017-07-10: 60 mg via SUBCUTANEOUS
  Filled 2017-07-10: qty 1

## 2017-07-10 NOTE — Discharge Instructions (Signed)
Call 911 for emergencies ° °Call MD for problems or questions.  ° ° ° ° °Prolia °Denosumab injection °What is this medicine? °DENOSUMAB (den oh sue mab) slows bone breakdown. Prolia is used to treat osteoporosis in women after menopause and in men. Xgeva is used to treat a high calcium level due to cancer and to prevent bone fractures and other bone problems caused by multiple myeloma or cancer bone metastases. Xgeva is also used to treat giant cell tumor of the bone. °This medicine may be used for other purposes; ask your health care provider or pharmacist if you have questions. °COMMON BRAND NAME(S): Prolia, XGEVA °What should I tell my health care provider before I take this medicine? °They need to know if you have any of these conditions: °-dental disease °-having surgery or tooth extraction °-infection °-kidney disease °-low levels of calcium or Vitamin D in the blood °-malnutrition °-on hemodialysis °-skin conditions or sensitivity °-thyroid or parathyroid disease °-an unusual reaction to denosumab, other medicines, foods, dyes, or preservatives °-pregnant or trying to get pregnant °-breast-feeding °How should I use this medicine? °This medicine is for injection under the skin. It is given by a health care professional in a hospital or clinic setting. °If you are getting Prolia, a special MedGuide will be given to you by the pharmacist with each prescription and refill. Be sure to read this information carefully each time. °For Prolia, talk to your pediatrician regarding the use of this medicine in children. Special care may be needed. For Xgeva, talk to your pediatrician regarding the use of this medicine in children. While this drug may be prescribed for children as young as 13 years for selected conditions, precautions do apply. °Overdosage: If you think you have taken too much of this medicine contact a poison control center or emergency room at once. °NOTE: This medicine is only for you. Do not share this  medicine with others. °What if I miss a dose? °It is important not to miss your dose. Call your doctor or health care professional if you are unable to keep an appointment. °What may interact with this medicine? °Do not take this medicine with any of the following medications: °-other medicines containing denosumab °This medicine may also interact with the following medications: °-medicines that lower your chance of fighting infection °-steroid medicines like prednisone or cortisone °This list may not describe all possible interactions. Give your health care provider a list of all the medicines, herbs, non-prescription drugs, or dietary supplements you use. Also tell them if you smoke, drink alcohol, or use illegal drugs. Some items may interact with your medicine. °What should I watch for while using this medicine? °Visit your doctor or health care professional for regular checks on your progress. Your doctor or health care professional may order blood tests and other tests to see how you are doing. °Call your doctor or health care professional for advice if you get a fever, chills or sore throat, or other symptoms of a cold or flu. Do not treat yourself. This drug may decrease your body's ability to fight infection. Try to avoid being around people who are sick. °You should make sure you get enough calcium and vitamin D while you are taking this medicine, unless your doctor tells you not to. Discuss the foods you eat and the vitamins you take with your health care professional. °See your dentist regularly. Brush and floss your teeth as directed. Before you have any dental work done, tell your dentist you are   medicine. Do not become pregnant while taking this medicine or for 5 months after stopping it. Talk with your doctor or health care professional about your birth control options while taking this medicine. Women should inform their doctor if they wish to become pregnant or think they might be  pregnant. There is a potential for serious side effects to an unborn child. Talk to your health care professional or pharmacist for more information. What side effects may I notice from receiving this medicine? Side effects that you should report to your doctor or health care professional as soon as possible: -allergic reactions like skin rash, itching or hives, swelling of the face, lips, or tongue -bone pain -breathing problems -dizziness -jaw pain, especially after dental work -redness, blistering, peeling of the skin -signs and symptoms of infection like fever or chills; cough; sore throat; pain or trouble passing urine -signs of low calcium like fast heartbeat, muscle cramps or muscle pain; pain, tingling, numbness in the hands or feet; seizures -unusual bleeding or bruising -unusually weak or tired Side effects that usually do not require medical attention (report to your doctor or health care professional if they continue or are bothersome): -constipation -diarrhea -headache -joint pain -loss of appetite -muscle pain -runny nose -tiredness -upset stomach This list may not describe all possible side effects. Call your doctor for medical advice about side effects. You may report side effects to FDA at 1-800-FDA-1088. Where should I keep my medicine? This medicine is only given in a clinic, doctor's office, or other health care setting and will not be stored at home. NOTE: This sheet is a summary. It may not cover all possible information. If you have questions about this medicine, talk to your doctor, pharmacist, or health care provider.  2018 Elsevier/Gold Standard (2016-01-24 19:17:21)

## 2017-07-15 ENCOUNTER — Ambulatory Visit: Payer: Medicare Other | Admitting: Internal Medicine

## 2017-07-15 ENCOUNTER — Encounter: Payer: Self-pay | Admitting: Internal Medicine

## 2017-07-15 VITALS — BP 132/74 | HR 82 | Ht 61.0 in | Wt 124.0 lb

## 2017-07-15 DIAGNOSIS — I495 Sick sinus syndrome: Secondary | ICD-10-CM | POA: Diagnosis not present

## 2017-07-15 DIAGNOSIS — I10 Essential (primary) hypertension: Secondary | ICD-10-CM

## 2017-07-15 NOTE — Patient Instructions (Signed)
Medication Instructions:  Your physician recommends that you continue on your current medications as directed. Please refer to the Current Medication list given to you today.  *If you need a refill on your cardiac medications before your next appointment, please call your pharmacy*  Labwork: None ordered  Testing/Procedures: None ordered  Follow-Up: Remote monitoring is used to monitor your Pacemaker or ICD from home. This monitoring reduces the number of office visits required to check your device to one time per year. It allows us to keep an eye on the functioning of your device to ensure it is working properly. You are scheduled for a device check from home on 09/18/2017. You may send your transmission at any time that day. If you have a wireless device, the transmission will be sent automatically. After your physician reviews your transmission, you will receive a postcard with your next transmission date.  Your physician wants you to follow-up in: 1 year with Francis Dowseenee Ursuy, PA.  You will receive a reminder letter in the mail two months in advance. If you don't receive a letter, please call our office to schedule the follow-up appointment.  Thank you for choosing [facility]!!

## 2017-07-15 NOTE — Progress Notes (Signed)
PCP: Rodrigo Ran, MD   Primary EP:  Dr Mckinley Jewel Robin Atkins is a 84 y.o. female who presents today for routine electrophysiology followup.  Since last being seen in our clinic, the patient reports doing very well.  Today, she denies symptoms of palpitations, chest pain, shortness of breath,  lower extremity edema, dizziness, presyncope, or syncope.  The patient is otherwise without complaint today.   Past Medical History:  Diagnosis Date  . Benign recurrent vertigo   . Broken foot    Broken right foot and broken left knee in 2011  . Cataracts, bilateral   . DDD (degenerative disc disease), lumbar   . Diverticulosis   . GERD (gastroesophageal reflux disease)   . Hemorrhoids   . Hx of migraine headaches   . Hyperlipidemia   . Hypothyroidism   . Knee cap dislocation 2011   left  . Lumbar radiculopathy   . MVP (mitral valve prolapse)   . OP (osteoporosis)   . S/P cardiac pacemaker procedure    Original implant 1998 with generator change in 2005. She has had prior lead revision.   . Shingles   . SSS (sick sinus syndrome) (HCC)    s/p PTVP  . Wrist fracture    lt   Past Surgical History:  Procedure Laterality Date  . CARDIOVASCULAR STRESS TEST  07/10/2005   EF 78%, NO ISCHEMIA  . HEMORROIDECTOMY    . ORIF FEMUR FRACTURE Right 04/29/2012   Procedure: OPEN REDUCTION INTERNAL FIXATION (ORIF) DISTAL FEMUR FRACTURE;  Surgeon: Eulas Post, MD;  Location: [facility] OR;  Service: Orthopedics;  Laterality: Right;  biomet long condylar plate, flat jackson, cerclage wires, big c arm  . PACEMAKER GENERATOR CHANGE  02/09/13   MDT Adapta L gen change by Dr [name]  . PACEMAKER GENERATOR CHANGE N/A 02/09/2013   Procedure: PACEMAKER GENERATOR CHANGE;  Surgeon: Gardiner Rhyme, MD;  Location: [facility] CATH LAB;  Service: Cardiovascular;  Laterality: N/A;  . PACEMAKER INSERTION  1998/2005   TRANSVENOUS PACEMAKER  . TOTAL HIP ARTHROPLASTY     right  . US ECHOCARDIOGRAPHY  11/25/2008   EF 55-60%;  mild to moderate AI    ROS- all systems are reviewed and negative except as per HPI above  Current Outpatient Medications  Medication Sig Dispense Refill  . Cholecalciferol (VITAMIN D) 2000 UNITS tablet Take 2,000 Units by mouth daily.    Marland Kitchen levothyroxine (SYNTHROID, LEVOTHROID) 50 MCG tablet Take 1 tablet by mouth daily.  1  . Multiple Vitamins-Minerals (MULTIVITAMIN ADULT PO) Take 1 tablet by mouth daily.    . Omega-3 Fatty Acids (FISH OIL PO) Take 1 tablet by mouth daily.     . pravastatin (PRAVACHOL) 20 MG tablet Take 20 mg by mouth at bedtime.     Marland Kitchen UNABLE TO FIND OUTPATIENT PHYSICAL THERAPY   Diagnosis: Left shoulder glenohumeral osteoarthritis 1 Mutually Defined 0  . vitamin B-12 (CYANOCOBALAMIN) 1000 MCG tablet Take 1,000 mcg by mouth daily.     No current facility-administered medications for this visit.     Physical Exam: Vitals:   07/15/17 1354  BP: 132/74  Pulse: 82  Weight: 124 lb (56.2 kg)  Height: 5\' 1"  (1.549 m)    GEN- The patient is well appearing, alert and oriented x 3 today.   Head- normocephalic, atraumatic Eyes-  Sclera clear, conjunctiva pink Ears- hearing intact Oropharynx- clear Lungs- Clear to ausculation bilaterally, normal work of breathing Chest- pacemaker pocket is well healed Heart- Regular rate  and rhythm, no murmurs, rubs or gallops, PMI not laterally displaced GI- soft, NT, ND, + BS Extremities- no clubbing, cyanosis, or edema  Pacemaker interrogation- reviewed in detail today,  See PACEART report  ekg tracing ordered today is personally reviewed and shows atrial paced rhythm, nonspecific St/T changes  Assessment and Plan:  1. Symptomatic sinus bradycardia  Normal pacemaker function See Pace Art report No changes today  2. HTN Stable No change required today  [facility] Return to see EP PA in a year  Hillis RangeJames Nyron Mozer MD, Williamson Medical CenterFACC 07/15/2017 2:12 PM

## 2017-07-16 LAB — CUP PACEART INCLINIC DEVICE CHECK
Brady Statistic AP VP Percent: 0 %
Brady Statistic AP VS Percent: 100 %
Brady Statistic AS VP Percent: 0 %
Brady Statistic AS VS Percent: 0 %
Implantable Lead Implant Date: 20060208
Implantable Lead Location: 753859
Implantable Lead Model: 4469
Lead Channel Impedance Value: 350 Ohm
Lead Channel Pacing Threshold Amplitude: 0.5 V
Lead Channel Pacing Threshold Amplitude: 1 V
Lead Channel Pacing Threshold Pulse Width: 0.4 ms
Lead Channel Setting Pacing Amplitude: 2 V
Lead Channel Setting Pacing Pulse Width: 0.4 ms
MDC IDC LEAD IMPLANT DT: 20050902
MDC IDC LEAD LOCATION: 753860
MDC IDC LEAD SERIAL: 4470032319
MDC IDC LEAD SERIAL: 449276
MDC IDC MSMT BATTERY IMPEDANCE: 280 Ohm
MDC IDC MSMT BATTERY REMAINING LONGEVITY: 113 mo
MDC IDC MSMT BATTERY VOLTAGE: 2.79 V
MDC IDC MSMT LEADCHNL RA IMPEDANCE VALUE: 672 Ohm
MDC IDC MSMT LEADCHNL RA PACING THRESHOLD AMPLITUDE: 0.75 V
MDC IDC MSMT LEADCHNL RA PACING THRESHOLD PULSEWIDTH: 0.4 ms
MDC IDC MSMT LEADCHNL RA PACING THRESHOLD PULSEWIDTH: 0.4 ms
MDC IDC MSMT LEADCHNL RV PACING THRESHOLD AMPLITUDE: 0.75 V
MDC IDC MSMT LEADCHNL RV PACING THRESHOLD PULSEWIDTH: 0.4 ms
MDC IDC MSMT LEADCHNL RV SENSING INTR AMPL: 5.6 mV
MDC IDC PG IMPLANT DT: 20150126
MDC IDC SESS DTM: 20190701165838
MDC IDC SET LEADCHNL RV PACING AMPLITUDE: 2.5 V
MDC IDC SET LEADCHNL RV SENSING SENSITIVITY: 2 mV

## 2017-07-29 DIAGNOSIS — M79674 Pain in right toe(s): Secondary | ICD-10-CM | POA: Diagnosis not present

## 2017-07-29 DIAGNOSIS — Z6821 Body mass index (BMI) 21.0-21.9, adult: Secondary | ICD-10-CM | POA: Diagnosis not present

## 2017-08-05 ENCOUNTER — Ambulatory Visit (HOSPITAL_COMMUNITY)
Admission: RE | Admit: 2017-08-05 | Discharge: 2017-08-05 | Disposition: A | Discharge status: Home / Self Care | Payer: Medicare Other | Source: Ambulatory Visit | Attending: Family | Admitting: Family

## 2017-08-05 ENCOUNTER — Other Ambulatory Visit (HOSPITAL_COMMUNITY): Payer: Self-pay | Admitting: Internal Medicine

## 2017-08-05 DIAGNOSIS — I739 Peripheral vascular disease, unspecified: Secondary | ICD-10-CM

## 2017-08-05 DIAGNOSIS — M79674 Pain in right toe(s): Secondary | ICD-10-CM | POA: Insufficient documentation

## 2017-08-05 DIAGNOSIS — E785 Hyperlipidemia, unspecified: Secondary | ICD-10-CM | POA: Diagnosis not present

## 2017-08-07 LAB — CUP PACEART REMOTE DEVICE CHECK
Battery Impedance: 256 Ohm
Battery Remaining Longevity: 116 mo
Battery Voltage: 2.79 V
Brady Statistic AS VS Percent: 0 %
Date Time Interrogation Session: 20190605170119
Implantable Lead Implant Date: 20050902
Implantable Lead Location: 753860
Implantable Lead Model: 4470
Implantable Lead Serial Number: 4470032319
Lead Channel Impedance Value: 347 Ohm
Lead Channel Pacing Threshold Pulse Width: 0.4 ms
Lead Channel Setting Pacing Amplitude: 2.5 V
Lead Channel Setting Pacing Pulse Width: 0.4 ms
Lead Channel Setting Sensing Sensitivity: 2 mV
MDC IDC LEAD IMPLANT DT: 20060208
MDC IDC LEAD LOCATION: 753859
MDC IDC LEAD SERIAL: 449276
MDC IDC MSMT LEADCHNL RA IMPEDANCE VALUE: 648 Ohm
MDC IDC MSMT LEADCHNL RA PACING THRESHOLD AMPLITUDE: 0.75 V
MDC IDC MSMT LEADCHNL RV PACING THRESHOLD AMPLITUDE: 0.875 V
MDC IDC MSMT LEADCHNL RV PACING THRESHOLD PULSEWIDTH: 0.4 ms
MDC IDC PG IMPLANT DT: 20150126
MDC IDC SET LEADCHNL RA PACING AMPLITUDE: 2 V
MDC IDC STAT BRADY AP VP PERCENT: 0 %
MDC IDC STAT BRADY AP VS PERCENT: 100 %
MDC IDC STAT BRADY AS VP PERCENT: 0 %

## 2017-08-13 ENCOUNTER — Encounter: Payer: Self-pay | Admitting: Sports Medicine

## 2017-08-13 ENCOUNTER — Ambulatory Visit: Payer: Medicare Other | Admitting: Sports Medicine

## 2017-08-13 VITALS — BP 152/78 | HR 65

## 2017-08-13 DIAGNOSIS — L601 Onycholysis: Secondary | ICD-10-CM

## 2017-08-13 DIAGNOSIS — M79674 Pain in right toe(s): Secondary | ICD-10-CM | POA: Diagnosis not present

## 2017-08-13 DIAGNOSIS — Q828 Other specified congenital malformations of skin: Secondary | ICD-10-CM | POA: Diagnosis not present

## 2017-08-13 NOTE — Patient Instructions (Signed)
[employer] Healthy feet for dry callus skin can be purchased over the counter

## 2017-08-13 NOTE — Progress Notes (Signed)
  Subjective: Robin Atkins is a 84 y.o. female patient seen today in office with complaint of mildly painful thickened and elongated toenail and lifting 1st toenail on right; unable to trim. Patient admits that she saw her PCP 3 weeks ago because toe was red; had circulation tests that were good. Admits to callus at 1st toes R>L. Patient has no other pedal complaints at this time.   Patient Active Problem List   Diagnosis Date Noted  . Chest pain 04/22/2014  . Left upper arm pain 04/22/2014  . Hyperlipidemia 02/05/2013  . Fracture, femur, shaft (HCC) 04/29/2012  . Hypothyroidism 04/29/2012  . Other and unspecified hyperlipidemia 04/29/2012  . SICK SINUS SYNDROME 02/24/2010    Current Outpatient Medications on File Prior to Visit  Medication Sig Dispense Refill  . Cholecalciferol (VITAMIN D) 2000 UNITS tablet Take 2,000 Units by mouth daily.    Marland Kitchen. levothyroxine (SYNTHROID, LEVOTHROID) 50 MCG tablet Take 1 tablet by mouth daily.  1  . Multiple Vitamins-Minerals (MULTIVITAMIN ADULT PO) Take 1 tablet by mouth daily.    . mupirocin ointment (BACTROBAN) 2 % APPLY THREE TIMES A DAY TO AFFECTED AREA  3  . Omega-3 Fatty Acids (FISH OIL PO) Take 1 tablet by mouth daily.     . pravastatin (PRAVACHOL) 20 MG tablet Take 20 mg by mouth at bedtime.     Marland Kitchen. UNABLE TO FIND OUTPATIENT PHYSICAL THERAPY   Diagnosis: Left shoulder glenohumeral osteoarthritis 1 Mutually Defined 0  . vitamin B-12 (CYANOCOBALAMIN) 1000 MCG tablet Take 1,000 mcg by mouth daily.     No current facility-administered medications on file prior to visit.     No Known Allergies  Objective: Physical Exam  General: Well developed, nourished, no acute distress, awake, alert and oriented x 3  Vascular: Dorsalis pedis artery 1/4 bilateral, Posterior tibial artery 1/4 bilateral, skin temperature warm to warm proximal to distal bilateral lower extremities, + varicosities, scant pedal hair present bilateral.  Neurological: Gross  sensation present via light touch bilateral.   Dermatological: Skin is warm, dry, and supple bilateral, Right great toenail is partially attached and is long, thick, and discolored with mild subungal debris and dry heme, no webspace macerations present bilateral, no open lesions present bilateral, + callushyperkeratotic tissue present medial 1st toe on right>left. No signs of infection bilateral.  Musculoskeletal:  Asymptomatic hammertoe boney deformities noted bilateral. Muscular strength within normal limits without pain on range of motion. No pain with calf compression bilateral.  Assessment and Plan:  Problem List Items Addressed This Visit    None    Visit Diagnoses    Onycholysis of toenail    -  Primary   Porokeratosis       Toe pain, right          -Examined patient.  -Discussed treatment options for painful mycotic nail and callus to toes -Mechanically debrided and reduced mycotic right 1st toenail with sterile nail nipper and dremel nail file without incident. -Advised Okeeffe healthy feet and pomice stone for callus skin -Patient to return as needed or sooner if symptoms worsen.  Asencion Islamitorya Jehu Mccauslin, DPM

## 2017-08-28 ENCOUNTER — Encounter (HOSPITAL_COMMUNITY): Payer: Medicare Other

## 2017-09-18 ENCOUNTER — Ambulatory Visit (INDEPENDENT_AMBULATORY_CARE_PROVIDER_SITE_OTHER): Payer: Medicare Other | Admitting: *Deleted

## 2017-09-18 ENCOUNTER — Telehealth: Payer: Self-pay | Admitting: Cardiology

## 2017-09-18 DIAGNOSIS — Z961 Presence of intraocular lens: Secondary | ICD-10-CM | POA: Diagnosis not present

## 2017-09-18 DIAGNOSIS — H5310 Unspecified subjective visual disturbances: Secondary | ICD-10-CM | POA: Diagnosis not present

## 2017-09-18 DIAGNOSIS — H43813 Vitreous degeneration, bilateral: Secondary | ICD-10-CM | POA: Diagnosis not present

## 2017-09-18 DIAGNOSIS — G43909 Migraine, unspecified, not intractable, without status migrainosus: Secondary | ICD-10-CM | POA: Diagnosis not present

## 2017-09-18 DIAGNOSIS — I495 Sick sinus syndrome: Secondary | ICD-10-CM

## 2017-09-18 NOTE — Telephone Encounter (Signed)
Spoke with pt and reminded pt of remote transmission that is due today. Pt verbalized understanding.   

## 2017-09-19 NOTE — Progress Notes (Signed)
Remote pacemaker transmission.   

## 2017-09-25 DIAGNOSIS — Z1231 Encounter for screening mammogram for malignant neoplasm of breast: Secondary | ICD-10-CM | POA: Diagnosis not present

## 2017-10-10 LAB — CUP PACEART REMOTE DEVICE CHECK
Battery Impedance: 304 Ohm
Brady Statistic AP VP Percent: 0 %
Brady Statistic AS VP Percent: 0 %
Brady Statistic AS VS Percent: 0 %
Implantable Lead Implant Date: 20050902
Implantable Lead Location: 753860
Implantable Lead Model: 4469
Implantable Lead Model: 4470
Implantable Lead Serial Number: 449276
Implantable Pulse Generator Implant Date: 20150126
Lead Channel Impedance Value: 358 Ohm
Lead Channel Impedance Value: 637 Ohm
Lead Channel Pacing Threshold Amplitude: 0.75 V
Lead Channel Pacing Threshold Amplitude: 0.75 V
Lead Channel Setting Pacing Amplitude: 2.5 V
MDC IDC LEAD IMPLANT DT: 20060208
MDC IDC LEAD LOCATION: 753859
MDC IDC LEAD SERIAL: 4470032319
MDC IDC MSMT BATTERY REMAINING LONGEVITY: 109 mo
MDC IDC MSMT BATTERY VOLTAGE: 2.79 V
MDC IDC MSMT LEADCHNL RA PACING THRESHOLD PULSEWIDTH: 0.4 ms
MDC IDC MSMT LEADCHNL RV PACING THRESHOLD PULSEWIDTH: 0.4 ms
MDC IDC SESS DTM: 20190904181453
MDC IDC SET LEADCHNL RA PACING AMPLITUDE: 2 V
MDC IDC SET LEADCHNL RV PACING PULSEWIDTH: 0.4 ms
MDC IDC SET LEADCHNL RV SENSING SENSITIVITY: 2.8 mV
MDC IDC STAT BRADY AP VS PERCENT: 100 %

## 2017-10-19 DIAGNOSIS — Z23 Encounter for immunization: Secondary | ICD-10-CM | POA: Diagnosis not present

## 2017-11-05 ENCOUNTER — Ambulatory Visit: Payer: Medicare Other | Admitting: Sports Medicine

## 2017-11-05 ENCOUNTER — Encounter: Payer: Self-pay | Admitting: Sports Medicine

## 2017-11-05 DIAGNOSIS — M79674 Pain in right toe(s): Secondary | ICD-10-CM | POA: Diagnosis not present

## 2017-11-05 DIAGNOSIS — L601 Onycholysis: Secondary | ICD-10-CM | POA: Diagnosis not present

## 2017-11-05 NOTE — Progress Notes (Signed)
Subjective: Robin Atkins is a 84 y.o. female patient seen today in office with complaint of mildly painful thickened and elongated toenail and lifting 1st toenail on right; unable to trim. Patient states that she is return to office to see what more can be done about her right great toe states that she does not like how it appears and reports that she has been hiding it however during the summer she has been wearing open sandals and trying to keep any excessive pressure off the toe.  Patient denies any constitutional symptoms. Patient has no other pedal complaints at this time.   Patient Active Problem List   Diagnosis Date Noted  . Chest pain 04/22/2014  . Left upper arm pain 04/22/2014  . Hyperlipidemia 02/05/2013  . Fracture, femur, shaft (HCC) 04/29/2012  . Hypothyroidism 04/29/2012  . Other and unspecified hyperlipidemia 04/29/2012  . SICK SINUS SYNDROME 02/24/2010    Current Outpatient Medications on File Prior to Visit  Medication Sig Dispense Refill  . Cholecalciferol (VITAMIN D) 2000 UNITS tablet Take 2,000 Units by mouth daily.    Marland Kitchen levothyroxine (SYNTHROID, LEVOTHROID) 50 MCG tablet Take 1 tablet by mouth daily.  1  . Multiple Vitamins-Minerals (MULTIVITAMIN ADULT PO) Take 1 tablet by mouth daily.    . mupirocin ointment (BACTROBAN) 2 % APPLY THREE TIMES A DAY TO AFFECTED AREA  3  . Omega-3 Fatty Acids (FISH OIL PO) Take 1 tablet by mouth daily.     . pravastatin (PRAVACHOL) 20 MG tablet Take 20 mg by mouth at bedtime.     Marland Kitchen UNABLE TO FIND OUTPATIENT PHYSICAL THERAPY   Diagnosis: Left shoulder glenohumeral osteoarthritis 1 Mutually Defined 0  . vitamin B-12 (CYANOCOBALAMIN) 1000 MCG tablet Take 1,000 mcg by mouth daily.     No current facility-administered medications on file prior to visit.     No Known Allergies  Objective: Physical Exam  General: Well developed, nourished, no acute distress, awake, alert and oriented x 3  Vascular: Dorsalis pedis artery 1/4  bilateral, Posterior tibial artery 1/4 bilateral, skin temperature warm to warm proximal to distal bilateral lower extremities, + varicosities, scant pedal hair present bilateral.  Neurological: Gross sensation present via light touch bilateral.   Dermatological: Skin is warm, dry, and supple bilateral, Right great toenail is partially attached and is long, thick, and discolored with mild subungal debris consistent with onycholysis, no webspace macerations present bilateral, no open lesions present bilateral, + minimal callushyperkeratotic tissue present medial 1st toe on right>left. No signs of infection bilateral.  Musculoskeletal:  Asymptomatic limited range of motion bilateral hallux with hammertoe boney deformities noted bilateral. Muscular strength within normal limits without pain on range of motion. No pain with calf compression bilateral.  Assessment and Plan:  Problem List Items Addressed This Visit    None    Visit Diagnoses    Onycholysis of toenail    -  Primary   Toe pain, right          -Examined patient.  -Discussed treatment options for painful mycotic nail and callus to toes -Mechanically debrided and reduced mycotic right 1st toenail with sterile nail nipper and dremel nail file without incident. -Advised patient that she may try over-the-counter topical nail lacquer or Vicks VapoRub to help thin out any thickened portions of the nail.  Advised patient that this will take time in consistency upwards to 1 year and that there is still no guarantee her toenail may continue to grow out thickened and discolored and  may split due to history of fall/trauma to the nail. -Advised daily skin emollients for callus skin at first toe and advised patient that she will always have calluses because of the way that she walks and stands and arthritis that is present in her toes that decreased motion -Patient to return as needed or sooner if symptoms worsen.  Asencion Islam, DPM

## 2017-12-18 ENCOUNTER — Telehealth: Payer: Self-pay

## 2017-12-18 ENCOUNTER — Ambulatory Visit (INDEPENDENT_AMBULATORY_CARE_PROVIDER_SITE_OTHER): Payer: Medicare Other

## 2017-12-18 DIAGNOSIS — I495 Sick sinus syndrome: Secondary | ICD-10-CM

## 2017-12-18 NOTE — Telephone Encounter (Signed)
LMOVM reminding pt to send remote transmission.   

## 2017-12-19 NOTE — Progress Notes (Signed)
Remote pacemaker transmission.   

## 2017-12-24 ENCOUNTER — Encounter: Payer: Self-pay | Admitting: Cardiology

## 2018-01-16 ENCOUNTER — Ambulatory Visit (HOSPITAL_COMMUNITY)
Admission: RE | Admit: 2018-01-16 | Discharge: 2018-01-16 | Disposition: A | Discharge status: Home / Self Care | Payer: Medicare Other | Source: Ambulatory Visit | Attending: Internal Medicine | Admitting: Internal Medicine

## 2018-01-16 ENCOUNTER — Encounter (HOSPITAL_COMMUNITY): Payer: Self-pay

## 2018-01-16 DIAGNOSIS — M810 Age-related osteoporosis without current pathological fracture: Secondary | ICD-10-CM | POA: Diagnosis not present

## 2018-01-16 MED ORDER — DENOSUMAB 60 MG/ML ~~LOC~~ SOSY
60.0000 mg | PREFILLED_SYRINGE | Freq: Once | SUBCUTANEOUS | Status: AC
  Administered 2018-01-16: 60 mg via SUBCUTANEOUS
  Filled 2018-01-16: qty 1

## 2018-01-16 NOTE — Discharge Instructions (Signed)
Denosumab injection °What is this medicine? °DENOSUMAB (den oh sue mab) slows bone breakdown. Prolia is used to treat osteoporosis in women after menopause and in men, and in people who are taking corticosteroids for 6 months or more. Xgeva is used to treat a high calcium level due to cancer and to prevent bone fractures and other bone problems caused by multiple myeloma or cancer bone metastases. Xgeva is also used to treat giant cell tumor of the bone. °This medicine may be used for other purposes; ask your health care provider or pharmacist if you have questions. °COMMON BRAND NAME(S): Prolia, XGEVA °What should I tell my health care provider before I take this medicine? °They need to know if you have any of these conditions: °-dental disease °-having surgery or tooth extraction °-infection °-kidney disease °-low levels of calcium or Vitamin D in the blood °-malnutrition °-on hemodialysis °-skin conditions or sensitivity °-thyroid or parathyroid disease °-an unusual reaction to denosumab, other medicines, foods, dyes, or preservatives °-pregnant or trying to get pregnant °-breast-feeding °How should I use this medicine? °This medicine is for injection under the skin. It is given by a health care professional in a hospital or clinic setting. °A special MedGuide will be given to you before each treatment. Be sure to read this information carefully each time. °For Prolia, talk to your pediatrician regarding the use of this medicine in children. Special care may be needed. For Xgeva, talk to your pediatrician regarding the use of this medicine in children. While this drug may be prescribed for children as young as 13 years for selected conditions, precautions do apply. °Overdosage: If you think you have taken too much of this medicine contact a poison control center or emergency room at once. °NOTE: This medicine is only for you. Do not share this medicine with others. °What if I miss a dose? °It is important not to  miss your dose. Call your doctor or health care professional if you are unable to keep an appointment. °What may interact with this medicine? °Do not take this medicine with any of the following medications: °-other medicines containing denosumab °This medicine may also interact with the following medications: °-medicines that lower your chance of fighting infection °-steroid medicines like prednisone or cortisone °This list may not describe all possible interactions. Give your health care provider a list of all the medicines, herbs, non-prescription drugs, or dietary supplements you use. Also tell them if you smoke, drink alcohol, or use illegal drugs. Some items may interact with your medicine. °What should I watch for while using this medicine? °Visit your doctor or health care professional for regular checks on your progress. Your doctor or health care professional may order blood tests and other tests to see how you are doing. °Call your doctor or health care professional for advice if you get a fever, chills or sore throat, or other symptoms of a cold or flu. Do not treat yourself. This drug may decrease your body's ability to fight infection. Try to avoid being around people who are sick. °You should make sure you get enough calcium and vitamin D while you are taking this medicine, unless your doctor tells you not to. Discuss the foods you eat and the vitamins you take with your health care professional. °See your dentist regularly. Brush and floss your teeth as directed. Before you have any dental work done, tell your dentist you are receiving this medicine. °Do not become pregnant while taking this medicine or for 5 months   after stopping it. Talk with your doctor or health care professional about your birth control options while taking this medicine. Women should inform their doctor if they wish to become pregnant or think they might be pregnant. There is a potential for serious side effects to an unborn  child. Talk to your health care professional or pharmacist for more information. °What side effects may I notice from receiving this medicine? °Side effects that you should report to your doctor or health care professional as soon as possible: °-allergic reactions like skin rash, itching or hives, swelling of the face, lips, or tongue °-bone pain °-breathing problems °-dizziness °-jaw pain, especially after dental work °-redness, blistering, peeling of the skin °-signs and symptoms of infection like fever or chills; cough; sore throat; pain or trouble passing urine °-signs of low calcium like fast heartbeat, muscle cramps or muscle pain; pain, tingling, numbness in the hands or feet; seizures °-unusual bleeding or bruising °-unusually weak or tired °Side effects that usually do not require medical attention (report to your doctor or health care professional if they continue or are bothersome): °-constipation °-diarrhea °-headache °-joint pain °-loss of appetite °-muscle pain °-runny nose °-tiredness °-upset stomach °This list may not describe all possible side effects. Call your doctor for medical advice about side effects. You may report side effects to FDA at 1-800-FDA-1088. °Where should I keep my medicine? °This medicine is only given in a clinic, doctor's office, or other health care setting and will not be stored at home. °NOTE: This sheet is a summary. It may not cover all possible information. If you have questions about this medicine, talk to your doctor, pharmacist, or health care provider. °© 2019 [employer] (2017-05-10 16:10:44) ° °

## 2018-01-19 LAB — CUP PACEART REMOTE DEVICE CHECK
Battery Remaining Longevity: 106 mo
Brady Statistic AP VP Percent: 0 %
Brady Statistic AS VP Percent: 0 %
Brady Statistic AS VS Percent: 0 %
Implantable Lead Implant Date: 20050902
Implantable Lead Implant Date: 20060208
Implantable Lead Location: 753859
Implantable Lead Model: 4469
Implantable Lead Serial Number: 449276
Lead Channel Impedance Value: 626 Ohm
Lead Channel Pacing Threshold Amplitude: 0.625 V
Lead Channel Pacing Threshold Amplitude: 0.75 V
Lead Channel Pacing Threshold Pulse Width: 0.4 ms
Lead Channel Setting Pacing Amplitude: 2 V
Lead Channel Setting Sensing Sensitivity: 2 mV
MDC IDC LEAD LOCATION: 753860
MDC IDC LEAD SERIAL: 4470032319
MDC IDC MSMT BATTERY IMPEDANCE: 329 Ohm
MDC IDC MSMT BATTERY VOLTAGE: 2.79 V
MDC IDC MSMT LEADCHNL RV IMPEDANCE VALUE: 359 Ohm
MDC IDC MSMT LEADCHNL RV PACING THRESHOLD PULSEWIDTH: 0.4 ms
MDC IDC PG IMPLANT DT: 20150126
MDC IDC SESS DTM: 20191204183043
MDC IDC SET LEADCHNL RV PACING AMPLITUDE: 2.5 V
MDC IDC SET LEADCHNL RV PACING PULSEWIDTH: 0.4 ms
MDC IDC STAT BRADY AP VS PERCENT: 100 %

## 2018-01-27 DIAGNOSIS — M545 Low back pain: Secondary | ICD-10-CM | POA: Diagnosis not present

## 2018-01-27 DIAGNOSIS — M25551 Pain in right hip: Secondary | ICD-10-CM | POA: Diagnosis not present

## 2018-02-13 DIAGNOSIS — M25511 Pain in right shoulder: Secondary | ICD-10-CM | POA: Diagnosis not present

## 2018-02-13 DIAGNOSIS — E7849 Other hyperlipidemia: Secondary | ICD-10-CM | POA: Diagnosis not present

## 2018-02-13 DIAGNOSIS — Z6822 Body mass index (BMI) 22.0-22.9, adult: Secondary | ICD-10-CM | POA: Diagnosis not present

## 2018-02-13 DIAGNOSIS — R82998 Other abnormal findings in urine: Secondary | ICD-10-CM | POA: Diagnosis not present

## 2018-02-13 DIAGNOSIS — D649 Anemia, unspecified: Secondary | ICD-10-CM | POA: Diagnosis not present

## 2018-02-13 DIAGNOSIS — E038 Other specified hypothyroidism: Secondary | ICD-10-CM | POA: Diagnosis not present

## 2018-02-13 DIAGNOSIS — M810 Age-related osteoporosis without current pathological fracture: Secondary | ICD-10-CM | POA: Diagnosis not present

## 2018-02-20 DIAGNOSIS — K5790 Diverticulosis of intestine, part unspecified, without perforation or abscess without bleeding: Secondary | ICD-10-CM | POA: Diagnosis not present

## 2018-02-20 DIAGNOSIS — M79676 Pain in unspecified toe(s): Secondary | ICD-10-CM | POA: Diagnosis not present

## 2018-02-20 DIAGNOSIS — M25511 Pain in right shoulder: Secondary | ICD-10-CM | POA: Diagnosis not present

## 2018-02-20 DIAGNOSIS — Z0000 Encounter for general adult medical examination without abnormal findings: Secondary | ICD-10-CM | POA: Diagnosis not present

## 2018-03-19 ENCOUNTER — Ambulatory Visit (INDEPENDENT_AMBULATORY_CARE_PROVIDER_SITE_OTHER): Payer: Medicare Other | Admitting: *Deleted

## 2018-03-19 DIAGNOSIS — R001 Bradycardia, unspecified: Secondary | ICD-10-CM | POA: Diagnosis not present

## 2018-03-20 LAB — CUP PACEART REMOTE DEVICE CHECK
Battery Impedance: 329 Ohm
Brady Statistic AP VP Percent: 0 %
Brady Statistic AP VS Percent: 100 %
Brady Statistic AS VS Percent: 0 %
Date Time Interrogation Session: 20200304194608
Implantable Lead Implant Date: 20050902
Implantable Lead Location: 753859
Implantable Lead Location: 753860
Implantable Lead Model: 4469
Implantable Lead Serial Number: 449276
Lead Channel Impedance Value: 347 Ohm
Lead Channel Pacing Threshold Pulse Width: 0.4 ms
Lead Channel Setting Pacing Amplitude: 2.5 V
MDC IDC LEAD IMPLANT DT: 20060208
MDC IDC LEAD SERIAL: 4470032319
MDC IDC MSMT BATTERY REMAINING LONGEVITY: 106 mo
MDC IDC MSMT BATTERY VOLTAGE: 2.79 V
MDC IDC MSMT LEADCHNL RA IMPEDANCE VALUE: 616 Ohm
MDC IDC MSMT LEADCHNL RA PACING THRESHOLD AMPLITUDE: 0.75 V
MDC IDC MSMT LEADCHNL RV PACING THRESHOLD AMPLITUDE: 0.75 V
MDC IDC MSMT LEADCHNL RV PACING THRESHOLD PULSEWIDTH: 0.4 ms
MDC IDC PG IMPLANT DT: 20150126
MDC IDC SET LEADCHNL RA PACING AMPLITUDE: 2 V
MDC IDC SET LEADCHNL RV PACING PULSEWIDTH: 0.4 ms
MDC IDC SET LEADCHNL RV SENSING SENSITIVITY: 2 mV
MDC IDC STAT BRADY AS VP PERCENT: 0 %

## 2018-03-27 NOTE — Progress Notes (Signed)
Remote pacemaker transmission.   

## 2018-04-01 ENCOUNTER — Ambulatory Visit: Payer: Medicare Other | Admitting: Sports Medicine

## 2018-04-15 ENCOUNTER — Other Ambulatory Visit: Payer: Self-pay

## 2018-04-15 ENCOUNTER — Ambulatory Visit: Payer: Medicare Other | Admitting: Sports Medicine

## 2018-04-15 ENCOUNTER — Encounter: Payer: Self-pay | Admitting: Sports Medicine

## 2018-04-15 VITALS — Temp 97.9°F

## 2018-04-15 DIAGNOSIS — L601 Onycholysis: Secondary | ICD-10-CM

## 2018-04-15 DIAGNOSIS — M79674 Pain in right toe(s): Secondary | ICD-10-CM | POA: Diagnosis not present

## 2018-04-15 MED ORDER — NEOMYCIN-POLYMYXIN-HC 3.5-10000-1 OT SOLN: OTIC | 0 refills | Status: DC

## 2018-04-15 NOTE — Progress Notes (Signed)
Subjective: Robin Atkins is a 85 y.o. female patient seen today in office with complaint of mildly painful thickened and elongated toenail and lifting 1st toenail on right; unable to trim. Patient states that right great toe started to hurt her on Saturday. Patient also states that she wants her callus trimmed as well.  Patient denies any constitutional symptoms or drainage or any acute symptoms. Patient has no other pedal complaints at this time.   Patient Active Problem List   Diagnosis Date Noted  . Chest pain 04/22/2014  . Left upper arm pain 04/22/2014  . Hyperlipidemia 02/05/2013  . Fracture, femur, shaft ([facility]) 04/29/2012  . Hypothyroidism 04/29/2012  . Other and unspecified hyperlipidemia 04/29/2012  . SICK SINUS SYNDROME 02/24/2010    Current Outpatient Medications on File Prior to Visit  Medication Sig Dispense Refill  . Cholecalciferol (VITAMIN D) 2000 UNITS tablet Take 2,000 Units by mouth daily.    Marland Kitchen levothyroxine (SYNTHROID, LEVOTHROID) 50 MCG tablet Take 1 tablet by mouth daily.  1  . Multiple Vitamins-Minerals (MULTIVITAMIN ADULT PO) Take 1 tablet by mouth daily.    . mupirocin ointment (BACTROBAN) 2 % APPLY THREE TIMES A DAY TO AFFECTED AREA  3  . Omega-3 Fatty Acids (FISH OIL PO) Take 1 tablet by mouth daily.     . pravastatin (PRAVACHOL) 20 MG tablet Take 20 mg by mouth at bedtime.     Marland Kitchen UNABLE TO FIND OUTPATIENT PHYSICAL THERAPY   Diagnosis: Left shoulder glenohumeral osteoarthritis 1 Mutually Defined 0  . vitamin B-12 (CYANOCOBALAMIN) 1000 MCG tablet Take 1,000 mcg by mouth daily.     No current facility-administered medications on file prior to visit.     No Known Allergies  Objective: Physical Exam  General: Well developed, nourished, no acute distress, awake, alert and oriented x 3  Vascular: Dorsalis pedis artery 1/4 bilateral, Posterior tibial artery 1/4 bilateral, skin temperature warm to warm proximal to distal bilateral lower extremities, +  varicosities, scant pedal hair present bilateral.  Neurological: Gross sensation present via light touch bilateral.   Dermatological: Skin is warm, dry, and supple bilateral, Right great toenail is partially attached and is long, thick, and discolored with mild subungal debris consistent with onycholysis, no webspace macerations present bilateral, no open lesions present bilateral, + minimal callus hyperkeratotic tissue present medial 1st toe on right>left. No signs of infection bilateral.  Musculoskeletal:  Asymptomatic limited range of motion bilateral hallux with hammertoe boney deformities noted bilateral. Muscular strength within normal limits without pain on range of motion. No pain with calf compression bilateral.  Assessment and Plan:  Problem List Items Addressed This Visit    None    Visit Diagnoses    Onycholysis of toenail    -  Primary   Relevant Medications   neomycin-polymyxin-hydrocortisone (CORTISPORIN) OTIC solution   Toe pain, right         -Examined patient.  -Discussed treatment options for painful mycotic nail and callus to toes -Mechanically debrided and reduced mycotic right 1st toenail with sterile nail nipper and dremel nail file without incident. -Advised patient for the next week to soak with epsom salt and use corticosporin until there is no more soreness on the toe afterwards may keep her nails filed to prevent excessive thickness from building up on right great eos  -Advised daily skin emollients for callus skin at first toe and advised patient that she will always have calluses because of the way that she walks and stands and arthritis that is  present in her toes that decreased motion at no charge smoothed the callus areas with bur -Patient to return as needed or sooner if symptoms worsen.  Asencion Islam, DPM

## 2018-04-17 DIAGNOSIS — R7989 Other specified abnormal findings of blood chemistry: Secondary | ICD-10-CM | POA: Diagnosis not present

## 2018-04-17 DIAGNOSIS — D649 Anemia, unspecified: Secondary | ICD-10-CM | POA: Diagnosis not present

## 2018-04-17 DIAGNOSIS — E039 Hypothyroidism, unspecified: Secondary | ICD-10-CM | POA: Diagnosis not present

## 2018-04-18 DIAGNOSIS — D649 Anemia, unspecified: Secondary | ICD-10-CM | POA: Diagnosis not present

## 2018-06-18 ENCOUNTER — Ambulatory Visit (INDEPENDENT_AMBULATORY_CARE_PROVIDER_SITE_OTHER): Payer: Medicare Other | Admitting: *Deleted

## 2018-06-18 DIAGNOSIS — I495 Sick sinus syndrome: Secondary | ICD-10-CM

## 2018-06-19 LAB — CUP PACEART REMOTE DEVICE CHECK
Battery Impedance: 353 Ohm
Battery Remaining Longevity: 104 mo
Battery Voltage: 2.79 V
Brady Statistic AP VP Percent: 0 %
Brady Statistic AP VS Percent: 100 %
Brady Statistic AS VP Percent: 0 %
Brady Statistic AS VS Percent: 0 %
Date Time Interrogation Session: 20200603193936
Implantable Lead Implant Date: 20050902
Implantable Lead Implant Date: 20060208
Implantable Lead Location: 753859
Implantable Lead Location: 753860
Implantable Lead Model: 4469
Implantable Lead Model: 4470
Implantable Lead Serial Number: 4470032319
Implantable Lead Serial Number: 449276
Implantable Pulse Generator Implant Date: 20150126
Lead Channel Impedance Value: 341 Ohm
Lead Channel Impedance Value: 629 Ohm
Lead Channel Pacing Threshold Amplitude: 0.75 V
Lead Channel Pacing Threshold Amplitude: 0.875 V
Lead Channel Pacing Threshold Pulse Width: 0.4 ms
Lead Channel Pacing Threshold Pulse Width: 0.4 ms
Lead Channel Setting Pacing Amplitude: 2 V
Lead Channel Setting Pacing Amplitude: 2.5 V
Lead Channel Setting Pacing Pulse Width: 0.4 ms
Lead Channel Setting Sensing Sensitivity: 2 mV

## 2018-06-26 ENCOUNTER — Encounter: Payer: Self-pay | Admitting: Cardiology

## 2018-06-26 DIAGNOSIS — D649 Anemia, unspecified: Secondary | ICD-10-CM | POA: Diagnosis not present

## 2018-06-26 DIAGNOSIS — E038 Other specified hypothyroidism: Secondary | ICD-10-CM | POA: Diagnosis not present

## 2018-06-26 DIAGNOSIS — D6489 Other specified anemias: Secondary | ICD-10-CM | POA: Diagnosis not present

## 2018-06-26 NOTE — Progress Notes (Signed)
Remote pacemaker transmission.   

## 2018-07-24 ENCOUNTER — Other Ambulatory Visit: Payer: Self-pay

## 2018-07-24 ENCOUNTER — Encounter (HOSPITAL_COMMUNITY): Payer: Self-pay

## 2018-07-24 ENCOUNTER — Ambulatory Visit (HOSPITAL_COMMUNITY)
Admission: RE | Admit: 2018-07-24 | Discharge: 2018-07-24 | Disposition: A | Discharge status: Home / Self Care | Payer: Medicare Other | Source: Ambulatory Visit | Attending: Internal Medicine | Admitting: Internal Medicine

## 2018-07-24 DIAGNOSIS — M810 Age-related osteoporosis without current pathological fracture: Secondary | ICD-10-CM | POA: Insufficient documentation

## 2018-07-24 MED ORDER — DENOSUMAB 60 MG/ML ~~LOC~~ SOSY
60.0000 mg | PREFILLED_SYRINGE | Freq: Once | SUBCUTANEOUS | Status: AC
  Administered 2018-07-24: 60 mg via SUBCUTANEOUS
  Filled 2018-07-24: qty 1

## 2018-07-24 NOTE — Discharge Instructions (Signed)
Denosumab injection °What is this medicine? °DENOSUMAB (den oh sue mab) slows bone breakdown. Prolia is used to treat osteoporosis in women after menopause and in men, and in people who are taking corticosteroids for 6 months or more. Xgeva is used to treat a high calcium level due to cancer and to prevent bone fractures and other bone problems caused by multiple myeloma or cancer bone metastases. Xgeva is also used to treat giant cell tumor of the bone. °This medicine may be used for other purposes; ask your health care provider or pharmacist if you have questions. °COMMON BRAND NAME(S): Prolia, XGEVA °What should I tell my health care provider before I take this medicine? °They need to know if you have any of these conditions: °· dental disease °· having surgery or tooth extraction °· infection °· kidney disease °· low levels of calcium or Vitamin D in the blood °· malnutrition °· on hemodialysis °· skin conditions or sensitivity °· thyroid or parathyroid disease °· an unusual reaction to denosumab, other medicines, foods, dyes, or preservatives °· pregnant or trying to get pregnant °· breast-feeding °How should I use this medicine? °This medicine is for injection under the skin. It is given by a health care professional in a hospital or clinic setting. °A special MedGuide will be given to you before each treatment. Be sure to read this information carefully each time. °For Prolia, talk to your pediatrician regarding the use of this medicine in children. Special care may be needed. For Xgeva, talk to your pediatrician regarding the use of this medicine in children. While this drug may be prescribed for children as young as 13 years for selected conditions, precautions do apply. °Overdosage: If you think you have taken too much of this medicine contact a poison control center or emergency room at once. °NOTE: This medicine is only for you. Do not share this medicine with others. °What if I miss a dose? °It is  important not to miss your dose. Call your doctor or health care professional if you are unable to keep an appointment. °What may interact with this medicine? °Do not take this medicine with any of the following medications: °· other medicines containing denosumab °This medicine may also interact with the following medications: °· medicines that lower your chance of fighting infection °· steroid medicines like prednisone or cortisone °This list may not describe all possible interactions. Give your health care provider a list of all the medicines, herbs, non-prescription drugs, or dietary supplements you use. Also tell them if you smoke, drink alcohol, or use illegal drugs. Some items may interact with your medicine. °What should I watch for while using this medicine? °Visit your doctor or health care professional for regular checks on your progress. Your doctor or health care professional may order blood tests and other tests to see how you are doing. °Call your doctor or health care professional for advice if you get a fever, chills or sore throat, or other symptoms of a cold or flu. Do not treat yourself. This drug may decrease your body's ability to fight infection. Try to avoid being around people who are sick. °You should make sure you get enough calcium and vitamin D while you are taking this medicine, unless your doctor tells you not to. Discuss the foods you eat and the vitamins you take with your health care professional. °See your dentist regularly. Brush and floss your teeth as directed. Before you have any dental work done, tell your dentist you are   receiving this medicine. Do not become pregnant while taking this medicine or for 5 months after stopping it. Talk with your doctor or health care professional about your birth control options while taking this medicine. Women should inform their doctor if they wish to become pregnant or think they might be pregnant. There is a potential for serious side  effects to an unborn child. Talk to your health care professional or pharmacist for more information. What side effects may I notice from receiving this medicine? Side effects that you should report to your doctor or health care professional as soon as possible:  allergic reactions like skin rash, itching or hives, swelling of the face, lips, or tongue  bone pain  breathing problems  dizziness  jaw pain, especially after dental work  redness, blistering, peeling of the skin  signs and symptoms of infection like fever or chills; cough; sore throat; pain or trouble passing urine  signs of low calcium like fast heartbeat, muscle cramps or muscle pain; pain, tingling, numbness in the hands or feet; seizures  unusual bleeding or bruising  unusually weak or tired Side effects that usually do not require medical attention (report to your doctor or health care professional if they continue or are bothersome):  constipation  diarrhea  headache  joint pain  loss of appetite  muscle pain  runny nose  tiredness  upset stomach This list may not describe all possible side effects. Call your doctor for medical advice about side effects. You may report side effects to FDA at 1-800-FDA-1088. Where should I keep my medicine? This medicine is only given in a clinic, doctor's office, or other health care setting and will not be stored at home. NOTE: This sheet is a summary. It may not cover all possible information. If you have questions about this medicine, talk to your doctor, pharmacist, or health care provider.  2020 [employer] Standard (2017-05-10 16:10:44)

## 2018-08-06 DIAGNOSIS — Z961 Presence of intraocular lens: Secondary | ICD-10-CM | POA: Diagnosis not present

## 2018-08-06 DIAGNOSIS — H43813 Vitreous degeneration, bilateral: Secondary | ICD-10-CM | POA: Diagnosis not present

## 2018-08-06 DIAGNOSIS — H04123 Dry eye syndrome of bilateral lacrimal glands: Secondary | ICD-10-CM | POA: Diagnosis not present

## 2018-08-21 DIAGNOSIS — M810 Age-related osteoporosis without current pathological fracture: Secondary | ICD-10-CM | POA: Diagnosis not present

## 2018-08-21 DIAGNOSIS — K219 Gastro-esophageal reflux disease without esophagitis: Secondary | ICD-10-CM | POA: Diagnosis not present

## 2018-08-21 DIAGNOSIS — D649 Anemia, unspecified: Secondary | ICD-10-CM | POA: Diagnosis not present

## 2018-08-21 DIAGNOSIS — Z950 Presence of cardiac pacemaker: Secondary | ICD-10-CM | POA: Diagnosis not present

## 2018-09-03 DIAGNOSIS — Z23 Encounter for immunization: Secondary | ICD-10-CM | POA: Diagnosis not present

## 2018-09-17 ENCOUNTER — Ambulatory Visit (INDEPENDENT_AMBULATORY_CARE_PROVIDER_SITE_OTHER): Payer: Medicare Other | Admitting: *Deleted

## 2018-09-17 DIAGNOSIS — I495 Sick sinus syndrome: Secondary | ICD-10-CM | POA: Diagnosis not present

## 2018-09-18 LAB — CUP PACEART REMOTE DEVICE CHECK
Battery Impedance: 402 Ohm
Battery Remaining Longevity: 99 mo
Battery Voltage: 2.79 V
Brady Statistic AP VP Percent: 0 %
Brady Statistic AP VS Percent: 100 %
Brady Statistic AS VP Percent: 0 %
Brady Statistic AS VS Percent: 0 %
Date Time Interrogation Session: 20200902190459
Implantable Lead Implant Date: 20050902
Implantable Lead Implant Date: 20060208
Implantable Lead Location: 753859
Implantable Lead Location: 753860
Implantable Lead Model: 4469
Implantable Lead Model: 4470
Implantable Lead Serial Number: 4470032319
Implantable Lead Serial Number: 449276
Implantable Pulse Generator Implant Date: 20150126
Lead Channel Impedance Value: 347 Ohm
Lead Channel Impedance Value: 638 Ohm
Lead Channel Pacing Threshold Amplitude: 0.75 V
Lead Channel Pacing Threshold Amplitude: 0.875 V
Lead Channel Pacing Threshold Pulse Width: 0.4 ms
Lead Channel Pacing Threshold Pulse Width: 0.4 ms
Lead Channel Setting Pacing Amplitude: 2 V
Lead Channel Setting Pacing Amplitude: 2.5 V
Lead Channel Setting Pacing Pulse Width: 0.4 ms
Lead Channel Setting Sensing Sensitivity: 2 mV

## 2018-10-01 DIAGNOSIS — Z1231 Encounter for screening mammogram for malignant neoplasm of breast: Secondary | ICD-10-CM | POA: Diagnosis not present

## 2018-10-03 ENCOUNTER — Telehealth: Payer: Self-pay

## 2018-10-03 NOTE — Telephone Encounter (Signed)
No phone note needed. 

## 2018-10-03 NOTE — Progress Notes (Signed)
Remote pacemaker transmission.   

## 2018-10-14 DIAGNOSIS — D485 Neoplasm of uncertain behavior of skin: Secondary | ICD-10-CM | POA: Diagnosis not present

## 2018-10-14 DIAGNOSIS — Z85828 Personal history of other malignant neoplasm of skin: Secondary | ICD-10-CM | POA: Diagnosis not present

## 2018-10-14 DIAGNOSIS — C44722 Squamous cell carcinoma of skin of right lower limb, including hip: Secondary | ICD-10-CM | POA: Diagnosis not present

## 2018-10-28 DIAGNOSIS — E038 Other specified hypothyroidism: Secondary | ICD-10-CM | POA: Diagnosis not present

## 2018-10-28 DIAGNOSIS — E7849 Other hyperlipidemia: Secondary | ICD-10-CM | POA: Diagnosis not present

## 2018-12-15 DIAGNOSIS — Z85828 Personal history of other malignant neoplasm of skin: Secondary | ICD-10-CM | POA: Diagnosis not present

## 2018-12-16 ENCOUNTER — Ambulatory Visit: Payer: Medicare Other | Admitting: Sports Medicine

## 2018-12-18 ENCOUNTER — Telehealth: Payer: Self-pay

## 2018-12-18 NOTE — Telephone Encounter (Signed)
Pt states she did not do her home remote check because her monitor is not working. She talked to [employer] and they are sending a new monitor in 3-5- business days.

## 2018-12-22 ENCOUNTER — Ambulatory Visit (INDEPENDENT_AMBULATORY_CARE_PROVIDER_SITE_OTHER): Payer: Medicare Other | Admitting: *Deleted

## 2018-12-22 DIAGNOSIS — I495 Sick sinus syndrome: Secondary | ICD-10-CM | POA: Diagnosis not present

## 2018-12-22 LAB — CUP PACEART REMOTE DEVICE CHECK
Date Time Interrogation Session: 20201207161635
Implantable Lead Implant Date: 20050902
Implantable Lead Implant Date: 20060208
Implantable Lead Location: 753859
Implantable Lead Location: 753860
Implantable Lead Model: 4469
Implantable Lead Model: 4470
Implantable Lead Serial Number: 4470032319
Implantable Lead Serial Number: 449276
Implantable Pulse Generator Implant Date: 20150126

## 2018-12-23 ENCOUNTER — Ambulatory Visit: Payer: Medicare Other | Admitting: Sports Medicine

## 2018-12-23 ENCOUNTER — Other Ambulatory Visit: Payer: Self-pay

## 2018-12-23 DIAGNOSIS — L601 Onycholysis: Secondary | ICD-10-CM | POA: Diagnosis not present

## 2018-12-23 DIAGNOSIS — L853 Xerosis cutis: Secondary | ICD-10-CM

## 2018-12-23 DIAGNOSIS — M79674 Pain in right toe(s): Secondary | ICD-10-CM | POA: Diagnosis not present

## 2018-12-23 NOTE — Patient Instructions (Signed)
First apply Neosporin and antibiotic cream to right great toe once healed may use tea tree oil to the skin of the right great toe  Apply 1 to 2 drops of tea tree oil to skin of right great toe where toenail needs to be daily at bedtime to help your sleep nail as it grows back out  Apply foot miracle softening cream to callus skin around toe in the mornings as needed

## 2018-12-24 ENCOUNTER — Encounter: Payer: Self-pay | Admitting: Sports Medicine

## 2018-12-24 NOTE — Progress Notes (Signed)
Subjective: Robin Atkins is a 85 y.o. female patient seen today in office with complaint of mildly painful thickened and elongated toenail and lifting 1st toenail on right; unable to trim reports that her nail was doing good and growing out with no problems and then it slowly is started to get very thick and discolored and crumbly and is unsure what is going on with her toenail patient denies any increase in redness warmth swelling or drainage from around the toe and also reports that the skin at her first toe is very dry and wants to talk about what she needs to do for this toe.  Patient denies any constitutional symptoms.  No other pedal complaints at this time.   Patient Active Problem List   Diagnosis Date Noted  . Chest pain 04/22/2014  . Left upper arm pain 04/22/2014  . Hyperlipidemia 02/05/2013  . Fracture, femur, shaft ([place]) 04/29/2012  . Hypothyroidism 04/29/2012  . Other and unspecified hyperlipidemia 04/29/2012  . SICK SINUS SYNDROME 02/24/2010    Current Outpatient Medications on File Prior to Visit  Medication Sig Dispense Refill  . Cholecalciferol (VITAMIN D) 2000 UNITS tablet Take 2,000 Units by mouth daily.    Marland Kitchen levothyroxine (SYNTHROID, LEVOTHROID) 50 MCG tablet Take 1 tablet by mouth daily.  1  . Multiple Vitamins-Minerals (MULTIVITAMIN ADULT PO) Take 1 tablet by mouth daily.    . mupirocin ointment (BACTROBAN) 2 % APPLY THREE TIMES A DAY TO AFFECTED AREA  3  . neomycin-polymyxin-hydrocortisone (CORTISPORIN) OTIC solution Apply 1-2 drops to toe twice a day 10 mL 0  . Omega-3 Fatty Acids (FISH OIL PO) Take 1 tablet by mouth daily.     . pravastatin (PRAVACHOL) 20 MG tablet Take 20 mg by mouth at bedtime.     Marland Kitchen UNABLE TO FIND OUTPATIENT PHYSICAL THERAPY   Diagnosis: Left shoulder glenohumeral osteoarthritis 1 Mutually Defined 0  . vitamin B-12 (CYANOCOBALAMIN) 1000 MCG tablet Take 1,000 mcg by mouth daily.    Marland Kitchen levothyroxine (SYNTHROID) 75 MCG tablet Take 75 mcg by  mouth every morning. Takes 19mcg 6 days per week, then 30mcg 1 day per week.     No current facility-administered medications on file prior to visit.     No Known Allergies  Objective: Physical Exam  General: Well developed, nourished, no acute distress, awake, alert and oriented x 3  Vascular: Dorsalis pedis artery 1/4 bilateral, Posterior tibial artery 1/4 bilateral, skin temperature warm to warm proximal to distal bilateral lower extremities, + varicosities, scant pedal hair present bilateral.  Neurological: Gross sensation present via light touch bilateral.   Dermatological: Skin is warm, dry, and supple bilateral, Right great toenail is partially attached and is long, thick, and discolored with mild subungal debris consistent with onycholysis and excessive dryness due to soaking excessively with Epson salt, no webspace macerations present bilateral, no open lesions present bilateral, + minimal callus hyperkeratotic tissue present medial 1st toe on right>left with significant dryness from excessively soaking. No signs of infection bilateral.  Musculoskeletal:  Asymptomatic limited range of motion bilateral hallux with hammertoe boney deformities noted bilateral. Muscular strength within normal limits without pain on range of motion. No pain with calf compression bilateral.  Assessment and Plan:  Problem List Items Addressed This Visit    None    Visit Diagnoses    Onycholysis of toenail    -  Primary   Toe pain, right       Dry skin         -  Examined patient.  -Discussed treatment options for painful mycotic nail with onycholysis and dry skin -Advised patient to discontinue soaking with warm water and Epson salt because this is causing her skin to dry up and as her nail grows out the first toenail is growing out very soft and crumbly due to the excessive dryness and soaking with Epson salt excessively -Advised patient to use tea tree oil after a few days since today I did have a  little bit of bleeding at the proximal nail fold after debriding off the loose nail fragments so advised patient for the next few days to use a little bit of Neosporin and Band-Aid after the area seems like it has healed may start the tea tree oil treatment as instructed -Advised daily skin emollients for callus skin at first toe dispensed foot miracle cream for patient to use the area -Patient to return as needed or sooner if symptoms worsen.  Asencion Islam, DPM

## 2019-01-15 DIAGNOSIS — L245 Irritant contact dermatitis due to other chemical products: Secondary | ICD-10-CM | POA: Diagnosis not present

## 2019-01-15 DIAGNOSIS — Z85828 Personal history of other malignant neoplasm of skin: Secondary | ICD-10-CM | POA: Diagnosis not present

## 2019-01-18 NOTE — Progress Notes (Signed)
PPM Remote  

## 2019-01-29 ENCOUNTER — Other Ambulatory Visit: Payer: Self-pay

## 2019-01-29 ENCOUNTER — Ambulatory Visit (HOSPITAL_COMMUNITY)
Admission: RE | Admit: 2019-01-29 | Discharge: 2019-01-29 | Disposition: A | Discharge status: Home / Self Care | Payer: Medicare Other | Source: Ambulatory Visit | Attending: Internal Medicine | Admitting: Internal Medicine

## 2019-01-29 ENCOUNTER — Encounter (HOSPITAL_COMMUNITY): Payer: Self-pay

## 2019-01-29 DIAGNOSIS — Z85828 Personal history of other malignant neoplasm of skin: Secondary | ICD-10-CM | POA: Diagnosis not present

## 2019-01-29 DIAGNOSIS — D485 Neoplasm of uncertain behavior of skin: Secondary | ICD-10-CM | POA: Diagnosis not present

## 2019-01-29 DIAGNOSIS — M810 Age-related osteoporosis without current pathological fracture: Secondary | ICD-10-CM | POA: Diagnosis not present

## 2019-01-29 DIAGNOSIS — C44722 Squamous cell carcinoma of skin of right lower limb, including hip: Secondary | ICD-10-CM | POA: Diagnosis not present

## 2019-01-29 MED ORDER — DENOSUMAB 60 MG/ML ~~LOC~~ SOSY
60.0000 mg | PREFILLED_SYRINGE | Freq: Once | SUBCUTANEOUS | Status: AC
  Administered 2019-01-29: 60 mg via SUBCUTANEOUS
  Filled 2019-01-29: qty 1

## 2019-01-29 NOTE — Discharge Instructions (Signed)
Denosumab injection What is this medicine? DENOSUMAB (den oh sue mab) slows bone breakdown. Prolia is used to treat osteoporosis in women after menopause and in men, and in people who are taking corticosteroids for 6 months or more. Delton See is used to treat a high calcium level due to cancer and to prevent bone fractures and other bone problems caused by multiple myeloma or cancer bone metastases. Delton See is also used to treat giant cell tumor of the bone. This medicine may be used for other purposes; ask your health care provider or pharmacist if you have questions. COMMON BRAND NAME(S): Prolia, XGEVA What should I tell my health care provider before I take this medicine? They need to know if you have any of these conditions:  dental disease  having surgery or tooth extraction  infection  kidney disease  low levels of calcium or Vitamin D in the blood  malnutrition  on hemodialysis  skin conditions or sensitivity  thyroid or parathyroid disease  an unusual reaction to denosumab, other medicines, foods, dyes, or preservatives  pregnant or trying to get pregnant  breast-feeding How should I use this medicine? This medicine is for injection under the skin. It is given by a health care professional in a hospital or clinic setting. A special MedGuide will be given to you before each treatment. Be sure to read this information carefully each time. For Prolia, talk to your pediatrician regarding the use of this medicine in children. Special care may be needed. For Delton See, talk to your pediatrician regarding the use of this medicine in children. While this drug may be prescribed for children as young as 13 years for selected conditions, precautions do apply. Overdosage: If you think you have taken too much of this medicine contact a poison control center or emergency room at once. NOTE: This medicine is only for you. Do not share this medicine with others. What if I miss a dose? It is  important not to miss your dose. Call your doctor or health care professional if you are unable to keep an appointment. What may interact with this medicine? Do not take this medicine with any of the following medications:  other medicines containing denosumab This medicine may also interact with the following medications:  medicines that lower your chance of fighting infection  steroid medicines like prednisone or cortisone This list may not describe all possible interactions. Give your health care provider a list of all the medicines, herbs, non-prescription drugs, or dietary supplements you use. Also tell them if you smoke, drink alcohol, or use illegal drugs. Some items may interact with your medicine. What should I watch for while using this medicine? Visit your doctor or health care professional for regular checks on your progress. Your doctor or health care professional may order blood tests and other tests to see how you are doing. Call your doctor or health care professional for advice if you get a fever, chills or sore throat, or other symptoms of a cold or flu. Do not treat yourself. This drug may decrease your body's ability to fight infection. Try to avoid being around people who are sick. You should make sure you get enough calcium and vitamin D while you are taking this medicine, unless your doctor tells you not to. Discuss the foods you eat and the vitamins you take with your health care professional. See your dentist regularly. Brush and floss your teeth as directed. Before you have any dental work done, tell your dentist you are  receiving this medicine. Do not become pregnant while taking this medicine or for 5 months after stopping it. Talk with your doctor or health care professional about your birth control options while taking this medicine. Women should inform their doctor if they wish to become pregnant or think they might be pregnant. There is a potential for serious side  effects to an unborn child. Talk to your health care professional or pharmacist for more information. What side effects may I notice from receiving this medicine? Side effects that you should report to your doctor or health care professional as soon as possible:  allergic reactions like skin rash, itching or hives, swelling of the face, lips, or tongue  bone pain  breathing problems  dizziness  jaw pain, especially after dental work  redness, blistering, peeling of the skin  signs and symptoms of infection like fever or chills; cough; sore throat; pain or trouble passing urine  signs of low calcium like fast heartbeat, muscle cramps or muscle pain; pain, tingling, numbness in the hands or feet; seizures  unusual bleeding or bruising  unusually weak or tired Side effects that usually do not require medical attention (report to your doctor or health care professional if they continue or are bothersome):  constipation  diarrhea  headache  joint pain  loss of appetite  muscle pain  runny nose  tiredness  upset stomach This list may not describe all possible side effects. Call your doctor for medical advice about side effects. You may report side effects to FDA at 1-800-FDA-1088. Where should I keep my medicine? This medicine is only given in a clinic, doctor's office, or other health care setting and will not be stored at home. NOTE: This sheet is a summary. It may not cover all possible information. If you have questions about this medicine, talk to your doctor, pharmacist, or health care provider.  2020 Elsevier/Gold Standard (2017-05-10 16:10:44)

## 2019-02-12 ENCOUNTER — Ambulatory Visit: Payer: Medicare Other

## 2019-02-19 ENCOUNTER — Ambulatory Visit: Payer: Medicare Other | Attending: Internal Medicine

## 2019-02-19 DIAGNOSIS — Z23 Encounter for immunization: Secondary | ICD-10-CM | POA: Insufficient documentation

## 2019-02-19 NOTE — Progress Notes (Signed)
   Covid-19 Vaccination Clinic  Name:  Robin Atkins    MRN: [id number] DOB: 25-Nov-1924  02/19/2019  Robin Atkins was observed post Covid-19 immunization for 15 minutes without incidence. She was provided with Vaccine Information Sheet and instruction to access the V-Safe system.   Robin Atkins was instructed to call 911 with any severe reactions post vaccine: Marland Kitchen Difficulty breathing  . Swelling of your face and throat  . A fast heartbeat  . A bad rash all over your body  . Dizziness and weakness    Immunizations Administered    Name Date Dose VIS Date Route   Pfizer COVID-19 Vaccine 02/19/2019 10:56 AM 0.3 mL 12/26/2018 Intramuscular   Manufacturer: [employer], [employer]   Lot: [id number]   NDC: [address]-7

## 2019-03-01 ENCOUNTER — Ambulatory Visit: Payer: Medicare Other

## 2019-03-12 DIAGNOSIS — Z0000 Encounter for general adult medical examination without abnormal findings: Secondary | ICD-10-CM | POA: Diagnosis not present

## 2019-03-12 DIAGNOSIS — M810 Age-related osteoporosis without current pathological fracture: Secondary | ICD-10-CM | POA: Diagnosis not present

## 2019-03-12 DIAGNOSIS — E7849 Other hyperlipidemia: Secondary | ICD-10-CM | POA: Diagnosis not present

## 2019-03-12 DIAGNOSIS — E038 Other specified hypothyroidism: Secondary | ICD-10-CM | POA: Diagnosis not present

## 2019-03-16 ENCOUNTER — Ambulatory Visit: Payer: Medicare Other | Attending: Internal Medicine

## 2019-03-16 DIAGNOSIS — Z23 Encounter for immunization: Secondary | ICD-10-CM | POA: Insufficient documentation

## 2019-03-16 NOTE — Progress Notes (Signed)
   Covid-19 Vaccination Clinic  Name:  Robin Atkins    MRN: [id number] DOB: Jul 02, 1924  03/16/2019  Robin Atkins was observed post Covid-19 immunization for 15 minutes without incidence. She was provided with Vaccine Information Sheet and instruction to access the V-Safe system.   Robin Atkins was instructed to call 911 with any severe reactions post vaccine: Marland Kitchen Difficulty breathing  . Swelling of your face and throat  . A fast heartbeat  . A bad rash all over your body  . Dizziness and weakness    Immunizations Administered    Name Date Dose VIS Date Route   Pfizer COVID-19 Vaccine 03/16/2019  2:52 PM 0.3 mL 12/26/2018 Intramuscular   Manufacturer: [employer], [employer]   Lot: [id number]   NDC: [address]-0

## 2019-03-18 DIAGNOSIS — R82998 Other abnormal findings in urine: Secondary | ICD-10-CM | POA: Diagnosis not present

## 2019-03-19 DIAGNOSIS — I700 Atherosclerosis of aorta: Secondary | ICD-10-CM | POA: Diagnosis not present

## 2019-03-19 DIAGNOSIS — M4692 Unspecified inflammatory spondylopathy, cervical region: Secondary | ICD-10-CM | POA: Diagnosis not present

## 2019-03-19 DIAGNOSIS — Z950 Presence of cardiac pacemaker: Secondary | ICD-10-CM | POA: Diagnosis not present

## 2019-03-19 DIAGNOSIS — Z0000 Encounter for general adult medical examination without abnormal findings: Secondary | ICD-10-CM | POA: Diagnosis not present

## 2019-03-23 ENCOUNTER — Ambulatory Visit (INDEPENDENT_AMBULATORY_CARE_PROVIDER_SITE_OTHER): Payer: Medicare Other | Admitting: *Deleted

## 2019-03-23 DIAGNOSIS — C44722 Squamous cell carcinoma of skin of right lower limb, including hip: Secondary | ICD-10-CM | POA: Diagnosis not present

## 2019-03-23 DIAGNOSIS — I495 Sick sinus syndrome: Secondary | ICD-10-CM | POA: Diagnosis not present

## 2019-03-23 DIAGNOSIS — Z85828 Personal history of other malignant neoplasm of skin: Secondary | ICD-10-CM | POA: Diagnosis not present

## 2019-03-24 LAB — CUP PACEART REMOTE DEVICE CHECK
Battery Impedance: 476 Ohm
Battery Remaining Longevity: 94 mo
Battery Voltage: 2.79 V
Brady Statistic AP VP Percent: 0 %
Brady Statistic AP VS Percent: 100 %
Brady Statistic AS VP Percent: 0 %
Brady Statistic AS VS Percent: 0 %
Date Time Interrogation Session: 20210309122817
Implantable Lead Implant Date: 20050902
Implantable Lead Implant Date: 20060208
Implantable Lead Location: 753859
Implantable Lead Location: 753860
Implantable Lead Model: 4469
Implantable Lead Model: 4470
Implantable Lead Serial Number: 4470032319
Implantable Lead Serial Number: 449276
Implantable Pulse Generator Implant Date: 20150126
Lead Channel Impedance Value: 370 Ohm
Lead Channel Impedance Value: 709 Ohm
Lead Channel Pacing Threshold Amplitude: 0.75 V
Lead Channel Pacing Threshold Amplitude: 0.75 V
Lead Channel Pacing Threshold Pulse Width: 0.4 ms
Lead Channel Pacing Threshold Pulse Width: 0.4 ms
Lead Channel Setting Pacing Amplitude: 2 V
Lead Channel Setting Pacing Amplitude: 2.5 V
Lead Channel Setting Pacing Pulse Width: 0.4 ms
Lead Channel Setting Sensing Sensitivity: 2.8 mV

## 2019-03-24 NOTE — Progress Notes (Signed)
PPM Remote  

## 2019-04-16 DIAGNOSIS — Z23 Encounter for immunization: Secondary | ICD-10-CM | POA: Diagnosis not present

## 2019-04-16 DIAGNOSIS — S90111A Contusion of right great toe without damage to nail, initial encounter: Secondary | ICD-10-CM | POA: Diagnosis not present

## 2019-04-18 DIAGNOSIS — S90111A Contusion of right great toe without damage to nail, initial encounter: Secondary | ICD-10-CM | POA: Diagnosis not present

## 2019-04-30 DIAGNOSIS — S90211A Contusion of right great toe with damage to nail, initial encounter: Secondary | ICD-10-CM | POA: Diagnosis not present

## 2019-04-30 DIAGNOSIS — M79674 Pain in right toe(s): Secondary | ICD-10-CM | POA: Diagnosis not present

## 2019-05-28 DIAGNOSIS — M79674 Pain in right toe(s): Secondary | ICD-10-CM | POA: Diagnosis not present

## 2019-05-28 DIAGNOSIS — S90211A Contusion of right great toe with damage to nail, initial encounter: Secondary | ICD-10-CM | POA: Diagnosis not present

## 2019-06-09 DIAGNOSIS — R519 Headache, unspecified: Secondary | ICD-10-CM | POA: Diagnosis not present

## 2019-06-09 DIAGNOSIS — M25512 Pain in left shoulder: Secondary | ICD-10-CM | POA: Diagnosis not present

## 2019-06-09 DIAGNOSIS — W19XXXA Unspecified fall, initial encounter: Secondary | ICD-10-CM | POA: Diagnosis not present

## 2019-06-09 DIAGNOSIS — M4692 Unspecified inflammatory spondylopathy, cervical region: Secondary | ICD-10-CM | POA: Diagnosis not present

## 2019-06-12 NOTE — Progress Notes (Signed)
Spoke with patient regarding her upcoming Prolia appointment on August 05, 2019 at 1100.  Informed her the location would now be at [facility]. Address/phone number given to patient.  Patient voiced understanding.

## 2019-06-22 ENCOUNTER — Ambulatory Visit (INDEPENDENT_AMBULATORY_CARE_PROVIDER_SITE_OTHER): Payer: Medicare Other | Admitting: *Deleted

## 2019-06-22 DIAGNOSIS — R001 Bradycardia, unspecified: Secondary | ICD-10-CM

## 2019-06-22 LAB — CUP PACEART REMOTE DEVICE CHECK
Battery Impedance: 526 Ohm
Battery Remaining Longevity: 89 mo
Battery Voltage: 2.79 V
Brady Statistic AP VP Percent: 0 %
Brady Statistic AP VS Percent: 100 %
Brady Statistic AS VP Percent: 0 %
Brady Statistic AS VS Percent: 0 %
Date Time Interrogation Session: 20210607103942
Implantable Lead Implant Date: 20050902
Implantable Lead Implant Date: 20060208
Implantable Lead Location: 753859
Implantable Lead Location: 753860
Implantable Lead Model: 4469
Implantable Lead Model: 4470
Implantable Lead Serial Number: 4470032319
Implantable Lead Serial Number: 449276
Implantable Pulse Generator Implant Date: 20150126
Lead Channel Impedance Value: 353 Ohm
Lead Channel Impedance Value: 659 Ohm
Lead Channel Pacing Threshold Amplitude: 0.75 V
Lead Channel Pacing Threshold Amplitude: 0.875 V
Lead Channel Pacing Threshold Pulse Width: 0.4 ms
Lead Channel Pacing Threshold Pulse Width: 0.4 ms
Lead Channel Setting Pacing Amplitude: 2 V
Lead Channel Setting Pacing Amplitude: 2.5 V
Lead Channel Setting Pacing Pulse Width: 0.4 ms
Lead Channel Setting Sensing Sensitivity: 2 mV

## 2019-06-25 NOTE — Progress Notes (Signed)
Remote pacemaker transmission.   

## 2019-08-04 ENCOUNTER — Other Ambulatory Visit (HOSPITAL_COMMUNITY): Payer: Self-pay

## 2019-08-05 ENCOUNTER — Ambulatory Visit (HOSPITAL_COMMUNITY)
Admission: RE | Admit: 2019-08-05 | Discharge: 2019-08-05 | Disposition: A | Discharge status: Home / Self Care | Payer: Medicare Other | Source: Ambulatory Visit | Attending: Internal Medicine | Admitting: Internal Medicine

## 2019-08-05 ENCOUNTER — Ambulatory Visit (HOSPITAL_COMMUNITY): Payer: Medicare Other

## 2019-08-05 ENCOUNTER — Other Ambulatory Visit: Payer: Self-pay

## 2019-08-05 DIAGNOSIS — M810 Age-related osteoporosis without current pathological fracture: Secondary | ICD-10-CM | POA: Diagnosis not present

## 2019-08-05 MED ORDER — DENOSUMAB 60 MG/ML ~~LOC~~ SOSY
PREFILLED_SYRINGE | SUBCUTANEOUS | Status: AC
  Administered 2019-08-05: 60 mg via SUBCUTANEOUS
  Filled 2019-08-05: qty 1

## 2019-08-05 MED ORDER — DENOSUMAB 60 MG/ML ~~LOC~~ SOSY: 60.0000 mg | PREFILLED_SYRINGE | Freq: Once | SUBCUTANEOUS | Status: AC

## 2019-08-10 DIAGNOSIS — H52223 Regular astigmatism, bilateral: Secondary | ICD-10-CM | POA: Diagnosis not present

## 2019-09-03 ENCOUNTER — Ambulatory Visit: Payer: Medicare Other | Admitting: Sports Medicine

## 2019-09-03 ENCOUNTER — Other Ambulatory Visit: Payer: Self-pay

## 2019-09-03 ENCOUNTER — Encounter: Payer: Self-pay | Admitting: Sports Medicine

## 2019-09-03 DIAGNOSIS — L601 Onycholysis: Secondary | ICD-10-CM | POA: Diagnosis not present

## 2019-09-03 DIAGNOSIS — M79674 Pain in right toe(s): Secondary | ICD-10-CM | POA: Diagnosis not present

## 2019-09-03 DIAGNOSIS — L853 Xerosis cutis: Secondary | ICD-10-CM

## 2019-09-03 NOTE — Progress Notes (Signed)
Subjective: Robin Atkins is a 86 y.o. female patient seen today for follow-up evaluation toenail and right great toe as well as callus skin.  Patient reports that initially was doing good but then slowly the dry skin started to build back up and has been getting caught on her sock.  Patient denies any redness warmth drainage and does state that sometimes when he gets snagged it is uncomfortable but otherwise is doing okay.  No other pedal complaints at this time.   Patient Active Problem List   Diagnosis Date Noted  . Chest pain 04/22/2014  . Left upper arm pain 04/22/2014  . Hyperlipidemia 02/05/2013  . Fracture, femur, shaft (HCC) 04/29/2012  . Hypothyroidism 04/29/2012  . Other and unspecified hyperlipidemia 04/29/2012  . SICK SINUS SYNDROME 02/24/2010    Current Outpatient Medications on File Prior to Visit  Medication Sig Dispense Refill  . Cholecalciferol (VITAMIN D) 2000 UNITS tablet Take 2,000 Units by mouth daily.    Marland Kitchen levothyroxine (SYNTHROID) 75 MCG tablet Take 75 mcg by mouth every morning. Takes 6 days per week. On the 7th day does not take anything.    . levothyroxine (SYNTHROID, LEVOTHROID) 50 MCG tablet Take 1 tablet by mouth daily.  1  . Multiple Vitamins-Minerals (MULTIVITAMIN ADULT PO) Take 1 tablet by mouth daily.    . mupirocin ointment (BACTROBAN) 2 % APPLY THREE TIMES A DAY TO AFFECTED AREA  3  . neomycin-polymyxin-hydrocortisone (CORTISPORIN) OTIC solution Apply 1-2 drops to toe twice a day 10 mL 0  . ofloxacin (OCUFLOX) 0.3 % ophthalmic solution Place 1 drop into the left eye 4 (four) times daily.    . Omega-3 Fatty Acids (FISH OIL PO) Take 1 tablet by mouth daily.     . pravastatin (PRAVACHOL) 20 MG tablet Take 20 mg by mouth at bedtime.     Marland Kitchen UNABLE TO FIND OUTPATIENT PHYSICAL THERAPY   Diagnosis: Left shoulder glenohumeral osteoarthritis 1 Mutually Defined 0  . vitamin B-12 (CYANOCOBALAMIN) 1000 MCG tablet Take 1,000 mcg by mouth daily.     No  current facility-administered medications on file prior to visit.    No Known Allergies  Objective: Physical Exam  General: Well developed, nourished, no acute distress, awake, alert and oriented x 3  Vascular: Dorsalis pedis artery 1/4 bilateral, Posterior tibial artery 1/4 bilateral, skin temperature warm to warm proximal to distal bilateral lower extremities, + varicosities, scant pedal hair present bilateral.  Neurological: Gross sensation present via light touch bilateral.   Dermatological: Skin is warm, dry, and supple bilateral, Right great toenail is growing back in with excessive dryness, no webspace macerations present bilateral, no open lesions present bilateral, + minimal callus hyperkeratotic tissue present medial 1st toe on right>left and left plantar heel. No signs of infection bilateral.  Musculoskeletal:  Asymptomatic limited range of motion bilateral hallux with hammertoe boney deformities noted bilateral. Muscular strength within normal limits without pain on range of motion. No pain with calf compression bilateral.  Assessment and Plan:  Problem List Items Addressed This Visit    None    Visit Diagnoses    Onycholysis of toenail    -  Primary   Toe pain, right       Dry skin         -Examined patient.  -Discussed treatment options for painful mycotic nail with onycholysis and dry skin -Advised patient to use Vicks VapoRub after bath or shower and nail file to get rid of excessive buildup and dry skin  at hallux nail bed -Advised patient to continue with daily skin emollients for plantar calluses and no additional charge today mechanically debrided callus to first toes bilateral and plantar left heel without incident. -Patient to return in 3 months for nail care or sooner if symptoms worsen.  Asencion Islam, DPM

## 2019-09-22 ENCOUNTER — Ambulatory Visit (INDEPENDENT_AMBULATORY_CARE_PROVIDER_SITE_OTHER): Payer: Medicare Other | Admitting: *Deleted

## 2019-09-22 DIAGNOSIS — E039 Hypothyroidism, unspecified: Secondary | ICD-10-CM | POA: Diagnosis not present

## 2019-09-22 DIAGNOSIS — E785 Hyperlipidemia, unspecified: Secondary | ICD-10-CM | POA: Diagnosis not present

## 2019-09-22 DIAGNOSIS — D649 Anemia, unspecified: Secondary | ICD-10-CM | POA: Diagnosis not present

## 2019-09-22 DIAGNOSIS — R001 Bradycardia, unspecified: Secondary | ICD-10-CM | POA: Diagnosis not present

## 2019-09-22 LAB — CUP PACEART REMOTE DEVICE CHECK
Battery Impedance: 576 Ohm
Battery Remaining Longevity: 85 mo
Battery Voltage: 2.79 V
Brady Statistic AP VP Percent: 0 %
Brady Statistic AP VS Percent: 100 %
Brady Statistic AS VP Percent: 0 %
Brady Statistic AS VS Percent: 0 %
Date Time Interrogation Session: 20210906142956
Implantable Lead Implant Date: 20050902
Implantable Lead Implant Date: 20060208
Implantable Lead Location: 753859
Implantable Lead Location: 753860
Implantable Lead Model: 4469
Implantable Lead Model: 4470
Implantable Lead Serial Number: 4470032319
Implantable Lead Serial Number: 449276
Implantable Pulse Generator Implant Date: 20150126
Lead Channel Impedance Value: 364 Ohm
Lead Channel Impedance Value: 661 Ohm
Lead Channel Pacing Threshold Amplitude: 0.75 V
Lead Channel Pacing Threshold Amplitude: 0.875 V
Lead Channel Pacing Threshold Pulse Width: 0.4 ms
Lead Channel Pacing Threshold Pulse Width: 0.4 ms
Lead Channel Setting Pacing Amplitude: 2 V
Lead Channel Setting Pacing Amplitude: 2.5 V
Lead Channel Setting Pacing Pulse Width: 0.4 ms
Lead Channel Setting Sensing Sensitivity: 2 mV

## 2019-09-23 NOTE — Progress Notes (Signed)
Remote pacemaker transmission.   

## 2019-10-05 DIAGNOSIS — M25551 Pain in right hip: Secondary | ICD-10-CM | POA: Diagnosis not present

## 2019-10-05 DIAGNOSIS — M545 Low back pain: Secondary | ICD-10-CM | POA: Diagnosis not present

## 2019-10-07 DIAGNOSIS — Z1231 Encounter for screening mammogram for malignant neoplasm of breast: Secondary | ICD-10-CM | POA: Diagnosis not present

## 2019-10-17 DIAGNOSIS — Z23 Encounter for immunization: Secondary | ICD-10-CM | POA: Diagnosis not present

## 2019-10-30 ENCOUNTER — Encounter: Payer: Medicare Other | Admitting: Internal Medicine

## 2019-11-02 DIAGNOSIS — E039 Hypothyroidism, unspecified: Secondary | ICD-10-CM | POA: Diagnosis not present

## 2019-11-04 ENCOUNTER — Other Ambulatory Visit: Payer: Self-pay | Admitting: Internal Medicine

## 2019-11-04 DIAGNOSIS — R519 Headache, unspecified: Secondary | ICD-10-CM

## 2019-11-12 ENCOUNTER — Other Ambulatory Visit: Payer: Self-pay | Admitting: Internal Medicine

## 2019-11-12 ENCOUNTER — Ambulatory Visit
Admission: RE | Admit: 2019-11-12 | Discharge: 2019-11-12 | Disposition: A | Discharge status: Home / Self Care | Payer: Medicare Other | Source: Ambulatory Visit | Attending: Internal Medicine | Admitting: Internal Medicine

## 2019-11-12 DIAGNOSIS — R2681 Unsteadiness on feet: Secondary | ICD-10-CM

## 2019-11-12 DIAGNOSIS — R519 Headache, unspecified: Secondary | ICD-10-CM

## 2019-11-12 MED ORDER — IOPAMIDOL (ISOVUE-300) INJECTION 61%
75.0000 mL | Freq: Once | INTRAVENOUS | Status: AC | PRN
  Administered 2019-11-12: 75 mL via INTRAVENOUS

## 2019-11-30 DIAGNOSIS — M5030 Other cervical disc degeneration, unspecified cervical region: Secondary | ICD-10-CM | POA: Diagnosis not present

## 2019-12-02 DIAGNOSIS — M5030 Other cervical disc degeneration, unspecified cervical region: Secondary | ICD-10-CM | POA: Diagnosis not present

## 2019-12-03 ENCOUNTER — Encounter: Payer: Self-pay | Admitting: Sports Medicine

## 2019-12-03 ENCOUNTER — Other Ambulatory Visit: Payer: Self-pay

## 2019-12-03 ENCOUNTER — Ambulatory Visit: Payer: Medicare Other | Admitting: Sports Medicine

## 2019-12-03 DIAGNOSIS — B351 Tinea unguium: Secondary | ICD-10-CM | POA: Diagnosis not present

## 2019-12-03 DIAGNOSIS — M79674 Pain in right toe(s): Secondary | ICD-10-CM | POA: Diagnosis not present

## 2019-12-03 DIAGNOSIS — L853 Xerosis cutis: Secondary | ICD-10-CM

## 2019-12-03 DIAGNOSIS — M79675 Pain in left toe(s): Secondary | ICD-10-CM

## 2019-12-03 DIAGNOSIS — L601 Onycholysis: Secondary | ICD-10-CM

## 2019-12-03 NOTE — Progress Notes (Signed)
Subjective: Robin Atkins is a 86 y.o. female patient seen today for follow-up evaluation toenail and right great toe as well as callus skin.  Patient reports that her nail is doing good.   No other pedal complaints at this time.   Patient Active Problem List   Diagnosis Date Noted  . Chest pain 04/22/2014  . Left upper arm pain 04/22/2014  . Hyperlipidemia 02/05/2013  . Fracture, femur, shaft (HCC) 04/29/2012  . Hypothyroidism 04/29/2012  . Other and unspecified hyperlipidemia 04/29/2012  . SICK SINUS SYNDROME 02/24/2010    Current Outpatient Medications on File Prior to Visit  Medication Sig Dispense Refill  . Cholecalciferol (VITAMIN D) 2000 UNITS tablet Take 2,000 Units by mouth daily.    Marland Kitchen levothyroxine (SYNTHROID) 75 MCG tablet Take 75 mcg by mouth every morning. Takes 6 days per week. On the 7th day does not take anything.    . levothyroxine (SYNTHROID, LEVOTHROID) 50 MCG tablet Take 1 tablet by mouth daily.  1  . Multiple Vitamins-Minerals (MULTIVITAMIN ADULT PO) Take 1 tablet by mouth daily.    . mupirocin ointment (BACTROBAN) 2 % APPLY THREE TIMES A DAY TO AFFECTED AREA  3  . neomycin-polymyxin-hydrocortisone (CORTISPORIN) OTIC solution Apply 1-2 drops to toe twice a day 10 mL 0  . ofloxacin (OCUFLOX) 0.3 % ophthalmic solution Place 1 drop into the left eye 4 (four) times daily.    . Omega-3 Fatty Acids (FISH OIL PO) Take 1 tablet by mouth daily.     . pravastatin (PRAVACHOL) 20 MG tablet Take 20 mg by mouth at bedtime.     Marland Kitchen UNABLE TO FIND OUTPATIENT PHYSICAL THERAPY   Diagnosis: Left shoulder glenohumeral osteoarthritis 1 Mutually Defined 0  . vitamin B-12 (CYANOCOBALAMIN) 1000 MCG tablet Take 1,000 mcg by mouth daily.     No current facility-administered medications on file prior to visit.    No Known Allergies  Objective: Physical Exam  General: Well developed, nourished, no acute distress, awake, alert and oriented x 3  Vascular: Dorsalis pedis artery  1/4 bilateral, Posterior tibial artery 1/4 bilateral, skin temperature warm to warm proximal to distal bilateral lower extremities, + varicosities, scant pedal hair present bilateral.  Neurological: Gross sensation present via light touch bilateral.   Dermatological: Skin is warm, dry, and supple bilateral, Nails x 10 short and thick with improvement noted to right hallux nail, no webspace macerations present bilateral, no open lesions present bilateral, + minimal callus hyperkeratotic tissue present medial 1st toe on right>left and left plantar heel. No signs of infection bilateral.  Musculoskeletal:  Asymptomatic limited range of motion bilateral hallux with hammertoe boney deformities noted bilateral. Muscular strength within normal limits without pain on range of motion. No pain with calf compression bilateral.  Assessment and Plan:  Problem List Items Addressed This Visit    None    Visit Diagnoses    Onycholysis of toenail    -  Primary   Toe pain, right       Dry skin       Pain due to onychomycosis of toenails of both feet         -Examined patient.  -Discussed treatment options for painful mycotic nail with onycholysis and dry skin that's improving in nature  -Advised patient to use continue to use vicks daily  -Advised patient to continue with daily skin emollients for plantar calluses and no additional charge today mechanically debrided nails x 10 using a sterile nail nipper and callus to first  toes bilateral and plantar left heel without incident. -Patient to return in 3 months for nail care or sooner if symptoms worsen.  Asencion Islam, DPM

## 2019-12-14 DIAGNOSIS — M5030 Other cervical disc degeneration, unspecified cervical region: Secondary | ICD-10-CM | POA: Diagnosis not present

## 2019-12-17 DIAGNOSIS — M5030 Other cervical disc degeneration, unspecified cervical region: Secondary | ICD-10-CM | POA: Diagnosis not present

## 2019-12-21 ENCOUNTER — Ambulatory Visit (INDEPENDENT_AMBULATORY_CARE_PROVIDER_SITE_OTHER): Payer: Medicare Other

## 2019-12-21 DIAGNOSIS — R001 Bradycardia, unspecified: Secondary | ICD-10-CM

## 2019-12-23 LAB — CUP PACEART REMOTE DEVICE CHECK
Battery Impedance: 626 Ohm
Battery Remaining Longevity: 81 mo
Battery Voltage: 2.79 V
Brady Statistic AP VP Percent: 0 %
Brady Statistic AP VS Percent: 100 %
Brady Statistic AS VP Percent: 0 %
Brady Statistic AS VS Percent: 0 %
Date Time Interrogation Session: 20211206190926
Implantable Lead Implant Date: 20050902
Implantable Lead Implant Date: 20060208
Implantable Lead Location: 753859
Implantable Lead Location: 753860
Implantable Lead Model: 4469
Implantable Lead Model: 4470
Implantable Lead Serial Number: 4470032319
Implantable Lead Serial Number: 449276
Implantable Pulse Generator Implant Date: 20150126
Lead Channel Impedance Value: 341 Ohm
Lead Channel Impedance Value: 588 Ohm
Lead Channel Pacing Threshold Amplitude: 0.75 V
Lead Channel Pacing Threshold Amplitude: 0.875 V
Lead Channel Pacing Threshold Pulse Width: 0.4 ms
Lead Channel Pacing Threshold Pulse Width: 0.4 ms
Lead Channel Setting Pacing Amplitude: 2 V
Lead Channel Setting Pacing Amplitude: 2.5 V
Lead Channel Setting Pacing Pulse Width: 0.4 ms
Lead Channel Setting Sensing Sensitivity: 2 mV

## 2019-12-30 NOTE — Progress Notes (Signed)
Remote pacemaker transmission.   

## 2020-01-21 ENCOUNTER — Encounter (HOSPITAL_COMMUNITY): Payer: Medicare Other

## 2020-01-28 DIAGNOSIS — L570 Actinic keratosis: Secondary | ICD-10-CM | POA: Diagnosis not present

## 2020-01-28 DIAGNOSIS — Z85828 Personal history of other malignant neoplasm of skin: Secondary | ICD-10-CM | POA: Diagnosis not present

## 2020-01-28 DIAGNOSIS — C44722 Squamous cell carcinoma of skin of right lower limb, including hip: Secondary | ICD-10-CM | POA: Diagnosis not present

## 2020-01-28 DIAGNOSIS — D485 Neoplasm of uncertain behavior of skin: Secondary | ICD-10-CM | POA: Diagnosis not present

## 2020-02-02 ENCOUNTER — Other Ambulatory Visit (HOSPITAL_COMMUNITY): Payer: Self-pay | Admitting: *Deleted

## 2020-02-08 ENCOUNTER — Encounter (HOSPITAL_COMMUNITY): Payer: Medicare Other

## 2020-02-09 ENCOUNTER — Other Ambulatory Visit: Payer: Self-pay

## 2020-02-09 ENCOUNTER — Ambulatory Visit (HOSPITAL_COMMUNITY)
Admission: RE | Admit: 2020-02-09 | Discharge: 2020-02-09 | Disposition: A | Discharge status: Home / Self Care | Payer: Medicare Other | Source: Ambulatory Visit | Attending: Internal Medicine | Admitting: Internal Medicine

## 2020-02-09 DIAGNOSIS — M810 Age-related osteoporosis without current pathological fracture: Secondary | ICD-10-CM | POA: Insufficient documentation

## 2020-02-09 MED ORDER — DENOSUMAB 60 MG/ML ~~LOC~~ SOSY
PREFILLED_SYRINGE | SUBCUTANEOUS | Status: AC
  Filled 2020-02-09: qty 1

## 2020-02-09 MED ORDER — DENOSUMAB 60 MG/ML ~~LOC~~ SOSY
60.0000 mg | PREFILLED_SYRINGE | Freq: Once | SUBCUTANEOUS | Status: AC
  Administered 2020-02-09: 60 mg via SUBCUTANEOUS

## 2020-02-15 DIAGNOSIS — S81801A Unspecified open wound, right lower leg, initial encounter: Secondary | ICD-10-CM | POA: Diagnosis not present

## 2020-02-15 DIAGNOSIS — T8189XA Other complications of procedures, not elsewhere classified, initial encounter: Secondary | ICD-10-CM | POA: Diagnosis not present

## 2020-02-15 DIAGNOSIS — T8149XA Infection following a procedure, other surgical site, initial encounter: Secondary | ICD-10-CM | POA: Diagnosis not present

## 2020-02-20 DIAGNOSIS — Z96641 Presence of right artificial hip joint: Secondary | ICD-10-CM | POA: Diagnosis not present

## 2020-02-20 DIAGNOSIS — T8189XD Other complications of procedures, not elsewhere classified, subsequent encounter: Secondary | ICD-10-CM | POA: Diagnosis not present

## 2020-02-20 DIAGNOSIS — G43909 Migraine, unspecified, not intractable, without status migrainosus: Secondary | ICD-10-CM | POA: Diagnosis not present

## 2020-02-20 DIAGNOSIS — I341 Nonrheumatic mitral (valve) prolapse: Secondary | ICD-10-CM | POA: Diagnosis not present

## 2020-02-20 DIAGNOSIS — K5790 Diverticulosis of intestine, part unspecified, without perforation or abscess without bleeding: Secondary | ICD-10-CM | POA: Diagnosis not present

## 2020-02-20 DIAGNOSIS — T8149XD Infection following a procedure, other surgical site, subsequent encounter: Secondary | ICD-10-CM | POA: Diagnosis not present

## 2020-02-20 DIAGNOSIS — K219 Gastro-esophageal reflux disease without esophagitis: Secondary | ICD-10-CM | POA: Diagnosis not present

## 2020-02-20 DIAGNOSIS — E785 Hyperlipidemia, unspecified: Secondary | ICD-10-CM | POA: Diagnosis not present

## 2020-02-20 DIAGNOSIS — E039 Hypothyroidism, unspecified: Secondary | ICD-10-CM | POA: Diagnosis not present

## 2020-02-20 DIAGNOSIS — Z9181 History of falling: Secondary | ICD-10-CM | POA: Diagnosis not present

## 2020-02-20 DIAGNOSIS — M810 Age-related osteoporosis without current pathological fracture: Secondary | ICD-10-CM | POA: Diagnosis not present

## 2020-02-20 DIAGNOSIS — M1611 Unilateral primary osteoarthritis, right hip: Secondary | ICD-10-CM | POA: Diagnosis not present

## 2020-03-03 ENCOUNTER — Ambulatory Visit: Payer: Medicare Other | Admitting: Sports Medicine

## 2020-03-03 ENCOUNTER — Encounter: Payer: Self-pay | Admitting: Sports Medicine

## 2020-03-03 ENCOUNTER — Other Ambulatory Visit: Payer: Self-pay

## 2020-03-03 DIAGNOSIS — B351 Tinea unguium: Secondary | ICD-10-CM | POA: Diagnosis not present

## 2020-03-03 DIAGNOSIS — M79675 Pain in left toe(s): Secondary | ICD-10-CM | POA: Diagnosis not present

## 2020-03-03 DIAGNOSIS — M79674 Pain in right toe(s): Secondary | ICD-10-CM | POA: Diagnosis not present

## 2020-03-03 DIAGNOSIS — L601 Onycholysis: Secondary | ICD-10-CM

## 2020-03-03 NOTE — Progress Notes (Signed)
Subjective: Robin Atkins is a 87 y.o. female patient seen today for follow-up evaluation toenail and right great toe as well as callus skin.  Patient reports that her nail is doing good and that the nail trims have helped.   No other pedal complaints at this time.   Patient Active Problem List   Diagnosis Date Noted  . Chest pain 04/22/2014  . Left upper arm pain 04/22/2014  . Hyperlipidemia 02/05/2013  . Fracture, femur, shaft (HCC) 04/29/2012  . Hypothyroidism 04/29/2012  . Other and unspecified hyperlipidemia 04/29/2012  . SICK SINUS SYNDROME 02/24/2010    Current Outpatient Medications on File Prior to Visit  Medication Sig Dispense Refill  . Cholecalciferol (VITAMIN D) 2000 UNITS tablet Take 2,000 Units by mouth daily.    Marland Kitchen doxycycline (VIBRA-TABS) 100 MG tablet Take 100 mg by mouth 2 (two) times daily.    Marland Kitchen levothyroxine (SYNTHROID) 75 MCG tablet Take 75 mcg by mouth every morning. Takes 6 days per week. On the 7th day does not take anything.    . levothyroxine (SYNTHROID, LEVOTHROID) 50 MCG tablet Take 1 tablet by mouth daily.  1  . MEDROL 4 MG TBPK tablet Take by mouth.    . Multiple Vitamins-Minerals (MULTIVITAMIN ADULT PO) Take 1 tablet by mouth daily.    . mupirocin ointment (BACTROBAN) 2 % APPLY THREE TIMES A DAY TO AFFECTED AREA  3  . neomycin-polymyxin-hydrocortisone (CORTISPORIN) OTIC solution Apply 1-2 drops to toe twice a day 10 mL 0  . ofloxacin (OCUFLOX) 0.3 % ophthalmic solution Place 1 drop into the left eye 4 (four) times daily.    . Omega-3 Fatty Acids (FISH OIL PO) Take 1 tablet by mouth daily.     . pravastatin (PRAVACHOL) 20 MG tablet Take 20 mg by mouth at bedtime.     Marland Kitchen UNABLE TO FIND OUTPATIENT PHYSICAL THERAPY   Diagnosis: Left shoulder glenohumeral osteoarthritis 1 Mutually Defined 0  . vitamin B-12 (CYANOCOBALAMIN) 1000 MCG tablet Take 1,000 mcg by mouth daily.     No current facility-administered medications on file prior to visit.    No  Known Allergies  Objective: Physical Exam  General: Well developed, nourished, no acute distress, awake, alert and oriented x 3  Vascular: Dorsalis pedis artery 1/4 bilateral, Posterior tibial artery 0/4 bilateral, skin temperature warm to warm proximal to distal bilateral lower extremities, + varicosities, scant pedal hair present bilateral.  Neurological: Gross sensation present via light touch bilateral.   Dermatological: Skin is warm, dry, and supple bilateral, Nails x 10 short and thick with improvement noted to right hallux nail, no webspace macerations present bilateral, no open lesions present bilateral, + minimal callus hyperkeratotic tissue present medial 1st toe on right>left and left plantar heel. No signs of infection bilateral.  Musculoskeletal:  Asymptomatic limited range of motion bilateral hallux with hammertoe boney deformities noted bilateral. Muscular strength within normal limits without pain on range of motion. No pain with calf compression bilateral.  Assessment and Plan:  Problem List Items Addressed This Visit   None   Visit Diagnoses    Onycholysis of toenail    -  Primary   Toe pain, right       Pain due to onychomycosis of toenails of both feet         -Examined patient.  -Discussed treatment options for painful mycotic nail with onycholysis and dry skin that's improving in nature  -Advised patient to use continue to use vicks daily may decrease to once  per week if there is skin irritation  -Advised patient to continue with daily skin emollients for plantar calluses and no additional charge today mechanically debrided nails x 10 using a sterile nail nipper and callus to first toes bilateral and plantar left heel without incident. -Patient to return in 3 months for nail care or sooner if symptoms worsen.  Asencion Islam, DPM

## 2020-03-21 ENCOUNTER — Ambulatory Visit (INDEPENDENT_AMBULATORY_CARE_PROVIDER_SITE_OTHER): Payer: Medicare Other

## 2020-03-21 DIAGNOSIS — E785 Hyperlipidemia, unspecified: Secondary | ICD-10-CM | POA: Diagnosis not present

## 2020-03-21 DIAGNOSIS — T8149XD Infection following a procedure, other surgical site, subsequent encounter: Secondary | ICD-10-CM | POA: Diagnosis not present

## 2020-03-21 DIAGNOSIS — Z96641 Presence of right artificial hip joint: Secondary | ICD-10-CM | POA: Diagnosis not present

## 2020-03-21 DIAGNOSIS — R001 Bradycardia, unspecified: Secondary | ICD-10-CM

## 2020-03-21 DIAGNOSIS — G43909 Migraine, unspecified, not intractable, without status migrainosus: Secondary | ICD-10-CM | POA: Diagnosis not present

## 2020-03-21 DIAGNOSIS — M810 Age-related osteoporosis without current pathological fracture: Secondary | ICD-10-CM | POA: Diagnosis not present

## 2020-03-21 DIAGNOSIS — T8189XD Other complications of procedures, not elsewhere classified, subsequent encounter: Secondary | ICD-10-CM | POA: Diagnosis not present

## 2020-03-21 DIAGNOSIS — K5790 Diverticulosis of intestine, part unspecified, without perforation or abscess without bleeding: Secondary | ICD-10-CM | POA: Diagnosis not present

## 2020-03-21 DIAGNOSIS — E039 Hypothyroidism, unspecified: Secondary | ICD-10-CM | POA: Diagnosis not present

## 2020-03-21 DIAGNOSIS — I341 Nonrheumatic mitral (valve) prolapse: Secondary | ICD-10-CM | POA: Diagnosis not present

## 2020-03-21 DIAGNOSIS — Z9181 History of falling: Secondary | ICD-10-CM | POA: Diagnosis not present

## 2020-03-21 DIAGNOSIS — K219 Gastro-esophageal reflux disease without esophagitis: Secondary | ICD-10-CM | POA: Diagnosis not present

## 2020-03-21 DIAGNOSIS — M1611 Unilateral primary osteoarthritis, right hip: Secondary | ICD-10-CM | POA: Diagnosis not present

## 2020-03-22 DIAGNOSIS — E039 Hypothyroidism, unspecified: Secondary | ICD-10-CM | POA: Diagnosis not present

## 2020-03-22 DIAGNOSIS — E785 Hyperlipidemia, unspecified: Secondary | ICD-10-CM | POA: Diagnosis not present

## 2020-03-22 DIAGNOSIS — M810 Age-related osteoporosis without current pathological fracture: Secondary | ICD-10-CM | POA: Diagnosis not present

## 2020-03-22 LAB — CUP PACEART REMOTE DEVICE CHECK
Battery Impedance: 678 Ohm
Battery Remaining Longevity: 79 mo
Battery Voltage: 2.78 V
Brady Statistic AP VP Percent: 0 %
Brady Statistic AP VS Percent: 100 %
Brady Statistic AS VP Percent: 0 %
Brady Statistic AS VS Percent: 0 %
Date Time Interrogation Session: 20220307155224
Implantable Lead Implant Date: 20050902
Implantable Lead Implant Date: 20060208
Implantable Lead Location: 753859
Implantable Lead Location: 753860
Implantable Lead Model: 4469
Implantable Lead Model: 4470
Implantable Lead Serial Number: 4470032319
Implantable Lead Serial Number: 449276
Implantable Pulse Generator Implant Date: 20150126
Lead Channel Impedance Value: 355 Ohm
Lead Channel Impedance Value: 661 Ohm
Lead Channel Pacing Threshold Amplitude: 0.75 V
Lead Channel Pacing Threshold Amplitude: 0.75 V
Lead Channel Pacing Threshold Pulse Width: 0.4 ms
Lead Channel Pacing Threshold Pulse Width: 0.4 ms
Lead Channel Setting Pacing Amplitude: 2 V
Lead Channel Setting Pacing Amplitude: 2.5 V
Lead Channel Setting Pacing Pulse Width: 0.4 ms
Lead Channel Setting Sensing Sensitivity: 2 mV

## 2020-03-24 DIAGNOSIS — Z85828 Personal history of other malignant neoplasm of skin: Secondary | ICD-10-CM | POA: Diagnosis not present

## 2020-03-29 DIAGNOSIS — D649 Anemia, unspecified: Secondary | ICD-10-CM | POA: Diagnosis not present

## 2020-03-29 DIAGNOSIS — R82998 Other abnormal findings in urine: Secondary | ICD-10-CM | POA: Diagnosis not present

## 2020-03-29 DIAGNOSIS — E039 Hypothyroidism, unspecified: Secondary | ICD-10-CM | POA: Diagnosis not present

## 2020-03-29 DIAGNOSIS — E785 Hyperlipidemia, unspecified: Secondary | ICD-10-CM | POA: Diagnosis not present

## 2020-03-29 DIAGNOSIS — Z0000 Encounter for general adult medical examination without abnormal findings: Secondary | ICD-10-CM | POA: Diagnosis not present

## 2020-03-29 NOTE — Progress Notes (Signed)
Remote pacemaker transmission.   

## 2020-04-22 ENCOUNTER — Encounter: Payer: Self-pay | Admitting: *Deleted

## 2020-04-25 ENCOUNTER — Ambulatory Visit: Payer: Medicare Other | Admitting: Internal Medicine

## 2020-04-25 ENCOUNTER — Encounter: Payer: Self-pay | Admitting: Internal Medicine

## 2020-04-25 ENCOUNTER — Other Ambulatory Visit: Payer: Self-pay

## 2020-04-25 VITALS — BP 100/58 | HR 61 | Ht 64.0 in | Wt 120.0 lb

## 2020-04-25 DIAGNOSIS — R001 Bradycardia, unspecified: Secondary | ICD-10-CM

## 2020-04-25 DIAGNOSIS — I10 Essential (primary) hypertension: Secondary | ICD-10-CM | POA: Diagnosis not present

## 2020-04-25 NOTE — Progress Notes (Deleted)
PCP: Rodrigo Ran, MD Primary Cardiologist: *** Primary EP:  Dr Mckinley Jewel Robin Atkins is a 87 y.o. female who presents today for routine electrophysiology followup.  Since last being seen in our clinic, the patient reports doing very well.  Today, she denies symptoms of palpitations, chest pain, shortness of breath,  lower extremity edema, dizziness, presyncope, or syncope.  The patient is otherwise without complaint today.   Past Medical History:  Diagnosis Date  . Benign recurrent vertigo   . Broken foot    Broken right foot and broken left knee in 2011  . Cataracts, bilateral   . DDD (degenerative disc disease), lumbar   . Diverticulosis   . GERD (gastroesophageal reflux disease)   . Hemorrhoids   . Hx of migraine headaches   . Hyperlipidemia   . Hypothyroidism   . Knee cap dislocation 2011   left  . Lumbar radiculopathy   . MVP (mitral valve prolapse)   . OP (osteoporosis)   . S/P cardiac pacemaker procedure    Original implant 1998 with generator change in 2005. She has had prior lead revision.   . Shingles   . SSS (sick sinus syndrome) (HCC)    s/p PTVP  . Wrist fracture    lt   Past Surgical History:  Procedure Laterality Date  . CARDIOVASCULAR STRESS TEST  07/10/2005   EF 78%, NO ISCHEMIA  . HEMORROIDECTOMY    . ORIF FEMUR FRACTURE Right 04/29/2012   Procedure: OPEN REDUCTION INTERNAL FIXATION (ORIF) DISTAL FEMUR FRACTURE;  Surgeon: Eulas Post, MD;  Location: [facility] OR;  Service: Orthopedics;  Laterality: Right;  biomet long condylar plate, flat jackson, cerclage wires, big c arm  . PACEMAKER GENERATOR CHANGE  02/09/13   MDT Adapta L gen change by Dr [name]  . PACEMAKER GENERATOR CHANGE N/A 02/09/2013   Procedure: PACEMAKER GENERATOR CHANGE;  Surgeon: Gardiner Rhyme, MD;  Location: [facility] CATH LAB;  Service: Cardiovascular;  Laterality: N/A;  . PACEMAKER INSERTION  1998/2005   TRANSVENOUS PACEMAKER  . TOTAL HIP ARTHROPLASTY     right  . US ECHOCARDIOGRAPHY   11/25/2008   EF 55-60%; mild to moderate AI    ROS- all systems are reviewed and negative except as per HPI above  Current Outpatient Medications  Medication Sig Dispense Refill  . levothyroxine (SYNTHROID) 75 MCG tablet Take 75 mcg by mouth every morning. Takes 6 days per week. On the 7th day does not take anything.    . Multiple Vitamins-Minerals (MULTIVITAMIN ADULT PO) Take 1 tablet by mouth daily.    . Omega-3 Fatty Acids (FISH OIL PO) Take 1 tablet by mouth daily.     . pravastatin (PRAVACHOL) 20 MG tablet Take 20 mg by mouth at bedtime.     Marland Kitchen UNABLE TO FIND OUTPATIENT PHYSICAL THERAPY   Diagnosis: Left shoulder glenohumeral osteoarthritis 1 Mutually Defined 0  . vitamin B-12 (CYANOCOBALAMIN) 1000 MCG tablet Take 1,000 mcg by mouth daily.     No current facility-administered medications for this visit.    Physical Exam: There were no vitals filed for this visit.  GEN- The patient is well appearing, alert and oriented x 3 today.   Head- normocephalic, atraumatic Eyes-  Sclera clear, conjunctiva pink Ears- hearing intact Oropharynx- clear Lungs- Clear to ausculation bilaterally, normal work of breathing Chest- pacemaker pocket is well healed Heart- Regular rate and rhythm, no murmurs, rubs or gallops, PMI not laterally displaced GI- soft, NT, ND, + BS Extremities- no  clubbing, cyanosis, or edema  Pacemaker interrogation- reviewed in detail today,  See PACEART report  ekg tracing ordered today is personally reviewed and shows apaced, first degree AV block  Assessment and Plan:  1. Symptomatic sinus bradycardia  Normal pacemaker function See Pace Art report No changes today she is not device dependant today  2. HTn Stable No change required today   Risks, benefits and potential toxicities for medications prescribed and/or refilled reviewed with patient today.   Hillis Range MD, [facility] 04/25/2020 3:47 PM

## 2020-04-25 NOTE — Progress Notes (Signed)
PCP: Rodrigo Ran, MD  Primary EP:  Dr Mckinley Jewel Robin Atkins is a 87 y.o. female who presents today for routine electrophysiology followup. She is accompanied by her husband. Since last being seen in our clinic, the patient reports doing reasonably well. Her systolic blood pressure ranged from 110-112. Today, she denies symptoms of palpitations, chest pain, shortness of breath, lower extremity edema, dizziness, presyncope, or syncope.  The patient is otherwise without complaint today.  She has fallen twice, she fell from walking on uneven pavement. She broke 6 bone. She also fell and notes having a femur fracture. She has been recovering and attending rehabilitation.     Past Medical History:  Diagnosis Date  . Benign recurrent vertigo   . Broken foot    Broken right foot and broken left knee in 2011  . Cataracts, bilateral   . DDD (degenerative disc disease), lumbar   . Diverticulosis   . GERD (gastroesophageal reflux disease)   . Hemorrhoids   . Hx of migraine headaches   . Hyperlipidemia   . Hypothyroidism   . Knee cap dislocation 2011   left  . Lumbar radiculopathy   . MVP (mitral valve prolapse)   . OP (osteoporosis)   . S/P cardiac pacemaker procedure    Original implant 1998 with generator change in 2005. She has had prior lead revision.   . Shingles   . SSS (sick sinus syndrome) (HCC)    s/p PTVP  . Wrist fracture    lt   Past Surgical History:  Procedure Laterality Date  . CARDIOVASCULAR STRESS TEST  07/10/2005   EF 78%, NO ISCHEMIA  . HEMORROIDECTOMY    . ORIF FEMUR FRACTURE Right 04/29/2012   Procedure: OPEN REDUCTION INTERNAL FIXATION (ORIF) DISTAL FEMUR FRACTURE;  Surgeon: Eulas Post, MD;  Location: [facility] OR;  Service: Orthopedics;  Laterality: Right;  biomet long condylar plate, flat jackson, cerclage wires, big c arm  . PACEMAKER GENERATOR CHANGE  02/09/13   MDT Adapta L gen change by Dr [name]  . PACEMAKER GENERATOR CHANGE N/A 02/09/2013   Procedure:  PACEMAKER GENERATOR CHANGE;  Surgeon: Gardiner Rhyme, MD;  Location: [facility] CATH LAB;  Service: Cardiovascular;  Laterality: N/A;  . PACEMAKER INSERTION  1998/2005   TRANSVENOUS PACEMAKER  . TOTAL HIP ARTHROPLASTY     right  . US ECHOCARDIOGRAPHY  11/25/2008   EF 55-60%; mild to moderate AI    ROS- all systems are reviewed and negative except as per HPI above  Current Outpatient Medications  Medication Sig Dispense Refill  . levothyroxine (SYNTHROID) 75 MCG tablet Take 75 mcg by mouth every morning. Takes 6 days per week. On the 7th day does not take anything.    . Multiple Vitamins-Minerals (MULTIVITAMIN ADULT PO) Take 1 tablet by mouth daily.    . pravastatin (PRAVACHOL) 20 MG tablet Take 20 mg by mouth at bedtime.     Marland Kitchen UNABLE TO FIND OUTPATIENT PHYSICAL THERAPY   Diagnosis: Left shoulder glenohumeral osteoarthritis 1 Mutually Defined 0  . vitamin B-12 (CYANOCOBALAMIN) 1000 MCG tablet Take 1,000 mcg by mouth daily.     No current facility-administered medications for this visit.    Physical Exam: Vitals:   04/25/20 1547  BP: (!) 100/58  Pulse: 61  SpO2: 98%  Weight: 120 lb (54.4 kg)  Height: 5\' 4"  (1.626 m)    GEN- The patient is elderly and thin appearing, alert and oriented x 3 today.   Head- normocephalic, atraumatic  Eyes-  Sclera clear, conjunctiva pink Ears- hearing intact Oropharynx- clear Lungs-   normal work of breathing Chest- pacemaker pocket is well healed Heart- Regular rate and rhythm  GI- soft  Extremities- no clubbing, cyanosis, or edema  Pacemaker interrogation- reviewed in detail today,  See PACEART report  ekg tracing ordered today is personally reviewed and shows apaced, first degree AV block  Assessment and Plan:  1. Symptomatic sinus bradycardia  Normal pacemaker function See Pace Art report No changes today she is not device dependant today  2. HTn Stable No change required today   Risks, benefits and potential toxicities for  medications prescribed and/or refilled reviewed with patient today.   Return in 1 year  I,Alexis Bryant,acting as a scribe for Hillis Range, MD.,have documented all relevant documentation on the behalf of Hillis Range, MD,as directed by  Hillis Range, MD while in the presence of Hillis Range, MD.  I, Hillis Range, MD, have reviewed all documentation for this visit. The documentation on 04/25/20 for the exam, diagnosis, procedures, and orders are all accurate and complete.   Hillis Range MD, [facility] 04/25/2020 3:49 PM

## 2020-04-25 NOTE — Patient Instructions (Addendum)
Medication Instructions:  Your physician recommends that you continue on your current medications as directed. Please refer to the Current Medication list given to you today.  Labwork: None ordered.  Testing/Procedures: None ordered.  Follow-Up: Your physician wants you to follow-up in: 05/01/20 at 3 pm with Hillis Range, MD   Remote monitoring is used to monitor your Pacemaker from home. This monitoring reduces the number of office visits required to check your device to one time per year. It allows [place] to keep an eye on the functioning of your device to ensure it is working properly. You are scheduled for a device check from home on 06/20/20. You may send your transmission at any time that day. If you have a wireless device, the transmission will be sent automatically. After your physician reviews your transmission, you will receive a postcard with your next transmission date.  Any Other Special Instructions Will Be Listed Below (If Applicable).  If you need a refill on your cardiac medications before your next appointment, please call your pharmacy.

## 2020-04-28 DIAGNOSIS — Z85828 Personal history of other malignant neoplasm of skin: Secondary | ICD-10-CM | POA: Diagnosis not present

## 2020-05-16 DIAGNOSIS — M898X5 Other specified disorders of bone, thigh: Secondary | ICD-10-CM | POA: Diagnosis not present

## 2020-05-16 DIAGNOSIS — H52223 Regular astigmatism, bilateral: Secondary | ICD-10-CM | POA: Diagnosis not present

## 2020-06-02 ENCOUNTER — Encounter: Payer: Self-pay | Admitting: Sports Medicine

## 2020-06-02 ENCOUNTER — Other Ambulatory Visit: Payer: Self-pay

## 2020-06-02 ENCOUNTER — Ambulatory Visit: Payer: Medicare Other | Admitting: Sports Medicine

## 2020-06-02 DIAGNOSIS — L601 Onycholysis: Secondary | ICD-10-CM | POA: Diagnosis not present

## 2020-06-02 DIAGNOSIS — M79674 Pain in right toe(s): Secondary | ICD-10-CM

## 2020-06-02 DIAGNOSIS — L853 Xerosis cutis: Secondary | ICD-10-CM

## 2020-06-02 DIAGNOSIS — B351 Tinea unguium: Secondary | ICD-10-CM | POA: Diagnosis not present

## 2020-06-02 DIAGNOSIS — M79675 Pain in left toe(s): Secondary | ICD-10-CM

## 2020-06-02 NOTE — Progress Notes (Signed)
Subjective: Robin Atkins is a 87 y.o. female patient seen today for follow-up evaluation for nail care and callus care.  Patient reports that she is doing good with no problems currently using Vicks VapoRub to her right great toenail.   No other pedal complaints at this time.   Patient Active Problem List   Diagnosis Date Noted  . Fall 06/09/2019  . Diverticula of intestine 06/05/2016  . DDD (degenerative disc disease), cervical 11/24/2015  . Unspecified inflammatory spondylopathy, cervical region (HCC) 11/24/2015  . Headache 11/10/2015  . Disequilibrium 11/07/2015  . Anemia 11/15/2014  . Encounter for general adult medical examination without abnormal findings 11/08/2014  . Shoulder joint pain 04/23/2014  . Chest pain 04/22/2014  . Left upper arm pain 04/22/2014  . Low back pain 12/14/2013  . Hypercalcemia 09/28/2013  . Abnormal gait 03/19/2013  . Hyperlipidemia 02/05/2013  . Fracture, femur, shaft (HCC) 04/29/2012  . Hypothyroidism 04/29/2012  . Other and unspecified hyperlipidemia 04/29/2012  . Underimmunization status 10/04/2011  . Cardiac pacemaker in situ 07/23/2011  . SICK SINUS SYNDROME 02/24/2010  . Hardening of the aorta (main artery of the heart) (HCC) 05/19/2009  . Age-related osteoporosis without current pathological fracture 03/29/2009  . Gastro-esophageal reflux disease without esophagitis 03/29/2009  . Migraine 03/29/2009  . Primary localized osteoarthritis of pelvic region and thigh 03/29/2009  . Mitral valve disorder 03/29/2009    Current Outpatient Medications on File Prior to Visit  Medication Sig Dispense Refill  . amoxicillin (AMOXIL) 875 MG tablet SMARTSIG:1 Tablet(s) By Mouth Every 12 Hours    . dexamethasone (DECADRON) 4 MG tablet Take 4 mg by mouth once.    Marland Kitchen levothyroxine (SYNTHROID) 75 MCG tablet Take 75 mcg by mouth every morning. Takes 6 days per week. On the 7th day does not take anything.    . Multiple Vitamins-Minerals (MULTIVITAMIN  ADULT PO) Take 1 tablet by mouth daily.    . mupirocin ointment (BACTROBAN) 2 % APPLY TO AFFECTED AREA 3 TIMES A DAY    . pravastatin (PRAVACHOL) 20 MG tablet Take 20 mg by mouth at bedtime.     Marland Kitchen UNABLE TO FIND OUTPATIENT PHYSICAL THERAPY   Diagnosis: Left shoulder glenohumeral osteoarthritis 1 Mutually Defined 0  . vitamin B-12 (CYANOCOBALAMIN) 1000 MCG tablet Take 1,000 mcg by mouth daily.     No current facility-administered medications on file prior to visit.    Allergies  Allergen Reactions  . Atorvastatin Other (See Comments)  . Ezetimibe Other (See Comments)  . Glucosamine Other (See Comments)  . Parathyroid Hormone (Recomb) Other (See Comments)  . Prednisone Other (See Comments)    Pt was able to take this recently and did well on it Other reaction(s): felt drained.she really does feel she cannot tolerate prednisone.    Objective: Physical Exam  General: Well developed, nourished, no acute distress, awake, alert and oriented x 3  Vascular: Dorsalis pedis artery 1/4 bilateral, Posterior tibial artery 0/4 bilateral, skin temperature warm to warm proximal to distal bilateral lower extremities, + varicosities, scant pedal hair present bilateral.  Neurological: Gross sensation present via light touch bilateral.   Dermatological: Skin is warm, dry, and supple bilateral, Nails x 10 are mildly elongated and thick with improvement noted to right hallux nail with the use of Vicks VapoRub, no webspace macerations present bilateral, no open lesions present bilateral, + minimal callus hyperkeratotic tissue present medial 1st toe on right>left and right plantar heel. No signs of infection bilateral.  Musculoskeletal:  Asymptomatic limited range  of motion bilateral hallux with hammertoe boney deformities noted bilateral. Muscular strength within normal limits without pain on range of motion. No pain with calf compression bilateral.  Assessment and Plan:  Problem List Items Addressed  This Visit   None   Visit Diagnoses    Pain due to onychomycosis of toenails of both feet    -  Primary   Relevant Medications   mupirocin ointment (BACTROBAN) 2 %   Onycholysis of toenail       Dry skin         -Examined patient.  -Discussed treatment options for painful mycotic nail with onycholysis and dry skin that's improving in nature  -Advised patient to use continue to use vicks vapor rub to her nails as tolerated -Advised patient to continue with daily skin emollients for plantar calluses and no additional charge today mechanically debrided nails x 10 using a sterile nail nipper and callus to first toes bilateral and plantar left and right heel without incident. -Patient to return in 3 months for nail/callus care or sooner if symptoms worsen.  Asencion Islam, DPM

## 2020-06-14 DIAGNOSIS — Z85828 Personal history of other malignant neoplasm of skin: Secondary | ICD-10-CM | POA: Diagnosis not present

## 2020-06-20 ENCOUNTER — Ambulatory Visit (INDEPENDENT_AMBULATORY_CARE_PROVIDER_SITE_OTHER): Payer: Medicare Other

## 2020-06-20 DIAGNOSIS — I495 Sick sinus syndrome: Secondary | ICD-10-CM | POA: Diagnosis not present

## 2020-06-21 LAB — CUP PACEART REMOTE DEVICE CHECK
Battery Impedance: 781 Ohm
Battery Remaining Longevity: 73 mo
Battery Voltage: 2.78 V
Brady Statistic AP VP Percent: 0 %
Brady Statistic AP VS Percent: 100 %
Brady Statistic AS VP Percent: 0 %
Brady Statistic AS VS Percent: 0 %
Date Time Interrogation Session: 20220606153332
Implantable Lead Implant Date: 20050902
Implantable Lead Implant Date: 20060208
Implantable Lead Location: 753859
Implantable Lead Location: 753860
Implantable Lead Model: 4469
Implantable Lead Model: 4470
Implantable Lead Serial Number: 4470032319
Implantable Lead Serial Number: 449276
Implantable Pulse Generator Implant Date: 20150126
Lead Channel Impedance Value: 350 Ohm
Lead Channel Impedance Value: 606 Ohm
Lead Channel Pacing Threshold Amplitude: 0.75 V
Lead Channel Pacing Threshold Amplitude: 0.875 V
Lead Channel Pacing Threshold Pulse Width: 0.4 ms
Lead Channel Pacing Threshold Pulse Width: 0.4 ms
Lead Channel Setting Pacing Amplitude: 2 V
Lead Channel Setting Pacing Amplitude: 2.5 V
Lead Channel Setting Pacing Pulse Width: 0.4 ms
Lead Channel Setting Sensing Sensitivity: 2 mV

## 2020-07-12 NOTE — Progress Notes (Signed)
Remote pacemaker transmission.   

## 2020-07-21 ENCOUNTER — Other Ambulatory Visit (HOSPITAL_COMMUNITY): Payer: Self-pay | Admitting: *Deleted

## 2020-07-22 ENCOUNTER — Other Ambulatory Visit: Payer: Self-pay

## 2020-07-22 ENCOUNTER — Ambulatory Visit (HOSPITAL_COMMUNITY)
Admission: RE | Admit: 2020-07-22 | Discharge: 2020-07-22 | Disposition: A | Discharge status: Home / Self Care | Payer: Medicare Other | Source: Ambulatory Visit | Attending: Internal Medicine | Admitting: Internal Medicine

## 2020-07-22 DIAGNOSIS — M810 Age-related osteoporosis without current pathological fracture: Secondary | ICD-10-CM | POA: Insufficient documentation

## 2020-07-22 MED ORDER — DENOSUMAB 60 MG/ML ~~LOC~~ SOSY
60.0000 mg | PREFILLED_SYRINGE | Freq: Once | SUBCUTANEOUS | Status: AC
  Administered 2020-07-22: 60 mg via SUBCUTANEOUS

## 2020-07-22 MED ORDER — DENOSUMAB 60 MG/ML ~~LOC~~ SOSY
PREFILLED_SYRINGE | SUBCUTANEOUS | Status: AC
  Filled 2020-07-22: qty 1

## 2020-07-26 DIAGNOSIS — L858 Other specified epidermal thickening: Secondary | ICD-10-CM | POA: Diagnosis not present

## 2020-07-26 DIAGNOSIS — D485 Neoplasm of uncertain behavior of skin: Secondary | ICD-10-CM | POA: Diagnosis not present

## 2020-07-26 DIAGNOSIS — Z85828 Personal history of other malignant neoplasm of skin: Secondary | ICD-10-CM | POA: Diagnosis not present

## 2020-07-26 DIAGNOSIS — C44722 Squamous cell carcinoma of skin of right lower limb, including hip: Secondary | ICD-10-CM | POA: Diagnosis not present

## 2020-08-29 DIAGNOSIS — Z85828 Personal history of other malignant neoplasm of skin: Secondary | ICD-10-CM | POA: Diagnosis not present

## 2020-09-08 ENCOUNTER — Other Ambulatory Visit: Payer: Self-pay

## 2020-09-08 ENCOUNTER — Encounter: Payer: Self-pay | Admitting: Sports Medicine

## 2020-09-08 ENCOUNTER — Ambulatory Visit (INDEPENDENT_AMBULATORY_CARE_PROVIDER_SITE_OTHER): Payer: Medicare Other | Admitting: Sports Medicine

## 2020-09-08 DIAGNOSIS — M79675 Pain in left toe(s): Secondary | ICD-10-CM | POA: Diagnosis not present

## 2020-09-08 DIAGNOSIS — B351 Tinea unguium: Secondary | ICD-10-CM | POA: Diagnosis not present

## 2020-09-08 DIAGNOSIS — L601 Onycholysis: Secondary | ICD-10-CM

## 2020-09-08 DIAGNOSIS — L853 Xerosis cutis: Secondary | ICD-10-CM | POA: Diagnosis not present

## 2020-09-08 DIAGNOSIS — M79674 Pain in right toe(s): Secondary | ICD-10-CM

## 2020-09-08 NOTE — Progress Notes (Signed)
Subjective: Robin Atkins is a 87 y.o. female patient seen today for follow-up evaluation for nail care and callus care.  Patient reports that she is doing good no other pedal complaints at this time.   Patient Active Problem List   Diagnosis Date Noted   Fall 06/09/2019   Diverticula of intestine 06/05/2016   DDD (degenerative disc disease), cervical 11/24/2015   Unspecified inflammatory spondylopathy, cervical region (HCC) 11/24/2015   Headache 11/10/2015   Disequilibrium 11/07/2015   Anemia 11/15/2014   Encounter for general adult medical examination without abnormal findings 11/08/2014   Shoulder joint pain 04/23/2014   Chest pain 04/22/2014   Left upper arm pain 04/22/2014   Low back pain 12/14/2013   Hypercalcemia 09/28/2013   Abnormal gait 03/19/2013   Hyperlipidemia 02/05/2013   Fracture, femur, shaft (HCC) 04/29/2012   Hypothyroidism 04/29/2012   Other and unspecified hyperlipidemia 04/29/2012   Underimmunization status 10/04/2011   Cardiac pacemaker in situ 07/23/2011   SICK SINUS SYNDROME 02/24/2010   Hardening of the aorta (main artery of the heart) (HCC) 05/19/2009   Age-related osteoporosis without current pathological fracture 03/29/2009   Gastro-esophageal reflux disease without esophagitis 03/29/2009   Migraine 03/29/2009   Primary localized osteoarthritis of pelvic region and thigh 03/29/2009   Mitral valve disorder 03/29/2009    Current Outpatient Medications on File Prior to Visit  Medication Sig Dispense Refill   amoxicillin (AMOXIL) 875 MG tablet SMARTSIG:1 Tablet(s) By Mouth Every 12 Hours     dexamethasone (DECADRON) 4 MG tablet Take 4 mg by mouth once.     levothyroxine (SYNTHROID) 75 MCG tablet Take 75 mcg by mouth every morning. Takes 6 days per week. On the 7th day does not take anything.     Multiple Vitamins-Minerals (MULTIVITAMIN ADULT PO) Take 1 tablet by mouth daily.     mupirocin ointment (BACTROBAN) 2 % APPLY TO AFFECTED AREA 3 TIMES  A DAY     pravastatin (PRAVACHOL) 20 MG tablet Take 20 mg by mouth at bedtime.      UNABLE TO FIND OUTPATIENT PHYSICAL THERAPY   Diagnosis: Left shoulder glenohumeral osteoarthritis 1 Mutually Defined 0   vitamin B-12 (CYANOCOBALAMIN) 1000 MCG tablet Take 1,000 mcg by mouth daily.     No current facility-administered medications on file prior to visit.    Allergies  Allergen Reactions   Alendronate Sodium Other (See Comments)   Atorvastatin Other (See Comments)   Ezetimibe Other (See Comments)   Glucosamine Other (See Comments)   Parathyroid Hormone (Recomb) Other (See Comments)   Povidone Iodine Other (See Comments)   Quinolones Other (See Comments)   Simvastatin Other (See Comments)   Prednisone Other (See Comments)    Pt was able to take this recently and did well on it Other reaction(s): felt drained.she really does feel she cannot tolerate prednisone.    Objective: Physical Exam  General: Well developed, nourished, no acute distress, awake, alert and oriented x 3  Vascular: Dorsalis pedis artery 1/4 bilateral, Posterior tibial artery 0/4 bilateral, skin temperature warm to warm proximal to distal bilateral lower extremities, + varicosities, scant pedal hair present bilateral.  Neurological: Gross sensation present via light touch bilateral.   Dermatological: Skin is warm, dry, and supple bilateral, Nails x 10 are mildly elongated and thick with improvement noted to right hallux nail with the use of Vicks VapoRub, no webspace macerations present bilateral, no open lesions present bilateral, + minimal callus hyperkeratotic tissue present medial 1st toe on right>left and right plantar  heel. No signs of infection bilateral.  Musculoskeletal:  Asymptomatic limited range of motion bilateral hallux with hammertoe boney deformities noted bilateral. Muscular strength within normal limits without pain on range of motion. No pain with calf compression bilateral.  Assessment and Plan:   Problem List Items Addressed This Visit   None Visit Diagnoses     Pain due to onychomycosis of toenails of both feet    -  Primary   Onycholysis of toenail       Dry skin          -Examined patient.  -Re-Discussed treatment options for painful mycotic nail with onycholysis and dry skin that's improving in nature  -Advised patient to use continue to use vicks vapor rub to her nails as tolerated -Advised patient to continue with daily skin emollients for plantar calluses and no additional charge today mechanically debrided nails x 10 using a sterile nail nipper and callus to first toes bilateral and plantar left and right heel without incident. -Patient to return in 3 months for nail/callus care or sooner if symptoms worsen.  Asencion Islam, DPM

## 2020-09-20 ENCOUNTER — Ambulatory Visit (INDEPENDENT_AMBULATORY_CARE_PROVIDER_SITE_OTHER): Payer: Medicare Other

## 2020-09-20 DIAGNOSIS — I495 Sick sinus syndrome: Secondary | ICD-10-CM

## 2020-09-20 LAB — CUP PACEART REMOTE DEVICE CHECK
Battery Impedance: 831 Ohm
Battery Remaining Longevity: 71 mo
Battery Voltage: 2.78 V
Brady Statistic AP VP Percent: 0 %
Brady Statistic AP VS Percent: 100 %
Brady Statistic AS VP Percent: 0 %
Brady Statistic AS VS Percent: 0 %
Date Time Interrogation Session: 20220906141204
Implantable Lead Implant Date: 20050902
Implantable Lead Implant Date: 20060208
Implantable Lead Location: 753859
Implantable Lead Location: 753860
Implantable Lead Model: 4469
Implantable Lead Model: 4470
Implantable Lead Serial Number: 4470032319
Implantable Lead Serial Number: 449276
Implantable Pulse Generator Implant Date: 20150126
Lead Channel Impedance Value: 351 Ohm
Lead Channel Impedance Value: 683 Ohm
Lead Channel Pacing Threshold Amplitude: 0.75 V
Lead Channel Pacing Threshold Amplitude: 0.875 V
Lead Channel Pacing Threshold Pulse Width: 0.4 ms
Lead Channel Pacing Threshold Pulse Width: 0.4 ms
Lead Channel Setting Pacing Amplitude: 2 V
Lead Channel Setting Pacing Amplitude: 2.5 V
Lead Channel Setting Pacing Pulse Width: 0.4 ms
Lead Channel Setting Sensing Sensitivity: 2.8 mV

## 2020-09-29 NOTE — Progress Notes (Signed)
Remote pacemaker transmission.   

## 2020-10-06 DIAGNOSIS — D649 Anemia, unspecified: Secondary | ICD-10-CM | POA: Diagnosis not present

## 2020-10-06 DIAGNOSIS — E039 Hypothyroidism, unspecified: Secondary | ICD-10-CM | POA: Diagnosis not present

## 2020-10-06 DIAGNOSIS — E785 Hyperlipidemia, unspecified: Secondary | ICD-10-CM | POA: Diagnosis not present

## 2020-10-17 DIAGNOSIS — Z1231 Encounter for screening mammogram for malignant neoplasm of breast: Secondary | ICD-10-CM | POA: Diagnosis not present

## 2020-12-15 ENCOUNTER — Ambulatory Visit: Payer: Medicare Other | Admitting: Sports Medicine

## 2020-12-15 ENCOUNTER — Other Ambulatory Visit: Payer: Self-pay

## 2020-12-15 DIAGNOSIS — B351 Tinea unguium: Secondary | ICD-10-CM

## 2020-12-15 DIAGNOSIS — L853 Xerosis cutis: Secondary | ICD-10-CM

## 2020-12-15 DIAGNOSIS — L84 Corns and callosities: Secondary | ICD-10-CM | POA: Diagnosis not present

## 2020-12-15 DIAGNOSIS — L601 Onycholysis: Secondary | ICD-10-CM

## 2020-12-15 DIAGNOSIS — M79675 Pain in left toe(s): Secondary | ICD-10-CM

## 2020-12-15 DIAGNOSIS — M79674 Pain in right toe(s): Secondary | ICD-10-CM

## 2020-12-15 NOTE — Progress Notes (Signed)
Subjective: JENNAFER GLADUE is a 87 y.o. female patient seen today for follow-up evaluation for nail care and callus care.  Patient reports that she is doing good no other pedal complaints at this time except dry skin.   Denies any changes with medication or health history since last encounter.  Patient Active Problem List   Diagnosis Date Noted   Fall 06/09/2019   Diverticula of intestine 06/05/2016   DDD (degenerative disc disease), cervical 11/24/2015   Unspecified inflammatory spondylopathy, cervical region (HCC) 11/24/2015   Headache 11/10/2015   Disequilibrium 11/07/2015   Anemia 11/15/2014   Encounter for general adult medical examination without abnormal findings 11/08/2014   Shoulder joint pain 04/23/2014   Chest pain 04/22/2014   Left upper arm pain 04/22/2014   Low back pain 12/14/2013   Hypercalcemia 09/28/2013   Abnormal gait 03/19/2013   Hyperlipidemia 02/05/2013   Fracture, femur, shaft (HCC) 04/29/2012   Hypothyroidism 04/29/2012   Other and unspecified hyperlipidemia 04/29/2012   Underimmunization status 10/04/2011   Cardiac pacemaker in situ 07/23/2011   SICK SINUS SYNDROME 02/24/2010   Hardening of the aorta (main artery of the heart) (HCC) 05/19/2009   Age-related osteoporosis without current pathological fracture 03/29/2009   Gastro-esophageal reflux disease without esophagitis 03/29/2009   Migraine 03/29/2009   Primary localized osteoarthritis of pelvic region and thigh 03/29/2009   Mitral valve disorder 03/29/2009    Current Outpatient Medications on File Prior to Visit  Medication Sig Dispense Refill   amoxicillin (AMOXIL) 875 MG tablet SMARTSIG:1 Tablet(s) By Mouth Every 12 Hours     dexamethasone (DECADRON) 4 MG tablet Take 4 mg by mouth once.     levothyroxine (SYNTHROID) 75 MCG tablet Take 75 mcg by mouth every morning. Takes 6 days per week. On the 7th day does not take anything.     Multiple Vitamins-Minerals (MULTIVITAMIN ADULT PO) Take 1  tablet by mouth daily.     mupirocin ointment (BACTROBAN) 2 % APPLY TO AFFECTED AREA 3 TIMES A DAY     pravastatin (PRAVACHOL) 20 MG tablet Take 20 mg by mouth at bedtime.      UNABLE TO FIND OUTPATIENT PHYSICAL THERAPY   Diagnosis: Left shoulder glenohumeral osteoarthritis 1 Mutually Defined 0   vitamin B-12 (CYANOCOBALAMIN) 1000 MCG tablet Take 1,000 mcg by mouth daily.     No current facility-administered medications on file prior to visit.    Allergies  Allergen Reactions   Alendronate Sodium Other (See Comments)   Atorvastatin Other (See Comments)   Ezetimibe Other (See Comments)   Glucosamine Other (See Comments)   Parathyroid Hormone (Recomb) Other (See Comments)   Povidone Iodine Other (See Comments)   Quinolones Other (See Comments)   Simvastatin Other (See Comments)   Prednisone Other (See Comments)    Pt was able to take this recently and did well on it Other reaction(s): felt drained.she really does feel she cannot tolerate prednisone.    Objective: Physical Exam  General: Well developed, nourished, no acute distress, awake, alert and oriented x 3  Vascular: Dorsalis pedis artery 1/4 bilateral, Posterior tibial artery 0/4 bilateral, skin temperature warm to warm proximal to distal bilateral lower extremities, + varicosities, scant pedal hair present bilateral.  Neurological: Gross sensation present via light touch bilateral.   Dermatological: Skin is warm, dry, and supple bilateral, Nails x 10 are mildly elongated and thick with improvement noted to right hallux nail with the use of Vicks VapoRub, no webspace macerations present bilateral, no open lesions present  bilateral, + minimal callus hyperkeratotic tissue present medial 1st toe on right>left and right plantar heel. No signs of infection bilateral.  Musculoskeletal:  Asymptomatic limited range of motion bilateral hallux with hammertoe boney deformities noted bilateral. Muscular strength within normal limits  without pain on range of motion. No pain with calf compression bilateral.  Assessment and Plan:  Problem List Items Addressed This Visit   None Visit Diagnoses     Pain due to onychomycosis of toenails of both feet    -  Primary   Onycholysis of toenail       Callus       Dry skin            -Examined patient.  -Re-Discussed treatment options for painful mycotic nail with onycholysis and dry skin that's improving in nature  -Advised patient to use continue to use vicks vapor rub to her nails as tolerated -Advised patient to continue with daily skin emollients for plantar calluses and no additional charge today mechanically debrided nails x 10 using a sterile nail nipper and callus to first toes bilateral and plantar left and right heel without incident. -Dispensed sample of foot miracle cream for patient to use as directed -Patient to return in 3 months for nail/callus care or sooner if symptoms worsen.  Asencion Islam, DPM

## 2020-12-19 ENCOUNTER — Ambulatory Visit (INDEPENDENT_AMBULATORY_CARE_PROVIDER_SITE_OTHER): Payer: Medicare Other

## 2020-12-19 DIAGNOSIS — I495 Sick sinus syndrome: Secondary | ICD-10-CM

## 2020-12-20 LAB — CUP PACEART REMOTE DEVICE CHECK
Battery Impedance: 936 Ohm
Battery Remaining Longevity: 66 mo
Battery Voltage: 2.78 V
Brady Statistic AP VP Percent: 0 %
Brady Statistic AP VS Percent: 100 %
Brady Statistic AS VP Percent: 0 %
Brady Statistic AS VS Percent: 0 %
Date Time Interrogation Session: 20221206142443
Implantable Lead Implant Date: 20050902
Implantable Lead Implant Date: 20060208
Implantable Lead Location: 753859
Implantable Lead Location: 753860
Implantable Lead Model: 4469
Implantable Lead Model: 4470
Implantable Lead Serial Number: 4470032319
Implantable Lead Serial Number: 449276
Implantable Pulse Generator Implant Date: 20150126
Lead Channel Impedance Value: 350 Ohm
Lead Channel Impedance Value: 638 Ohm
Lead Channel Pacing Threshold Amplitude: 0.75 V
Lead Channel Pacing Threshold Amplitude: 0.875 V
Lead Channel Pacing Threshold Pulse Width: 0.4 ms
Lead Channel Pacing Threshold Pulse Width: 0.4 ms
Lead Channel Setting Pacing Amplitude: 2 V
Lead Channel Setting Pacing Amplitude: 2.5 V
Lead Channel Setting Pacing Pulse Width: 0.4 ms
Lead Channel Setting Sensing Sensitivity: 2 mV

## 2020-12-28 NOTE — Progress Notes (Signed)
Remote pacemaker transmission.   

## 2021-01-13 DIAGNOSIS — M545 Low back pain, unspecified: Secondary | ICD-10-CM | POA: Diagnosis not present

## 2021-01-13 DIAGNOSIS — M48061 Spinal stenosis, lumbar region without neurogenic claudication: Secondary | ICD-10-CM | POA: Diagnosis not present

## 2021-01-13 DIAGNOSIS — M898X5 Other specified disorders of bone, thigh: Secondary | ICD-10-CM | POA: Diagnosis not present

## 2021-02-01 ENCOUNTER — Other Ambulatory Visit (HOSPITAL_COMMUNITY): Payer: Self-pay

## 2021-02-02 ENCOUNTER — Other Ambulatory Visit: Payer: Self-pay

## 2021-02-02 ENCOUNTER — Ambulatory Visit (HOSPITAL_COMMUNITY)
Admission: RE | Admit: 2021-02-02 | Discharge: 2021-02-02 | Disposition: A | Discharge status: Home / Self Care | Payer: Medicare Other | Source: Ambulatory Visit | Attending: Internal Medicine | Admitting: Internal Medicine

## 2021-02-02 DIAGNOSIS — M810 Age-related osteoporosis without current pathological fracture: Secondary | ICD-10-CM | POA: Diagnosis not present

## 2021-02-02 MED ORDER — DENOSUMAB 60 MG/ML ~~LOC~~ SOSY
60.0000 mg | PREFILLED_SYRINGE | Freq: Once | SUBCUTANEOUS | Status: AC
  Administered 2021-02-02: 60 mg via SUBCUTANEOUS

## 2021-02-02 MED ORDER — DENOSUMAB 60 MG/ML ~~LOC~~ SOSY
PREFILLED_SYRINGE | SUBCUTANEOUS | Status: AC
  Filled 2021-02-02: qty 1

## 2021-02-10 DIAGNOSIS — M48061 Spinal stenosis, lumbar region without neurogenic claudication: Secondary | ICD-10-CM | POA: Diagnosis not present

## 2021-03-16 ENCOUNTER — Ambulatory Visit: Payer: Medicare Other | Admitting: Sports Medicine

## 2021-03-16 ENCOUNTER — Other Ambulatory Visit: Payer: Self-pay

## 2021-03-16 DIAGNOSIS — I739 Peripheral vascular disease, unspecified: Secondary | ICD-10-CM | POA: Diagnosis not present

## 2021-03-16 DIAGNOSIS — M79674 Pain in right toe(s): Secondary | ICD-10-CM

## 2021-03-16 DIAGNOSIS — M79675 Pain in left toe(s): Secondary | ICD-10-CM

## 2021-03-16 DIAGNOSIS — B351 Tinea unguium: Secondary | ICD-10-CM

## 2021-03-16 DIAGNOSIS — L853 Xerosis cutis: Secondary | ICD-10-CM | POA: Diagnosis not present

## 2021-03-16 DIAGNOSIS — L601 Onycholysis: Secondary | ICD-10-CM | POA: Diagnosis not present

## 2021-03-16 NOTE — Progress Notes (Signed)
Subjective: ?Robin Atkins is a 88 y.o. female patient seen today for follow-up evaluation for nail care and callus care.  Patient reports that she wants to try to get her nails polished and is wondering if that is okay. ?Denies any changes with medication or health history since last encounter.  No other pedal complaints noted. ? ?Patient Active Problem List  ? Diagnosis Date Noted  ? Fall 06/09/2019  ? Diverticula of intestine 06/05/2016  ? DDD (degenerative disc disease), cervical 11/24/2015  ? Unspecified inflammatory spondylopathy, cervical region (HCC) 11/24/2015  ? Headache 11/10/2015  ? Disequilibrium 11/07/2015  ? Anemia 11/15/2014  ? Encounter for general adult medical examination without abnormal findings 11/08/2014  ? Shoulder joint pain 04/23/2014  ? Chest pain 04/22/2014  ? Left upper arm pain 04/22/2014  ? Low back pain 12/14/2013  ? Hypercalcemia 09/28/2013  ? Abnormal gait 03/19/2013  ? Hyperlipidemia 02/05/2013  ? Fracture, femur, shaft (HCC) 04/29/2012  ? Hypothyroidism 04/29/2012  ? Other and unspecified hyperlipidemia 04/29/2012  ? Underimmunization status 10/04/2011  ? Cardiac pacemaker in situ 07/23/2011  ? SICK SINUS SYNDROME 02/24/2010  ? Hardening of the aorta (main artery of the heart) (HCC) 05/19/2009  ? Age-related osteoporosis without current pathological fracture 03/29/2009  ? Gastro-esophageal reflux disease without esophagitis 03/29/2009  ? Migraine 03/29/2009  ? Primary localized osteoarthritis of pelvic region and thigh 03/29/2009  ? Mitral valve disorder 03/29/2009  ? ? ?Current Outpatient Medications on File Prior to Visit  ?Medication Sig Dispense Refill  ? amoxicillin (AMOXIL) 875 MG tablet SMARTSIG:1 Tablet(s) By Mouth Every 12 Hours    ? dexamethasone (DECADRON) 4 MG tablet Take 4 mg by mouth once.    ? levothyroxine (SYNTHROID) 75 MCG tablet Take 75 mcg by mouth every morning. Takes 6 days per week. On the 7th day does not take anything.    ? Multiple  Vitamins-Minerals (MULTIVITAMIN ADULT PO) Take 1 tablet by mouth daily.    ? mupirocin ointment (BACTROBAN) 2 % APPLY TO AFFECTED AREA 3 TIMES A DAY    ? pravastatin (PRAVACHOL) 20 MG tablet Take 20 mg by mouth at bedtime.     ? UNABLE TO FIND OUTPATIENT PHYSICAL THERAPY ? ? ?Diagnosis: Left shoulder glenohumeral osteoarthritis 1 Mutually Defined 0  ? vitamin B-12 (CYANOCOBALAMIN) 1000 MCG tablet Take 1,000 mcg by mouth daily.    ? ?No current facility-administered medications on file prior to visit.  ? ? ?Allergies  ?Allergen Reactions  ? Alendronate Sodium Other (See Comments)  ? Atorvastatin Other (See Comments)  ? Ezetimibe Other (See Comments)  ? Glucosamine Other (See Comments)  ? Parathyroid Hormone (Recomb) Other (See Comments)  ? Povidone Iodine Other (See Comments)  ? Quinolones Other (See Comments)  ? Simvastatin Other (See Comments)  ? Prednisone Other (See Comments)  ?  Pt was able to take this recently and did well on it ?Other reaction(s): felt drained.she really does feel she cannot tolerate prednisone.  ? ? ?Objective: ?Physical Exam ? ?General: Well developed, nourished, no acute distress, awake, alert and oriented x 3 ? ?Vascular: Dorsalis pedis artery 1/4 bilateral, Posterior tibial artery 0/4 bilateral, skin temperature warm to warm proximal to distal bilateral lower extremities, + varicosities, scant pedal hair present bilateral. ? ?Neurological: Gross sensation present via light touch bilateral.  ? ?Dermatological: Skin is warm, dry, and supple bilateral, Nails x 10 are mildly elongated and thick with improvement noted to right hallux nail with the use of Vicks VapoRub, no webspace macerations  present bilateral, no open lesions present bilateral, + minimal callus hyperkeratotic tissue present medial 1st toe on right>left and right plantar heel im improving. No signs of infection bilateral. ? ?Musculoskeletal:  Asymptomatic limited range of motion bilateral hallux with hammertoe boney  deformities noted bilateral. Muscular strength within normal limits without pain on range of motion. No pain with calf compression bilateral. ? ?Assessment and Plan:  ?Problem List Items Addressed This Visit   ?None ?Visit Diagnoses   ? ? Pain due to onychomycosis of toenails of both feet    -  Primary  ? Onycholysis of toenail      ? Dry skin      ? PVD (peripheral vascular disease) (HCC)      ? ?  ? ? ? ?-Examined patient.  ?-Re-Discussed treatment options for painful mycotic nail with onycholysis and dry skin that's improving in nature  ?-Advised patient to continue with foot miracle cream as previously provided for callus skin ?-Advised patient to use continue to use vicks vapor rub to her nails as tolerated ?-Advised patient may try nail remedy antifungal nail polish  ?and no additional charge today mechanically debrided nails x 10 using a sterile nail nipper  ?-Return to office in 3 months for routine nail care or sooner if problems or issues arise.   ? ?Asencion Islam, DPM ?   ?

## 2021-03-16 NOTE — Patient Instructions (Signed)
Dr. Eloy End for nail care in the future ?Nail remedy antifungal nail polish ?

## 2021-03-20 ENCOUNTER — Ambulatory Visit (INDEPENDENT_AMBULATORY_CARE_PROVIDER_SITE_OTHER): Payer: Medicare Other

## 2021-03-20 DIAGNOSIS — I495 Sick sinus syndrome: Secondary | ICD-10-CM | POA: Diagnosis not present

## 2021-03-21 LAB — CUP PACEART REMOTE DEVICE CHECK
Battery Impedance: 960 Ohm
Battery Remaining Longevity: 65 mo
Battery Voltage: 2.78 V
Brady Statistic AP VP Percent: 0 %
Brady Statistic AP VS Percent: 100 %
Brady Statistic AS VP Percent: 0 %
Brady Statistic AS VS Percent: 0 %
Date Time Interrogation Session: 20230306172627
Implantable Lead Implant Date: 20050902
Implantable Lead Implant Date: 20060208
Implantable Lead Location: 753859
Implantable Lead Location: 753860
Implantable Lead Model: 4469
Implantable Lead Model: 4470
Implantable Lead Serial Number: 4470032319
Implantable Lead Serial Number: 449276
Implantable Pulse Generator Implant Date: 20150126
Lead Channel Impedance Value: 371 Ohm
Lead Channel Impedance Value: 661 Ohm
Lead Channel Pacing Threshold Amplitude: 0.75 V
Lead Channel Pacing Threshold Amplitude: 0.875 V
Lead Channel Pacing Threshold Pulse Width: 0.4 ms
Lead Channel Pacing Threshold Pulse Width: 0.4 ms
Lead Channel Setting Pacing Amplitude: 2 V
Lead Channel Setting Pacing Amplitude: 2.5 V
Lead Channel Setting Pacing Pulse Width: 0.4 ms
Lead Channel Setting Sensing Sensitivity: 2.8 mV

## 2021-03-30 NOTE — Progress Notes (Signed)
Remote pacemaker transmission.   

## 2021-04-28 ENCOUNTER — Ambulatory Visit (INDEPENDENT_AMBULATORY_CARE_PROVIDER_SITE_OTHER): Payer: Medicare Other | Admitting: Internal Medicine

## 2021-04-28 ENCOUNTER — Encounter (HOSPITAL_BASED_OUTPATIENT_CLINIC_OR_DEPARTMENT_OTHER): Payer: Self-pay | Admitting: Internal Medicine

## 2021-04-28 VITALS — BP 130/82 | HR 72 | Ht 64.0 in | Wt 118.0 lb

## 2021-04-28 DIAGNOSIS — Z950 Presence of cardiac pacemaker: Secondary | ICD-10-CM | POA: Diagnosis not present

## 2021-04-28 DIAGNOSIS — I495 Sick sinus syndrome: Secondary | ICD-10-CM

## 2021-04-28 NOTE — Patient Instructions (Addendum)
Medication Instructions:  ?Your physician recommends that you continue on your current medications as directed. Please refer to the Current Medication list given to you today. ? ?Labwork: ?None ordered. ? ?Testing/Procedures: ?None ordered. ? ?Follow-Up: ?Your physician wants you to follow-up in: one year with Hillis Range, MD ?You will receive a reminder letter in the mail two months in advance. If you don't receive a letter, please call our office to schedule the follow-up appointment. ? ?Remote monitoring is used to monitor your Pacemaker from home. This monitoring reduces the number of office visits required to check your device to one time per year. It allows [place] to keep an eye on the functioning of your device to ensure it is working properly. You are scheduled for a device check from home on 06/29/2021. You may send your transmission at any time that day. If you have a wireless device, the transmission will be sent automatically. After your physician reviews your transmission, you will receive a postcard with your next transmission date. ? ?Any Other Special Instructions Will Be Listed Below (If Applicable). ? ?If you need a refill on your cardiac medications before your next appointment, please call your pharmacy.  ? ?Important Information About Sugar ? ? ? ? ? ? ? ?

## 2021-04-28 NOTE — Progress Notes (Signed)
? ? ? ?PCP: Rodrigo Ran, MD ?  ?Primary EP:  Dr [name] ? ?Robin Atkins is a 88 y.o. female who presents today for routine electrophysiology followup.  Since last being seen in our clinic, the patient reports doing very well.  Today, she denies symptoms of palpitations, chest pain, shortness of breath,  lower extremity edema, dizziness, presyncope, or syncope.  The patient is otherwise without complaint today.  ? ?Past Medical History:  ?Diagnosis Date  ? Benign recurrent vertigo   ? Broken foot   ? Broken right foot and broken left knee in 2011  ? Cataracts, bilateral   ? DDD (degenerative disc disease), lumbar   ? Diverticulosis   ? GERD (gastroesophageal reflux disease)   ? Hemorrhoids   ? Hx of migraine headaches   ? Hyperlipidemia   ? Hypothyroidism   ? Knee cap dislocation 2011  ? left  ? Lumbar radiculopathy   ? MVP (mitral valve prolapse)   ? OP (osteoporosis)   ? S/P cardiac pacemaker procedure   ? Original implant 1998 with generator change in 2005. She has had prior lead revision.   ? Shingles   ? SSS (sick sinus syndrome) (HCC)   ? s/p PTVP  ? Wrist fracture   ? lt  ? ?Past Surgical History:  ?Procedure Laterality Date  ? CARDIOVASCULAR STRESS TEST  07/10/2005  ? EF 78%, NO ISCHEMIA  ? HEMORROIDECTOMY    ? ORIF FEMUR FRACTURE Right 04/29/2012  ? Procedure: OPEN REDUCTION INTERNAL FIXATION (ORIF) DISTAL FEMUR FRACTURE;  Surgeon: Eulas Post, MD;  Location: [facility] OR;  Service: Orthopedics;  Laterality: Right;  biomet long condylar plate, flat jackson, cerclage wires, big c arm  ? PACEMAKER GENERATOR CHANGE  02/09/13  ? MDT Adapta L gen change by Dr [name]  ? PACEMAKER GENERATOR CHANGE N/A 02/09/2013  ? Procedure: PACEMAKER GENERATOR CHANGE;  Surgeon: Gardiner Rhyme, MD;  Location: [facility] CATH LAB;  Service: Cardiovascular;  Laterality: N/A;  ? PACEMAKER INSERTION  1998/2005  ? TRANSVENOUS PACEMAKER  ? TOTAL HIP ARTHROPLASTY    ? right  ? US ECHOCARDIOGRAPHY  11/25/2008  ? EF 55-60%; mild to moderate AI   ? ? ?ROS- all systems are reviewed and negative except as per HPI above ? ?Current Outpatient Medications  ?Medication Sig Dispense Refill  ? amoxicillin (AMOXIL) 875 MG tablet SMARTSIG:1 Tablet(s) By Mouth Every 12 Hours    ? dexamethasone (DECADRON) 4 MG tablet Take 4 mg by mouth once.    ? levothyroxine (SYNTHROID) 75 MCG tablet Take 75 mcg by mouth every morning. Takes 6 days per week. On the 7th day does not take anything.    ? mirtazapine (REMERON) 15 MG tablet 1 tablet at bedtime.    ? Multiple Vitamins-Minerals (MULTIVITAMIN ADULT PO) Take 1 tablet by mouth daily.    ? mupirocin ointment (BACTROBAN) 2 % APPLY TO AFFECTED AREA 3 TIMES A DAY    ? pravastatin (PRAVACHOL) 20 MG tablet Take 20 mg by mouth at bedtime.     ? UNABLE TO FIND OUTPATIENT PHYSICAL THERAPY ? ? ?Diagnosis: Left shoulder glenohumeral osteoarthritis 1 Mutually Defined 0  ? vitamin B-12 (CYANOCOBALAMIN) 1000 MCG tablet Take 1,000 mcg by mouth daily.    ? ?No current facility-administered medications for this visit.  ? ? ?Physical Exam: ?Vitals:  ? 04/28/21 1046  ?BP: 130/82  ?Pulse: 72  ?SpO2: 98%  ?Weight: 118 lb (53.5 kg)  ?Height: 5\' 4"  (1.626 m)  ?  ? ?  GEN- The patient is well appearing, alert and oriented x 3 today.   ?Head- normocephalic, atraumatic ?Eyes-  Sclera clear, conjunctiva pink ?Ears- hearing intact ?Oropharynx- clear ?Lungs- Clear to ausculation bilaterally, normal work of breathing ?Chest- pacemaker pocket is well healed ?Heart- Regular rate and rhythm, no murmurs, rubs or gallops, PMI not laterally displaced ?GI- soft, NT, ND, + BS ?Extremities- no clubbing, cyanosis, or edema ? ?Pacemaker interrogation- reviewed in detail today,  See PACEART report ? ?ekg tracing ordered today is personally reviewed and shows atrial paced rhythm ? ?Assessment and Plan: ? ?1. Symptomatic sinus bradycardia  ?Normal pacemaker function ?See Arita Miss Art report ?No changes today ?she is not device dependant today ? ?2. HTN ?Stable ?No  change required today ? ?Return in a year ? ?Hillis Range MD, FACC ?04/28/2021 ?10:55 AM ? ? ?

## 2021-05-01 ENCOUNTER — Encounter: Payer: Medicare Other | Admitting: Internal Medicine

## 2021-05-10 DIAGNOSIS — E039 Hypothyroidism, unspecified: Secondary | ICD-10-CM | POA: Diagnosis not present

## 2021-05-10 DIAGNOSIS — I700 Atherosclerosis of aorta: Secondary | ICD-10-CM | POA: Diagnosis not present

## 2021-05-10 DIAGNOSIS — E785 Hyperlipidemia, unspecified: Secondary | ICD-10-CM | POA: Diagnosis not present

## 2021-05-17 DIAGNOSIS — Z1331 Encounter for screening for depression: Secondary | ICD-10-CM | POA: Diagnosis not present

## 2021-05-17 DIAGNOSIS — M810 Age-related osteoporosis without current pathological fracture: Secondary | ICD-10-CM | POA: Diagnosis not present

## 2021-05-17 DIAGNOSIS — Z1339 Encounter for screening examination for other mental health and behavioral disorders: Secondary | ICD-10-CM | POA: Diagnosis not present

## 2021-05-17 DIAGNOSIS — Z0000 Encounter for general adult medical examination without abnormal findings: Secondary | ICD-10-CM | POA: Diagnosis not present

## 2021-05-17 DIAGNOSIS — E039 Hypothyroidism, unspecified: Secondary | ICD-10-CM | POA: Diagnosis not present

## 2021-05-17 DIAGNOSIS — I700 Atherosclerosis of aorta: Secondary | ICD-10-CM | POA: Diagnosis not present

## 2021-05-17 DIAGNOSIS — K5790 Diverticulosis of intestine, part unspecified, without perforation or abscess without bleeding: Secondary | ICD-10-CM | POA: Diagnosis not present

## 2021-05-23 DIAGNOSIS — H43813 Vitreous degeneration, bilateral: Secondary | ICD-10-CM | POA: Diagnosis not present

## 2021-05-23 DIAGNOSIS — H04123 Dry eye syndrome of bilateral lacrimal glands: Secondary | ICD-10-CM | POA: Diagnosis not present

## 2021-06-01 ENCOUNTER — Ambulatory Visit: Payer: Medicare Other | Admitting: Sports Medicine

## 2021-06-08 ENCOUNTER — Ambulatory Visit: Payer: Medicare Other | Admitting: Sports Medicine

## 2021-06-08 DIAGNOSIS — M79675 Pain in left toe(s): Secondary | ICD-10-CM

## 2021-06-08 DIAGNOSIS — B351 Tinea unguium: Secondary | ICD-10-CM | POA: Diagnosis not present

## 2021-06-08 DIAGNOSIS — M79674 Pain in right toe(s): Secondary | ICD-10-CM | POA: Diagnosis not present

## 2021-06-08 DIAGNOSIS — I739 Peripheral vascular disease, unspecified: Secondary | ICD-10-CM

## 2021-06-08 NOTE — Progress Notes (Signed)
Subjective: Robin Atkins is a 88 y.o. female patient seen today for follow-up evaluation for nail care and callus care.  Patient reports that she is doing good. No issues.   Patient Active Problem List   Diagnosis Date Noted   Fall 06/09/2019   Diverticula of intestine 06/05/2016   DDD (degenerative disc disease), cervical 11/24/2015   Unspecified inflammatory spondylopathy, cervical region (HCC) 11/24/2015   Headache 11/10/2015   Disequilibrium 11/07/2015   Anemia 11/15/2014   Encounter for general adult medical examination without abnormal findings 11/08/2014   Shoulder joint pain 04/23/2014   Chest pain 04/22/2014   Left upper arm pain 04/22/2014   Low back pain 12/14/2013   Hypercalcemia 09/28/2013   Abnormal gait 03/19/2013   Hyperlipidemia 02/05/2013   Fracture, femur, shaft (HCC) 04/29/2012   Hypothyroidism 04/29/2012   Other and unspecified hyperlipidemia 04/29/2012   Underimmunization status 10/04/2011   Cardiac pacemaker in situ 07/23/2011   SICK SINUS SYNDROME 02/24/2010   Hardening of the aorta (main artery of the heart) (HCC) 05/19/2009   Age-related osteoporosis without current pathological fracture 03/29/2009   Gastro-esophageal reflux disease without esophagitis 03/29/2009   Migraine 03/29/2009   Primary localized osteoarthritis of pelvic region and thigh 03/29/2009   Mitral valve disorder 03/29/2009    Current Outpatient Medications on File Prior to Visit  Medication Sig Dispense Refill   amoxicillin (AMOXIL) 875 MG tablet SMARTSIG:1 Tablet(s) By Mouth Every 12 Hours     dexamethasone (DECADRON) 4 MG tablet Take 4 mg by mouth once.     levothyroxine (SYNTHROID) 75 MCG tablet Take 75 mcg by mouth every morning. Takes 6 days per week. On the 7th day does not take anything.     mirtazapine (REMERON) 15 MG tablet 1 tablet at bedtime.     Multiple Vitamins-Minerals (MULTIVITAMIN ADULT PO) Take 1 tablet by mouth daily.     mupirocin ointment (BACTROBAN) 2  % APPLY TO AFFECTED AREA 3 TIMES A DAY     pravastatin (PRAVACHOL) 20 MG tablet Take 20 mg by mouth at bedtime.      traMADol (ULTRAM) 50 MG tablet Take 50 mg by mouth 2 (two) times daily as needed.     UNABLE TO FIND OUTPATIENT PHYSICAL THERAPY   Diagnosis: Left shoulder glenohumeral osteoarthritis 1 Mutually Defined 0   vitamin B-12 (CYANOCOBALAMIN) 1000 MCG tablet Take 1,000 mcg by mouth daily.     No current facility-administered medications on file prior to visit.    Allergies  Allergen Reactions   Alendronate Sodium Other (See Comments)   Atorvastatin Other (See Comments)   Ezetimibe Other (See Comments)   Glucosamine Other (See Comments)   Parathyroid Hormone (Recomb) Other (See Comments)   Povidone Iodine Other (See Comments)   Quinolones Other (See Comments)   Simvastatin Other (See Comments)   Prednisone Other (See Comments)    Pt was able to take this recently and did well on it Other reaction(s): felt drained.she really does feel she cannot tolerate prednisone.    Objective: Physical Exam  General: Well developed, nourished, no acute distress, awake, alert and oriented x 3  Vascular: Dorsalis pedis artery 1/4 bilateral, Posterior tibial artery 0/4 bilateral, skin temperature warm to warm proximal to distal bilateral lower extremities, + varicosities, scant pedal hair present bilateral.  Neurological: Gross sensation present via light touch bilateral.   Dermatological: Skin is warm, dry, and supple bilateral, Nails x 10 are mildly elongated and thick with improvement noted to right hallux nail with the  use of Vicks VapoRub, no webspace macerations present bilateral, no open lesions present bilateral, + minimal callus hyperkeratotic tissue present medial 1st toe on right>left and right plantar heel im improving. No signs of infection bilateral.  Musculoskeletal:  Asymptomatic limited range of motion bilateral hallux with hammertoe boney deformities noted bilateral.  Muscular strength within normal limits without pain on range of motion. No pain with calf compression bilateral.  Assessment and Plan:  Problem List Items Addressed This Visit   None Visit Diagnoses     Pain due to onychomycosis of toenails of both feet    -  Primary   PVD (peripheral vascular disease) (HCC)            -Examined patient.  -Re-Discussed treatment options for painful mycotic nail -Mechanically debrided all painful nails x 10 using a sterile nail nipper  -Return to office in 3 months for routine nail care or sooner if problems or issues arise.    Asencion Islam, DPM

## 2021-06-20 ENCOUNTER — Ambulatory Visit (INDEPENDENT_AMBULATORY_CARE_PROVIDER_SITE_OTHER): Payer: Medicare Other

## 2021-06-20 DIAGNOSIS — I495 Sick sinus syndrome: Secondary | ICD-10-CM | POA: Diagnosis not present

## 2021-06-21 LAB — CUP PACEART REMOTE DEVICE CHECK
Battery Impedance: 1123 Ohm
Battery Remaining Longevity: 58 mo
Battery Voltage: 2.77 V
Brady Statistic AP VP Percent: 0 %
Brady Statistic AP VS Percent: 100 %
Brady Statistic AS VP Percent: 0 %
Brady Statistic AS VS Percent: 0 %
Date Time Interrogation Session: 20230605141622
Implantable Lead Implant Date: 20050902
Implantable Lead Implant Date: 20060208
Implantable Lead Location: 753859
Implantable Lead Location: 753860
Implantable Lead Model: 4469
Implantable Lead Model: 4470
Implantable Lead Serial Number: 4470032319
Implantable Lead Serial Number: 449276
Implantable Pulse Generator Implant Date: 20150126
Lead Channel Impedance Value: 359 Ohm
Lead Channel Impedance Value: 607 Ohm
Lead Channel Pacing Threshold Amplitude: 0.75 V
Lead Channel Pacing Threshold Amplitude: 0.875 V
Lead Channel Pacing Threshold Pulse Width: 0.4 ms
Lead Channel Pacing Threshold Pulse Width: 0.4 ms
Lead Channel Setting Pacing Amplitude: 2 V
Lead Channel Setting Pacing Amplitude: 2.5 V
Lead Channel Setting Pacing Pulse Width: 0.4 ms
Lead Channel Setting Sensing Sensitivity: 2.8 mV

## 2021-07-04 NOTE — Progress Notes (Signed)
Remote pacemaker transmission.   

## 2021-08-04 ENCOUNTER — Other Ambulatory Visit (HOSPITAL_COMMUNITY): Payer: Self-pay

## 2021-08-07 ENCOUNTER — Ambulatory Visit (HOSPITAL_COMMUNITY)
Admission: RE | Admit: 2021-08-07 | Discharge: 2021-08-07 | Disposition: A | Discharge status: Home / Self Care | Payer: Medicare Other | Source: Ambulatory Visit | Attending: Internal Medicine | Admitting: Internal Medicine

## 2021-08-07 DIAGNOSIS — M810 Age-related osteoporosis without current pathological fracture: Secondary | ICD-10-CM | POA: Diagnosis not present

## 2021-08-07 MED ORDER — DENOSUMAB 60 MG/ML ~~LOC~~ SOSY: 60.0000 mg | PREFILLED_SYRINGE | Freq: Once | SUBCUTANEOUS | Status: AC

## 2021-08-07 MED ORDER — DENOSUMAB 60 MG/ML ~~LOC~~ SOSY
PREFILLED_SYRINGE | SUBCUTANEOUS | Status: AC
  Administered 2021-08-07: 60 mg via SUBCUTANEOUS
  Filled 2021-08-07: qty 1

## 2021-09-11 ENCOUNTER — Ambulatory Visit: Payer: Medicare Other | Admitting: Podiatry

## 2021-10-23 ENCOUNTER — Ambulatory Visit (INDEPENDENT_AMBULATORY_CARE_PROVIDER_SITE_OTHER): Payer: Medicare Other

## 2021-10-23 DIAGNOSIS — Z1231 Encounter for screening mammogram for malignant neoplasm of breast: Secondary | ICD-10-CM | POA: Diagnosis not present

## 2021-10-23 DIAGNOSIS — I495 Sick sinus syndrome: Secondary | ICD-10-CM | POA: Diagnosis not present

## 2021-10-24 LAB — CUP PACEART REMOTE DEVICE CHECK
Battery Impedance: 1229 Ohm
Battery Remaining Longevity: 55 mo
Battery Voltage: 2.77 V
Brady Statistic AP VP Percent: 0 %
Brady Statistic AP VS Percent: 100 %
Brady Statistic AS VP Percent: 0 %
Brady Statistic AS VS Percent: 0 %
Date Time Interrogation Session: 20231009115514
Implantable Lead Implant Date: 20050902
Implantable Lead Implant Date: 20060208
Implantable Lead Location: 753859
Implantable Lead Location: 753860
Implantable Lead Model: 4469
Implantable Lead Model: 4470
Implantable Lead Serial Number: 4470032319
Implantable Lead Serial Number: 449276
Implantable Pulse Generator Implant Date: 20150126
Lead Channel Impedance Value: 362 Ohm
Lead Channel Impedance Value: 617 Ohm
Lead Channel Pacing Threshold Amplitude: 0.75 V
Lead Channel Pacing Threshold Amplitude: 0.875 V
Lead Channel Pacing Threshold Pulse Width: 0.4 ms
Lead Channel Pacing Threshold Pulse Width: 0.4 ms
Lead Channel Setting Pacing Amplitude: 2 V
Lead Channel Setting Pacing Amplitude: 2.5 V
Lead Channel Setting Pacing Pulse Width: 0.4 ms
Lead Channel Setting Sensing Sensitivity: 2.8 mV

## 2021-10-30 DIAGNOSIS — I495 Sick sinus syndrome: Secondary | ICD-10-CM | POA: Diagnosis not present

## 2021-11-23 NOTE — Progress Notes (Signed)
Remote pacemaker transmission.   

## 2021-12-12 ENCOUNTER — Encounter: Payer: Self-pay | Admitting: Podiatry

## 2021-12-12 ENCOUNTER — Ambulatory Visit: Payer: Medicare Other | Admitting: Podiatry

## 2021-12-12 DIAGNOSIS — M79674 Pain in right toe(s): Secondary | ICD-10-CM | POA: Diagnosis not present

## 2021-12-12 DIAGNOSIS — B351 Tinea unguium: Secondary | ICD-10-CM

## 2021-12-12 DIAGNOSIS — I739 Peripheral vascular disease, unspecified: Secondary | ICD-10-CM | POA: Diagnosis not present

## 2021-12-12 DIAGNOSIS — M79675 Pain in left toe(s): Secondary | ICD-10-CM

## 2021-12-12 NOTE — Progress Notes (Signed)
  Subjective:  Patient ID: Robin Atkins, female    DOB: 01-08-1925,  MRN: [id number]  Chief Complaint  Patient presents with   Nail Problem    Routine foot care   QUANESHA KLIMASZEWSKI presents to clinic today for at risk foot care. Patient has h/o PAD and painful elongated mycotic toenails 1-5 bilaterally which are tender when wearing enclosed shoe gear. Pain is relieved with periodic professional debridement.   She is a former patient of Dr. Wynema Birch. Patient's husband is present during today's visit.  New problem(s): None.   PCP is Rodrigo Ran, MD.  Allergies  Allergen Reactions   Alendronate Sodium Other (See Comments)   Atorvastatin Other (See Comments)   Ezetimibe Other (See Comments)   Glucosamine Other (See Comments)   Parathyroid Hormone (Recomb) Other (See Comments)   Povidone Iodine Other (See Comments)   Quinolones Other (See Comments)   Simvastatin Other (See Comments)   Prednisone Other (See Comments)    Pt was able to take this recently and did well on it Other reaction(s): felt drained.she really does feel she cannot tolerate prednisone.    Review of Systems: Negative except as noted in the HPI.  Objective: No changes noted in today's physical examination.  Robin Atkins is a pleasant 88 y.o. female WD, WN in NAD. AAO x 3.  Vascular Examination: CFT <3 seconds b/l. DP pulses faintly palpable b/l. PT pulses nonpalpable b/l. Digital hair absent. Skin temperature gradient warm to warm b/l. No pain with calf compression. No ischemia or gangrene. No cyanosis or clubbing noted b/l. Mild varicosities b/l LE.   Neurological Examination: Sensation grossly intact b/l with 10 gram monofilament. Vibratory sensation intact b/l.   Dermatological Examination: Pedal skin thin, shiny and atrophic b/l LE. No open wounds b/l LE. No interdigital macerations noted b/l LE. Toenails 1-5 bilaterally elongated, discolored, dystrophic, thickened, and crumbly with subungual debris  and tenderness to dorsal palpation. Minimal hyperkeratosis noted medial IPJ of b/l great toes R>L.  Musculoskeletal Examination: Muscle strength 5/5 to b/l LE. Limited joint ROM to the bilateral great toes. Hammertoe deformity noted 2-5 b/l. Utilizes cane for ambulation assistance.  Radiographs: None  Assessment/Plan: 1. Pain due to onychomycosis of toenails of both feet   2. PVD (peripheral vascular disease) (HCC)     No orders of the defined types were placed in this encounter.   -Patient's family member present. All questions/concerns addressed on today's visit. -No new findings. No new orders. -Patient to continue soft, supportive shoe gear daily. -Mycotic toenails 1-5 bilaterally were debrided in length and girth with sterile nail nippers and dremel without incident. -Patient/POA to call should there be question/concern in the interim.   Return in about 3 months (around 03/14/2022).  Robin Atkins, DPM

## 2021-12-14 DIAGNOSIS — D649 Anemia, unspecified: Secondary | ICD-10-CM | POA: Diagnosis not present

## 2021-12-14 DIAGNOSIS — M25511 Pain in right shoulder: Secondary | ICD-10-CM | POA: Diagnosis not present

## 2021-12-14 DIAGNOSIS — I700 Atherosclerosis of aorta: Secondary | ICD-10-CM | POA: Diagnosis not present

## 2021-12-14 DIAGNOSIS — E039 Hypothyroidism, unspecified: Secondary | ICD-10-CM | POA: Diagnosis not present

## 2022-01-18 DIAGNOSIS — E785 Hyperlipidemia, unspecified: Secondary | ICD-10-CM | POA: Diagnosis not present

## 2022-02-23 ENCOUNTER — Other Ambulatory Visit (HOSPITAL_COMMUNITY): Payer: Self-pay | Admitting: *Deleted

## 2022-02-26 ENCOUNTER — Ambulatory Visit (HOSPITAL_COMMUNITY)
Admission: RE | Admit: 2022-02-26 | Discharge: 2022-02-26 | Disposition: A | Discharge status: Home / Self Care | Payer: Medicare Other | Source: Ambulatory Visit | Attending: Internal Medicine | Admitting: Internal Medicine

## 2022-02-26 DIAGNOSIS — M810 Age-related osteoporosis without current pathological fracture: Secondary | ICD-10-CM | POA: Diagnosis not present

## 2022-02-26 MED ORDER — DENOSUMAB 60 MG/ML ~~LOC~~ SOSY
PREFILLED_SYRINGE | SUBCUTANEOUS | Status: AC
  Filled 2022-02-26: qty 1

## 2022-02-26 MED ORDER — DENOSUMAB 60 MG/ML ~~LOC~~ SOSY
60.0000 mg | PREFILLED_SYRINGE | Freq: Once | SUBCUTANEOUS | Status: AC
  Administered 2022-02-26: 60 mg via SUBCUTANEOUS

## 2022-03-08 ENCOUNTER — Ambulatory Visit (INDEPENDENT_AMBULATORY_CARE_PROVIDER_SITE_OTHER): Payer: Medicare Other

## 2022-03-08 DIAGNOSIS — I495 Sick sinus syndrome: Secondary | ICD-10-CM | POA: Diagnosis not present

## 2022-03-08 LAB — CUP PACEART REMOTE DEVICE CHECK
Battery Impedance: 1310 Ohm
Battery Remaining Longevity: 53 mo
Battery Voltage: 2.77 V
Brady Statistic AP VP Percent: 0 %
Brady Statistic AP VS Percent: 100 %
Brady Statistic AS VP Percent: 0 %
Brady Statistic AS VS Percent: 0 %
Date Time Interrogation Session: 20240222142049
Implantable Lead Connection Status: 753985
Implantable Lead Connection Status: 753985
Implantable Lead Implant Date: 20050902
Implantable Lead Implant Date: 20060208
Implantable Lead Location: 753859
Implantable Lead Location: 753860
Implantable Lead Model: 4469
Implantable Lead Model: 4470
Implantable Lead Serial Number: 4470032319
Implantable Lead Serial Number: 449276
Implantable Pulse Generator Implant Date: 20150126
Lead Channel Impedance Value: 367 Ohm
Lead Channel Impedance Value: 661 Ohm
Lead Channel Pacing Threshold Amplitude: 0.625 V
Lead Channel Pacing Threshold Amplitude: 0.75 V
Lead Channel Pacing Threshold Pulse Width: 0.4 ms
Lead Channel Pacing Threshold Pulse Width: 0.4 ms
Lead Channel Setting Pacing Amplitude: 2 V
Lead Channel Setting Pacing Amplitude: 2.5 V
Lead Channel Setting Pacing Pulse Width: 0.4 ms
Lead Channel Setting Sensing Sensitivity: 2.8 mV
Zone Setting Status: 755011
Zone Setting Status: 755011

## 2022-03-26 ENCOUNTER — Ambulatory Visit (INDEPENDENT_AMBULATORY_CARE_PROVIDER_SITE_OTHER): Payer: Medicare Other | Admitting: Podiatry

## 2022-03-26 ENCOUNTER — Encounter: Payer: Self-pay | Admitting: Podiatry

## 2022-03-26 VITALS — BP 149/69

## 2022-03-26 DIAGNOSIS — L84 Corns and callosities: Secondary | ICD-10-CM

## 2022-03-26 DIAGNOSIS — Q828 Other specified congenital malformations of skin: Secondary | ICD-10-CM

## 2022-03-26 DIAGNOSIS — B351 Tinea unguium: Secondary | ICD-10-CM

## 2022-03-26 DIAGNOSIS — I739 Peripheral vascular disease, unspecified: Secondary | ICD-10-CM

## 2022-03-26 DIAGNOSIS — M79674 Pain in right toe(s): Secondary | ICD-10-CM | POA: Diagnosis not present

## 2022-03-26 DIAGNOSIS — M79675 Pain in left toe(s): Secondary | ICD-10-CM

## 2022-03-26 NOTE — Progress Notes (Signed)
  Subjective:  Patient ID: Robin Atkins, female    DOB: 1924/12/07,  MRN: [id number]  Robin Atkins presents to clinic today for at risk foot care. Patient has h/o PAD. Her husband is present during today's visit. Chief Complaint  Patient presents with   Nail Problem    [facility] PCP-[facility] PCP VST-11/2021   New problem(s): None.   PCP is Robin Infante, MD.  Allergies  Allergen Reactions   Alendronate Sodium Other (See Comments)   Atorvastatin Other (See Comments)   Ezetimibe Other (See Comments)   Glucosamine Other (See Comments)   Parathyroid Hormone (Recomb) Other (See Comments)   Povidone Iodine Other (See Comments)   Quinolones Other (See Comments)   Simvastatin Other (See Comments)   Prednisone Other (See Comments)    Pt was able to take this recently and did well on it Other reaction(s): felt drained.she really does feel she cannot tolerate prednisone.    Review of Systems: Negative except as noted in the HPI.  Objective:  Vitals:   03/26/22 0829  BP: (!) 149/69   Robin Atkins is a pleasant 89 y.o. female thin build in NAD. AAO x 3.  Vascular Examination: CFT <3 seconds b/l. DP pulses faintly palpable b/l. PT pulses nonpalpable b/l. Digital hair absent. Skin temperature gradient warm to warm b/l. No pain with calf compression. No ischemia or gangrene. No cyanosis or clubbing noted b/l. Mild varicosities b/l LE.   Neurological Examination: Sensation grossly intact b/l with 10 gram monofilament. Vibratory sensation intact b/l.   Dermatological Examination: Pedal skin thin, shiny and atrophic b/l LE. No open wounds b/l LE. No interdigital macerations noted b/l LE. Toenails 1-5 bilaterally elongated, discolored, dystrophic, thickened, and crumbly with subungual debris and tenderness to dorsal palpation.   Hyyperkeratosis noted medial IPJ of b/l great toes R>L. Porokeratosis plantar left heel.  Musculoskeletal Examination: Muscle strength 5/5 to b/l LE. Limited  joint ROM to the bilateral great toes. Hammertoe deformity noted 2-5 b/l. Utilizes cane for ambulation assistance.  Radiographs: None  Assessment/Plan: 1. Pain due to onychomycosis of toenails of both feet   2. Callus   3. Porokeratosis   4. PVD (peripheral vascular disease) ([place])   -Consent given for treatment as described below: -Examined patient. -Patient to continue soft, supportive shoe gear daily. -Toenails 1-5 b/l were debrided in length and girth with sterile nail nippers and dremel without iatrogenic bleeding.  -Callus(es) bilateral great toes pared utilizing sharp debridement with sterile blade without complication or incident. Total number debrided =2. -Porokeratotic lesion(s) left heel pared and enucleated with sterile currette without incident. Total number of lesions debrided=1. -Patient/POA to call should there be question/concern in the interim.   Return in about 3 months (around 06/26/2022).  Robin Atkins, DPM

## 2022-04-02 NOTE — Progress Notes (Signed)
Remote pacemaker transmission.   

## 2022-05-01 ENCOUNTER — Ambulatory Visit: Payer: Medicare Other | Attending: Cardiovascular Disease | Admitting: Cardiovascular Disease

## 2022-05-01 ENCOUNTER — Encounter: Payer: Self-pay | Admitting: Cardiovascular Disease

## 2022-05-01 VITALS — BP 142/72 | HR 68 | Ht 64.0 in | Wt 119.8 lb

## 2022-05-01 DIAGNOSIS — Z950 Presence of cardiac pacemaker: Secondary | ICD-10-CM | POA: Diagnosis not present

## 2022-05-01 NOTE — Patient Instructions (Signed)
Medication Instructions:  Your physician recommends that you continue on your current medications as directed. Please refer to the Current Medication list given to you today. *If you need a refill on your cardiac medications before your next appointment, please call your pharmacy*   Follow-Up: At [facility], you and your health needs are our priority.  As part of our continuing mission to provide you with exceptional heart care, we have created designated Provider Care Teams.  These Care Teams include your primary Cardiologist (physician) and Advanced Practice Providers (APPs -  Physician Assistants and Nurse Practitioners) who all work together to provide you with the care you need, when you need it.  We recommend signing up for the patient portal called "MyChart".  Sign up information is provided on this After Visit Summary.  MyChart is used to connect with patients for Virtual Visits (Telemedicine).  Patients are able to view lab/test results, encounter notes, upcoming appointments, etc.  Non-urgent messages can be sent to your provider as well.   To learn more about what you can do with MyChart, go to [email or web address].    Your next appointment:   1 year(s)  Provider:   Augustus Mealor, MD  

## 2022-05-01 NOTE — Progress Notes (Signed)
PCP: Rodrigo Ran, MD   Primary EP:  Dr Ennis Forts Robin Atkins is a 89 y.o. female who presents today for routine electrophysiology followup.  Since last being seen in our clinic, the patient reports doing very well.  Today, she denies symptoms of palpitations, chest pain, shortness of breath,  lower extremity edema, dizziness, presyncope, or syncope.  The patient is otherwise without complaint today.   she has no device related complaints -- no new tenderness, drainage, redness.   Past Medical History:  Diagnosis Date   Benign recurrent vertigo    Broken foot    Broken right foot and broken left knee in 2011   Cataracts, bilateral    DDD (degenerative disc disease), lumbar    Diverticulosis    GERD (gastroesophageal reflux disease)    Hemorrhoids    Hx of migraine headaches    Hyperlipidemia    Hypothyroidism    Knee cap dislocation 2011   left   Lumbar radiculopathy    MVP (mitral valve prolapse)    OP (osteoporosis)    S/P cardiac pacemaker procedure    Original implant 1998 with generator change in 2005. She has had prior lead revision.    Shingles    SSS (sick sinus syndrome)    s/p PTVP   Wrist fracture    lt   Past Surgical History:  Procedure Laterality Date   CARDIOVASCULAR STRESS TEST  07/10/2005   EF 78%, NO ISCHEMIA   HEMORROIDECTOMY     ORIF FEMUR FRACTURE Right 04/29/2012   Procedure: OPEN REDUCTION INTERNAL FIXATION (ORIF) DISTAL FEMUR FRACTURE;  Surgeon: Eulas Post, MD;  Location: [facility] OR;  Service: Orthopedics;  Laterality: Right;  biomet long condylar plate, flat jackson, cerclage wires, big c arm   PACEMAKER GENERATOR CHANGE  02/09/13   MDT Adapta L gen change by Dr [name]   PACEMAKER GENERATOR CHANGE N/A 02/09/2013   Procedure: PACEMAKER GENERATOR CHANGE;  Surgeon: Gardiner Rhyme, MD;  Location: [facility] CATH LAB;  Service: Cardiovascular;  Laterality: N/A;   PACEMAKER INSERTION  1998/2005   TRANSVENOUS PACEMAKER   TOTAL HIP ARTHROPLASTY      right   US ECHOCARDIOGRAPHY  11/25/2008   EF 55-60%; mild to moderate AI    ROS- all systems are reviewed and negative except as per HPI above  Current Outpatient Medications  Medication Sig Dispense Refill   levothyroxine (SYNTHROID) 75 MCG tablet Take 75 mcg by mouth every morning. Takes 6 days per week. On the 7th day does not take anything.     Multiple Vitamins-Minerals (MULTIVITAMIN ADULT PO) Take 1 tablet by mouth daily.     mupirocin ointment (BACTROBAN) 2 % APPLY TO AFFECTED AREA 3 TIMES A DAY     pravastatin (PRAVACHOL) 20 MG tablet Take 20 mg by mouth at bedtime.      traMADol (ULTRAM) 50 MG tablet Take 50 mg by mouth 2 (two) times daily as needed.     UNABLE TO FIND OUTPATIENT PHYSICAL THERAPY   Diagnosis: Left shoulder glenohumeral osteoarthritis 1 Mutually Defined 0   vitamin B-12 (CYANOCOBALAMIN) 1000 MCG tablet Take 1,000 mcg by mouth daily.     amoxicillin (AMOXIL) 875 MG tablet SMARTSIG:1 Tablet(s) By Mouth Every 12 Hours (Patient not taking: Reported on 05/01/2022)     dexamethasone (DECADRON) 4 MG tablet Take 4 mg by mouth once. (Patient not taking: Reported on 05/01/2022)     mirtazapine (REMERON) 15 MG tablet 1 tablet at bedtime. (Patient  not taking: Reported on 05/01/2022)     No current facility-administered medications for this visit.    Physical Exam: Vitals:   05/01/22 1205  BP: (!) 142/72  Pulse: 68  SpO2: 97%  Weight: 119 lb 12.8 oz (54.3 kg)  Height:  (1.626 m)     Gen: Appears comfortable, well-nourished CV: RRR, no dependent edema The device site is normal -- no tenderness, edema, drainage, redness, threatened erosion. Pulm: breathing easily   Pacemaker interrogation- reviewed in detail today,  See PACEART report  ekg tracing ordered today is personally reviewed and shows atrial paced rhythm  Assessment and Plan:  1. Symptomatic sinus bradycardia  Normal pacemaker function See Pace Art report No changes today she is not  device dependant today  2. HTN Stable No change required today  Return in a year  Maurice Small, MD  05/01/2022 12:18 PM

## 2022-05-22 DIAGNOSIS — E785 Hyperlipidemia, unspecified: Secondary | ICD-10-CM | POA: Diagnosis not present

## 2022-05-22 DIAGNOSIS — D649 Anemia, unspecified: Secondary | ICD-10-CM | POA: Diagnosis not present

## 2022-05-22 DIAGNOSIS — K219 Gastro-esophageal reflux disease without esophagitis: Secondary | ICD-10-CM | POA: Diagnosis not present

## 2022-05-22 DIAGNOSIS — R7989 Other specified abnormal findings of blood chemistry: Secondary | ICD-10-CM | POA: Diagnosis not present

## 2022-05-22 DIAGNOSIS — E039 Hypothyroidism, unspecified: Secondary | ICD-10-CM | POA: Diagnosis not present

## 2022-05-28 DIAGNOSIS — D23122 Other benign neoplasm of skin of left lower eyelid, including canthus: Secondary | ICD-10-CM | POA: Diagnosis not present

## 2022-05-28 DIAGNOSIS — H52203 Unspecified astigmatism, bilateral: Secondary | ICD-10-CM | POA: Diagnosis not present

## 2022-05-28 DIAGNOSIS — H524 Presbyopia: Secondary | ICD-10-CM | POA: Diagnosis not present

## 2022-05-28 DIAGNOSIS — H278 Other specified disorders of lens: Secondary | ICD-10-CM | POA: Diagnosis not present

## 2022-05-29 DIAGNOSIS — M25511 Pain in right shoulder: Secondary | ICD-10-CM | POA: Diagnosis not present

## 2022-05-29 DIAGNOSIS — Z0000 Encounter for general adult medical examination without abnormal findings: Secondary | ICD-10-CM | POA: Diagnosis not present

## 2022-05-29 DIAGNOSIS — Z1331 Encounter for screening for depression: Secondary | ICD-10-CM | POA: Diagnosis not present

## 2022-05-29 DIAGNOSIS — I700 Atherosclerosis of aorta: Secondary | ICD-10-CM | POA: Diagnosis not present

## 2022-05-29 DIAGNOSIS — M810 Age-related osteoporosis without current pathological fracture: Secondary | ICD-10-CM | POA: Diagnosis not present

## 2022-05-29 DIAGNOSIS — M791 Myalgia, unspecified site: Secondary | ICD-10-CM | POA: Diagnosis not present

## 2022-05-29 DIAGNOSIS — Z1212 Encounter for screening for malignant neoplasm of rectum: Secondary | ICD-10-CM | POA: Diagnosis not present

## 2022-05-29 DIAGNOSIS — R82998 Other abnormal findings in urine: Secondary | ICD-10-CM | POA: Diagnosis not present

## 2022-05-29 DIAGNOSIS — Z1389 Encounter for screening for other disorder: Secondary | ICD-10-CM | POA: Diagnosis not present

## 2022-06-07 ENCOUNTER — Ambulatory Visit (INDEPENDENT_AMBULATORY_CARE_PROVIDER_SITE_OTHER): Payer: Medicare Other

## 2022-06-07 DIAGNOSIS — I495 Sick sinus syndrome: Secondary | ICD-10-CM

## 2022-06-12 LAB — CUP PACEART REMOTE DEVICE CHECK
Battery Impedance: 1450 Ohm
Battery Remaining Longevity: 49 mo
Battery Voltage: 2.76 V
Brady Statistic AP VP Percent: 0 %
Brady Statistic AP VS Percent: 99 %
Brady Statistic AS VP Percent: 0 %
Brady Statistic AS VS Percent: 1 %
Date Time Interrogation Session: 20240527141836
Implantable Lead Connection Status: 753985
Implantable Lead Connection Status: 753985
Implantable Lead Implant Date: 20050902
Implantable Lead Implant Date: 20060208
Implantable Lead Location: 753859
Implantable Lead Location: 753860
Implantable Lead Model: 4469
Implantable Lead Model: 4470
Implantable Lead Serial Number: 4470032319
Implantable Lead Serial Number: 449276
Implantable Pulse Generator Implant Date: 20150126
Lead Channel Impedance Value: 373 Ohm
Lead Channel Impedance Value: 628 Ohm
Lead Channel Pacing Threshold Amplitude: 0.75 V
Lead Channel Pacing Threshold Amplitude: 0.75 V
Lead Channel Pacing Threshold Pulse Width: 0.4 ms
Lead Channel Pacing Threshold Pulse Width: 0.4 ms
Lead Channel Setting Pacing Amplitude: 2 V
Lead Channel Setting Pacing Amplitude: 2.5 V
Lead Channel Setting Pacing Pulse Width: 0.4 ms
Lead Channel Setting Sensing Sensitivity: 2.8 mV
Zone Setting Status: 755011
Zone Setting Status: 755011

## 2022-06-27 NOTE — Progress Notes (Signed)
Remote pacemaker transmission.   

## 2022-07-24 ENCOUNTER — Encounter: Payer: Self-pay | Admitting: Podiatry

## 2022-07-24 ENCOUNTER — Ambulatory Visit (INDEPENDENT_AMBULATORY_CARE_PROVIDER_SITE_OTHER): Payer: Medicare Other | Admitting: Podiatry

## 2022-07-24 VITALS — BP 165/74

## 2022-07-24 DIAGNOSIS — I700 Atherosclerosis of aorta: Secondary | ICD-10-CM | POA: Diagnosis not present

## 2022-07-24 DIAGNOSIS — M79675 Pain in left toe(s): Secondary | ICD-10-CM

## 2022-07-24 DIAGNOSIS — B351 Tinea unguium: Secondary | ICD-10-CM | POA: Diagnosis not present

## 2022-07-24 DIAGNOSIS — M79674 Pain in right toe(s): Secondary | ICD-10-CM

## 2022-07-24 DIAGNOSIS — I739 Peripheral vascular disease, unspecified: Secondary | ICD-10-CM

## 2022-07-24 DIAGNOSIS — L84 Corns and callosities: Secondary | ICD-10-CM

## 2022-07-24 NOTE — Progress Notes (Signed)
  Subjective:  Patient ID: Robin Atkins, female    DOB: Sep 21, 1924,  MRN: [id number]  AIDEE LATIMORE presents to clinic today for at risk foot care. Patient has h/o PAD and callus(es) bilateral great toes and painful thick toenails that are difficult to trim. Painful toenails interfere with ambulation. Aggravating factors include wearing enclosed shoe gear. Pain is relieved with periodic professional debridement. Painful calluses are aggravated when weightbearing with and without shoegear. Pain is relieved with periodic professional debridement.  Chief Complaint  Patient presents with   Nail Problem    RFC   New problem(s): None.   PCP is Rodrigo Ran, MD.  Allergies  Allergen Reactions   Alendronate Sodium Other (See Comments)   Atorvastatin Other (See Comments)   Ezetimibe Other (See Comments)   Glucosamine Other (See Comments)   Parathyroid Hormone (Recomb) Other (See Comments)   Povidone Iodine Other (See Comments)   Quinolones Other (See Comments)   Simvastatin Other (See Comments)   Prednisone Other (See Comments)    Pt was able to take this recently and did well on it Other reaction(s): felt drained.she really does feel she cannot tolerate prednisone.    Review of Systems: Negative except as noted in the HPI.  Objective: No changes noted in today's physical examination. Vitals:   07/24/22 1000  BP: (!) 165/74   Robin Atkins is a pleasant 89 y.o. female WD, WN in NAD. AAO x 3.  Vascular Examination: CFT <3 seconds b/l. DP pulses faintly palpable b/l. PT pulses nonpalpable b/l. Digital hair absent. Skin temperature gradient warm to warm b/l. No pain with calf compression. No ischemia or gangrene. No cyanosis or clubbing noted b/l.    Neurological Examination: Sensation grossly intact b/l with 10 gram monofilament. Vibratory sensation intact b/l.   Dermatological Examination: Pedal skin warm and supple b/l. No open wounds b/l. No interdigital macerations.  Toenails 1-5 b/l thick, discolored, elongated with subungual debris and pain on dorsal palpation.  Hyperkeratotic lesion(s) bilateral great toes.  No erythema, no edema, no drainage, no fluctuance.  Musculoskeletal Examination: Muscle strength 5/5 to all lower extremity muscle groups bilaterally. Hammertoe deformity noted 2-5 b/l. Utilizes cane for ambulation assistance.  Radiographs: None  Assessment/Plan: 1. Pain due to onychomycosis of toenails of both feet   2. Callus   3. PVD (peripheral vascular disease) (HCC)     -Consent given for treatment as described below: -Examined patient. -Mycotic toenails 1-5 bilaterally were debrided in length and girth with sterile nail nippers and dremel without incident. -Callus(es) bilateral great toes pared utilizing sterile scalpel blade without complication or incident. Total number debrided =2. -Patient/POA to call should there be question/concern in the interim.   Return in about 3 months (around 10/24/2022).  Freddie Breech, DPM

## 2022-07-30 DIAGNOSIS — M25512 Pain in left shoulder: Secondary | ICD-10-CM | POA: Diagnosis not present

## 2022-07-30 DIAGNOSIS — M2559 Pain in other specified joint: Secondary | ICD-10-CM | POA: Diagnosis not present

## 2022-07-30 DIAGNOSIS — R768 Other specified abnormal immunological findings in serum: Secondary | ICD-10-CM | POA: Diagnosis not present

## 2022-07-30 DIAGNOSIS — M25511 Pain in right shoulder: Secondary | ICD-10-CM | POA: Diagnosis not present

## 2022-08-13 DIAGNOSIS — M542 Cervicalgia: Secondary | ICD-10-CM | POA: Diagnosis not present

## 2022-08-13 DIAGNOSIS — M19012 Primary osteoarthritis, left shoulder: Secondary | ICD-10-CM | POA: Diagnosis not present

## 2022-08-13 DIAGNOSIS — M19011 Primary osteoarthritis, right shoulder: Secondary | ICD-10-CM | POA: Diagnosis not present

## 2022-08-23 ENCOUNTER — Other Ambulatory Visit (HOSPITAL_COMMUNITY): Payer: Self-pay

## 2022-08-27 ENCOUNTER — Ambulatory Visit (HOSPITAL_COMMUNITY)
Admission: RE | Admit: 2022-08-27 | Discharge: 2022-08-27 | Disposition: A | Discharge status: Home / Self Care | Payer: Medicare Other | Source: Ambulatory Visit | Attending: Internal Medicine | Admitting: Internal Medicine

## 2022-08-27 DIAGNOSIS — Z79620 Long term (current) use of immunosuppressive biologic: Secondary | ICD-10-CM | POA: Diagnosis not present

## 2022-08-27 DIAGNOSIS — M810 Age-related osteoporosis without current pathological fracture: Secondary | ICD-10-CM | POA: Diagnosis not present

## 2022-08-27 MED ORDER — DENOSUMAB 60 MG/ML ~~LOC~~ SOSY
PREFILLED_SYRINGE | SUBCUTANEOUS | Status: AC
  Filled 2022-08-27: qty 1

## 2022-08-27 MED ORDER — DENOSUMAB 60 MG/ML ~~LOC~~ SOSY
60.0000 mg | PREFILLED_SYRINGE | Freq: Once | SUBCUTANEOUS | Status: AC
  Administered 2022-08-27: 60 mg via SUBCUTANEOUS

## 2022-09-06 ENCOUNTER — Ambulatory Visit (INDEPENDENT_AMBULATORY_CARE_PROVIDER_SITE_OTHER): Payer: Medicare Other

## 2022-09-06 DIAGNOSIS — I495 Sick sinus syndrome: Secondary | ICD-10-CM | POA: Diagnosis not present

## 2022-09-07 LAB — CUP PACEART REMOTE DEVICE CHECK
Battery Impedance: 1563 Ohm
Battery Remaining Longevity: 46 mo
Battery Voltage: 2.76 V
Brady Statistic AP VP Percent: 0 %
Brady Statistic AP VS Percent: 99 %
Brady Statistic AS VP Percent: 0 %
Brady Statistic AS VS Percent: 1 %
Date Time Interrogation Session: 20240822163355
Implantable Lead Connection Status: 753985
Implantable Lead Connection Status: 753985
Implantable Lead Implant Date: 20050902
Implantable Lead Implant Date: 20060208
Implantable Lead Location: 753859
Implantable Lead Location: 753860
Implantable Lead Model: 4469
Implantable Lead Model: 4470
Implantable Lead Serial Number: 4470032319
Implantable Lead Serial Number: 449276
Implantable Pulse Generator Implant Date: 20150126
Lead Channel Impedance Value: 371 Ohm
Lead Channel Impedance Value: 619 Ohm
Lead Channel Pacing Threshold Amplitude: 0.625 V
Lead Channel Pacing Threshold Amplitude: 0.75 V
Lead Channel Pacing Threshold Pulse Width: 0.4 ms
Lead Channel Pacing Threshold Pulse Width: 0.4 ms
Lead Channel Setting Pacing Amplitude: 2 V
Lead Channel Setting Pacing Amplitude: 2.5 V
Lead Channel Setting Pacing Pulse Width: 0.4 ms
Lead Channel Setting Sensing Sensitivity: 2.8 mV
Zone Setting Status: 755011
Zone Setting Status: 755011

## 2022-09-18 NOTE — Progress Notes (Signed)
Remote pacemaker transmission.   

## 2022-10-10 DIAGNOSIS — J029 Acute pharyngitis, unspecified: Secondary | ICD-10-CM | POA: Diagnosis not present

## 2022-10-30 ENCOUNTER — Ambulatory Visit: Payer: Medicare Other | Admitting: Podiatry

## 2022-11-12 DIAGNOSIS — M19012 Primary osteoarthritis, left shoulder: Secondary | ICD-10-CM | POA: Diagnosis not present

## 2022-11-12 DIAGNOSIS — M19011 Primary osteoarthritis, right shoulder: Secondary | ICD-10-CM | POA: Diagnosis not present

## 2022-11-27 DIAGNOSIS — I700 Atherosclerosis of aorta: Secondary | ICD-10-CM | POA: Diagnosis not present

## 2022-11-27 DIAGNOSIS — E039 Hypothyroidism, unspecified: Secondary | ICD-10-CM | POA: Diagnosis not present

## 2022-12-06 ENCOUNTER — Ambulatory Visit (INDEPENDENT_AMBULATORY_CARE_PROVIDER_SITE_OTHER): Payer: Medicare Other

## 2022-12-06 DIAGNOSIS — I495 Sick sinus syndrome: Secondary | ICD-10-CM | POA: Diagnosis not present

## 2022-12-11 DIAGNOSIS — E039 Hypothyroidism, unspecified: Secondary | ICD-10-CM | POA: Diagnosis not present

## 2022-12-11 LAB — CUP PACEART REMOTE DEVICE CHECK
Battery Impedance: 1619 Ohm
Battery Remaining Longevity: 44 mo
Battery Voltage: 2.76 V
Brady Statistic AP VP Percent: 0 %
Brady Statistic AP VS Percent: 99 %
Brady Statistic AS VP Percent: 0 %
Brady Statistic AS VS Percent: 1 %
Date Time Interrogation Session: 20241125155744
Implantable Lead Connection Status: 753985
Implantable Lead Connection Status: 753985
Implantable Lead Implant Date: 20050902
Implantable Lead Implant Date: 20060208
Implantable Lead Location: 753859
Implantable Lead Location: 753860
Implantable Lead Model: 4469
Implantable Lead Model: 4470
Implantable Lead Serial Number: 4470032319
Implantable Lead Serial Number: 449276
Implantable Pulse Generator Implant Date: 20150126
Lead Channel Impedance Value: 363 Ohm
Lead Channel Impedance Value: 597 Ohm
Lead Channel Pacing Threshold Amplitude: 0.625 V
Lead Channel Pacing Threshold Amplitude: 0.75 V
Lead Channel Pacing Threshold Pulse Width: 0.4 ms
Lead Channel Pacing Threshold Pulse Width: 0.4 ms
Lead Channel Setting Pacing Amplitude: 2 V
Lead Channel Setting Pacing Amplitude: 2.5 V
Lead Channel Setting Pacing Pulse Width: 0.4 ms
Lead Channel Setting Sensing Sensitivity: 2.8 mV
Zone Setting Status: 755011
Zone Setting Status: 755011

## 2023-01-01 NOTE — Progress Notes (Signed)
Remote pacemaker transmission.   

## 2023-01-01 NOTE — Addendum Note (Signed)
Addended by: Elease Etienne A on: 01/01/2023 08:32 AM   Modules accepted: Orders

## 2023-01-21 ENCOUNTER — Emergency Department (HOSPITAL_COMMUNITY): Payer: Medicare Other

## 2023-01-21 ENCOUNTER — Emergency Department (HOSPITAL_COMMUNITY)
Admission: EM | Admit: 2023-01-21 | Discharge: 2023-01-21 | Disposition: A | Discharge status: Home / Self Care | Payer: Medicare Other | Attending: Emergency Medicine | Admitting: Emergency Medicine

## 2023-01-21 DIAGNOSIS — S0990XA Unspecified injury of head, initial encounter: Secondary | ICD-10-CM | POA: Diagnosis not present

## 2023-01-21 DIAGNOSIS — K769 Liver disease, unspecified: Secondary | ICD-10-CM | POA: Insufficient documentation

## 2023-01-21 DIAGNOSIS — Z950 Presence of cardiac pacemaker: Secondary | ICD-10-CM | POA: Insufficient documentation

## 2023-01-21 DIAGNOSIS — I495 Sick sinus syndrome: Secondary | ICD-10-CM | POA: Insufficient documentation

## 2023-01-21 DIAGNOSIS — Z79899 Other long term (current) drug therapy: Secondary | ICD-10-CM | POA: Diagnosis not present

## 2023-01-21 DIAGNOSIS — E785 Hyperlipidemia, unspecified: Secondary | ICD-10-CM | POA: Diagnosis not present

## 2023-01-21 DIAGNOSIS — M79604 Pain in right leg: Secondary | ICD-10-CM | POA: Diagnosis not present

## 2023-01-21 DIAGNOSIS — W19XXXA Unspecified fall, initial encounter: Secondary | ICD-10-CM | POA: Diagnosis not present

## 2023-01-21 DIAGNOSIS — W108XXA Fall (on) (from) other stairs and steps, initial encounter: Secondary | ICD-10-CM | POA: Insufficient documentation

## 2023-01-21 DIAGNOSIS — S199XXA Unspecified injury of neck, initial encounter: Secondary | ICD-10-CM | POA: Diagnosis not present

## 2023-01-21 DIAGNOSIS — M1712 Unilateral primary osteoarthritis, left knee: Secondary | ICD-10-CM | POA: Diagnosis not present

## 2023-01-21 DIAGNOSIS — S299XXA Unspecified injury of thorax, initial encounter: Secondary | ICD-10-CM | POA: Diagnosis not present

## 2023-01-21 DIAGNOSIS — R457 State of emotional shock and stress, unspecified: Secondary | ICD-10-CM | POA: Diagnosis not present

## 2023-01-21 DIAGNOSIS — R29898 Other symptoms and signs involving the musculoskeletal system: Secondary | ICD-10-CM | POA: Diagnosis not present

## 2023-01-21 DIAGNOSIS — S22029A Unspecified fracture of second thoracic vertebra, initial encounter for closed fracture: Secondary | ICD-10-CM | POA: Diagnosis not present

## 2023-01-21 DIAGNOSIS — I700 Atherosclerosis of aorta: Secondary | ICD-10-CM | POA: Diagnosis not present

## 2023-01-21 DIAGNOSIS — S80812A Abrasion, left lower leg, initial encounter: Secondary | ICD-10-CM | POA: Diagnosis not present

## 2023-01-21 DIAGNOSIS — R9082 White matter disease, unspecified: Secondary | ICD-10-CM | POA: Diagnosis not present

## 2023-01-21 DIAGNOSIS — M79605 Pain in left leg: Secondary | ICD-10-CM | POA: Diagnosis not present

## 2023-01-21 DIAGNOSIS — I10 Essential (primary) hypertension: Secondary | ICD-10-CM | POA: Diagnosis not present

## 2023-01-21 DIAGNOSIS — S22009A Unspecified fracture of unspecified thoracic vertebra, initial encounter for closed fracture: Secondary | ICD-10-CM | POA: Diagnosis not present

## 2023-01-21 DIAGNOSIS — M25551 Pain in right hip: Secondary | ICD-10-CM | POA: Diagnosis not present

## 2023-01-21 DIAGNOSIS — S3991XA Unspecified injury of abdomen, initial encounter: Secondary | ICD-10-CM | POA: Diagnosis not present

## 2023-01-21 DIAGNOSIS — K838 Other specified diseases of biliary tract: Secondary | ICD-10-CM | POA: Insufficient documentation

## 2023-01-21 DIAGNOSIS — S3993XA Unspecified injury of pelvis, initial encounter: Secondary | ICD-10-CM | POA: Diagnosis not present

## 2023-01-21 LAB — COMPREHENSIVE METABOLIC PANEL
ALT: 30 U/L (ref 0–44)
AST: 33 U/L (ref 15–41)
Albumin: 3.2 g/dL — ABNORMAL LOW (ref 3.5–5.0)
Alkaline Phosphatase: 75 U/L (ref 38–126)
Anion gap: 8 (ref 5–15)
BUN: 17 mg/dL (ref 8–23)
CO2: 25 mmol/L (ref 22–32)
Calcium: 9.1 mg/dL (ref 8.9–10.3)
Chloride: 103 mmol/L (ref 98–111)
Creatinine, Ser: 0.79 mg/dL (ref 0.44–1.00)
GFR, Estimated: >60 mL/min (ref >60)
Glucose, Bld: 123 mg/dL — ABNORMAL HIGH (ref 70–99)
Potassium: 4 mmol/L (ref 3.5–5.1)
Sodium: 136 mmol/L (ref 135–145)
Total Bilirubin: 0.6 mg/dL (ref 0.0–1.2)
Total Protein: 5.9 g/dL — ABNORMAL LOW (ref 6.5–8.1)

## 2023-01-21 LAB — I-STAT CHEM 8, ED
BUN: 18 mg/dL (ref 8–23)
Calcium, Ion: 1.2 mmol/L (ref 1.15–1.40)
Chloride: 102 mmol/L (ref 98–111)
Creatinine, Ser: 0.7 mg/dL (ref 0.44–1.00)
Glucose, Bld: 114 mg/dL — ABNORMAL HIGH (ref 70–99)
HCT: 30 % — ABNORMAL LOW (ref 36.0–46.0)
Hemoglobin: 10.2 g/dL — ABNORMAL LOW (ref 12.0–15.0)
Potassium: 3.9 mmol/L (ref 3.5–5.1)
Sodium: 136 mmol/L (ref 135–145)
TCO2: 24 mmol/L (ref 22–32)

## 2023-01-21 LAB — CBC
HCT: 31 % — ABNORMAL LOW (ref 36.0–46.0)
Hemoglobin: 10 g/dL — ABNORMAL LOW (ref 12.0–15.0)
MCH: 31 pg (ref 26.0–34.0)
MCHC: 32.3 g/dL (ref 30.0–36.0)
MCV: 96 fL (ref 80.0–100.0)
Platelets: 141 10*3/uL — ABNORMAL LOW (ref 150–400)
RBC: 3.23 MIL/uL — ABNORMAL LOW (ref 3.87–5.11)
RDW: 14.2 % (ref 11.5–15.5)
WBC: 6.6 10*3/uL (ref 4.0–10.5)
nRBC: 0 % (ref 0.0–0.2)

## 2023-01-21 LAB — ETHANOL: Alcohol, Ethyl (B): <10 mg/dL (ref <10)

## 2023-01-21 MED ORDER — IOHEXOL 350 MG/ML SOLN
75.0000 mL | Freq: Once | INTRAVENOUS | Status: AC | PRN
  Administered 2023-01-21: 75 mL via INTRAVENOUS

## 2023-01-21 MED ORDER — TETANUS-DIPHTH-ACELL PERTUSSIS 5-2.5-18.5 LF-MCG/0.5 IM SUSY: 0.5000 mL | PREFILLED_SYRINGE | Freq: Once | INTRAMUSCULAR | Status: DC

## 2023-01-21 NOTE — Discharge Instructions (Signed)
 You were seen for your broken back (thoracic spine) in the emergency department.   At home, please take Tylenol  and use over-the-counter lidocaine  patches for your pain.    Check your MyChart online for the results of any tests that had not resulted by the time you left the emergency department.   Follow-up with your primary doctor in 2-3 days regarding your visit.  Follow-up with the spine surgeons about your broken back.  You will need also follow-up with the GI doctors and oncology about the liver duct and biliary duct dilation and possible lesion.  You may need an ERCP for this.  Return immediately to the emergency department if you experience any of the following: Severe pain, leg numbness or weakness, bowel or bladder incontinence, or any other concerning symptoms.    Thank you for visiting our Emergency Department. It was a pleasure taking care of you today.

## 2023-01-21 NOTE — ED Provider Notes (Signed)
 [place] [facility] AT [facility] Provider Note   CSN: [id number] Arrival date & time: 01/21/23  1510     History  Chief Complaint  Patient presents with   Robin Atkins is a 89 y.o. female.  89 year old female with a history of sick sinus syndrome status post pacemaker and hyperlipidemia who presents emergency department after a fall.  Patient says she was at the top of the stairs and thinks she missed stepped and fell down approximately 10 stairs.  Did hit her head.  Also hit her left leg and right hip.  Says she is having difficulty moving her right hip due to the pain.  No LOC.  No prolonged downtime.  Not on blood thinners.  Unsure last Tdap shot.       Home Medications Prior to Admission medications   Medication Sig Start Date End Date Taking? Authorizing Provider  acetaminophen  (TYLENOL ) 500 MG tablet Take 1,000 mg by mouth daily.   Yes [provider]  Cholecalciferol (VITAMIN D -3 PO) Take 1 tablet by mouth daily with lunch.   Yes [provider]  Cyanocobalamin (VITAMIN B-12 PO) Take 1 tablet by mouth daily with lunch.   Yes [provider]  levothyroxine  (SYNTHROID ) 75 MCG tablet Take 75 mcg by mouth daily before breakfast. Takes 75mcg 6 days per week. On the 7th day does not take anything. 12/15/18  Yes [provider]  Multiple Vitamins-Minerals (MULTIVITAMIN WOMEN 50+) TABS Take 1 tablet by mouth daily with lunch.   Yes [provider]  pravastatin  (PRAVACHOL ) 20 MG tablet Take 20 mg by mouth at bedtime.    Yes [provider]  traMADol  (ULTRAM ) 50 MG tablet Take 50 mg by mouth 2 (two) times daily as needed for severe pain (pain score 7-10). 05/17/21  Yes [provider]      Allergies    Betadine [povidone iodine], Fosamax [alendronate sodium], Glucosamine, Lipitor [atorvastatin], Parathyroid hormone (recomb), Quinolones, Zetia [ezetimibe], and Zocor  [simvastatin ]    Review  of Systems   Review of Systems  Physical Exam Updated Vital Signs BP (!) 181/65   Pulse 60   Temp 98.8 F (37.1 C) (Oral)   Resp 12   SpO2 97%  Physical Exam Constitutional:      General: She is not in acute distress.    Appearance: Normal appearance. She is not ill-appearing.  HENT:     Head: Normocephalic and atraumatic.     Right Ear: External ear normal.     Left Ear: External ear normal.     Mouth/Throat:     Mouth: Mucous membranes are moist.     Pharynx: Oropharynx is clear.  Eyes:     Extraocular Movements: Extraocular movements intact.     Conjunctiva/sclera: Conjunctivae normal.     Pupils: Pupils are equal, round, and reactive to light.  Neck:     Comments: C-collar in place Cardiovascular:     Rate and Rhythm: Normal rate and regular rhythm.     Pulses: Normal pulses.     Heart sounds: Normal heart sounds.  Pulmonary:     Effort: Pulmonary effort is normal. No respiratory distress.     Breath sounds: Normal breath sounds.  Abdominal:     General: Abdomen is flat. There is no distension.     Palpations: Abdomen is soft. There is no mass.     Tenderness: There is no abdominal tenderness. There is no guarding.  Musculoskeletal:  General: No deformity. Normal range of motion.     Comments: No tenderness to palpation of chest wall.  No bruising noted.  No tenderness to palpation of bilateral clavicles.  No tenderness to palpation, bruising, or deformities noted of bilateral shoulders, elbows, wrists, knees, or ankles.  Tenderness palpation of right hip.  Abrasion to left shin.  Neurological:     General: No focal deficit present.     Mental Status: She is alert and oriented to person, place, and time. Mental status is at baseline.     Cranial Nerves: No cranial nerve deficit.     Sensory: No sensory deficit.     Motor: No weakness.     ED Results / Procedures / Treatments   Labs (all labs ordered are listed, but only abnormal results are  displayed) Labs Reviewed  COMPREHENSIVE METABOLIC PANEL - Abnormal; Notable for the following components:      Result Value   Glucose, Bld 123 (*)    Total Protein 5.9 (*)    Albumin  3.2 (*)    All other components within normal limits  CBC - Abnormal; Notable for the following components:   RBC 3.23 (*)    Hemoglobin 10.0 (*)    HCT 31.0 (*)    Platelets 141 (*)    All other components within normal limits  I-STAT CHEM 8, ED - Abnormal; Notable for the following components:   Glucose, Bld 114 (*)    Hemoglobin 10.2 (*)    HCT 30.0 (*)    All other components within normal limits  ETHANOL  URINALYSIS, ROUTINE W REFLEX MICROSCOPIC  RAPID URINE DRUG SCREEN, HOSP PERFORMED    EKG None  Radiology CT CHEST ABDOMEN PELVIS W CONTRAST Result Date: 01/21/2023 CLINICAL DATA:  Blunt trauma * Tracking Code: BO * EXAM: CT CHEST, ABDOMEN, AND PELVIS WITH CONTRAST TECHNIQUE: Multidetector CT imaging of the chest, abdomen and pelvis was performed following the standard protocol during bolus administration of intravenous contrast. RADIATION DOSE REDUCTION: This exam was performed according to the departmental dose-optimization program which includes automated exposure control, adjustment of the mA and/or kV according to patient size and/or use of iterative reconstruction technique. CONTRAST:  75mL OMNIPAQUE  IOHEXOL  350 MG/ML SOLN COMPARISON:  06/06/2016 FINDINGS: CT CHEST FINDINGS Cardiovascular: Aortic atherosclerosis. Right chest multi lead pacer. Normal heart size. No pericardial effusion. Mediastinum/Nodes: No enlarged mediastinal, hilar, or axillary lymph nodes. Thyroid  gland, trachea, and esophagus demonstrate no significant findings. Lungs/Pleura: Bandlike scarring of the left lung base. No pleural effusion or pneumothorax. Musculoskeletal: No chest wall abnormality. Mildly displaced spinous process fracture of T2 (series 7, image 63). Mild, acute appearing superior endplate wedge deformity of T3,  approximately 25% anterior height loss. Superior endplate deformity of T10, uncertain acuity. Nonacute superior endplate deformity of L3, as seen by prior examination dated 06/06/2016. CT ABDOMEN PELVIS FINDINGS Hepatobiliary: No solid liver abnormality is seen. Dilatation of the common bile duct within the pancreatic head measuring up to 1.3 cm in caliber (series 6, image 58). Soft tissue thickening about the common hepatic duct and intrahepatic biliary ducts (series 6, image 59). Pancreas: Unremarkable. No pancreatic ductal dilatation or surrounding inflammatory changes. Spleen: Normal in size. Small, chronic subcapsular fluid collection about the spleen, unchanged compared to prior examinatin dated 06/06/2016. Adrenals/Urinary Tract: Adrenal glands are unremarkable. Kidneys are normal, without renal calculi, solid lesion, or hydronephrosis. Bladder is unremarkable. Stomach/Bowel: Stomach is within normal limits. Appendix appears normal. No evidence of bowel wall thickening, distention, or inflammatory changes.  Vascular/Lymphatic: Severe aortic atherosclerosis. No enlarged abdominal or pelvic lymph nodes. Reproductive: No mass or other abnormality. Other: No abdominal wall hernia or abnormality. No ascites. Musculoskeletal: No acute osseous findings. Right hip arthroplasty. Left hip intramedullary nail fixation. IMPRESSION: 1. Mildly displaced spinous process fracture of T2. 2. Mild, acute appearing superior endplate wedge deformity of T3, approximately 25% anterior height loss. Correlate for acute pain and point tenderness. 3. Superior endplate deformity of T10, uncertain acuity. Correlate for acute pain and point tenderness. MRI may be used to assess for marrow edema and fracture acuity if desired. 4. No other CT evidence of acute traumatic injury to the chest, abdomen, or pelvis. Nonacute subcapsular fluid collection about the spleen and superior endplate deformity of L3 seen on prior examination dated 2018. 5.  Dilatation of the common bile duct within the pancreatic head measuring up to 1.3 cm in caliber. Soft tissue thickening about the common hepatic duct and intrahepatic biliary ducts. This appearance is new when compared to examination dated 2018 and of uncertain significance although concerning for infiltrative biliary malignancy. Consider ERCP to further evaluate if clinically appropriate given very advanced patient age. Aortic Atherosclerosis (ICD10-I70.0). Electronically Signed   By: Marolyn JONETTA Jaksch M.D.   On: 01/21/2023 18:54   CT HEAD WO CONTRAST Result Date: 01/21/2023 CLINICAL DATA:  Head and neck trauma EXAM: CT HEAD WITHOUT CONTRAST CT CERVICAL SPINE WITHOUT CONTRAST TECHNIQUE: Multidetector CT imaging of the head and cervical spine was performed following the standard protocol without intravenous contrast. Multiplanar CT image reconstructions of the cervical spine were also generated. RADIATION DOSE REDUCTION: This exam was performed according to the departmental dose-optimization program which includes automated exposure control, adjustment of the mA and/or kV according to patient size and/or use of iterative reconstruction technique. COMPARISON:  11/12/2019 FINDINGS: CT HEAD FINDINGS Brain: No evidence of acute infarction, hemorrhage, hydrocephalus, extra-axial collection or mass lesion/mass effect. Mild periventricular white matter hypodensity. Vascular: No hyperdense vessel or unexpected calcification. Skull: Normal. Negative for fracture or focal lesion. Sinuses/Orbits: No acute finding. Other: None. CT CERVICAL SPINE FINDINGS Alignment: Normal. Skull base and vertebrae: No acute fracture of the cervical spine. Mildly displaced spinous process fracture of T2 (series 10, image 42) and wedge deformity of T3, described on separately reported examination of the chest, abdomen, and pelvis. No primary bone lesion or focal pathologic process. Soft tissues and spinal canal: No prevertebral fluid or swelling.  No visible canal hematoma. Disc levels: Focally mild disc space height loss and osteophytosis of C5-C6 with otherwise intact cervical disc spaces. Upper chest: Negative. Other: None. IMPRESSION: 1. No acute intracranial pathology. Minimal small-vessel white matter disease for patient age. 2. No fracture or static subluxation of the cervical spine proper. 3. Mildly displaced spinous process fracture of T2 and wedge deformity of T3, described on separately reported examination of the chest, abdomen, and pelvis. Electronically Signed   By: Marolyn JONETTA Jaksch M.D.   On: 01/21/2023 18:36   CT CERVICAL SPINE WO CONTRAST Result Date: 01/21/2023 CLINICAL DATA:  Head and neck trauma EXAM: CT HEAD WITHOUT CONTRAST CT CERVICAL SPINE WITHOUT CONTRAST TECHNIQUE: Multidetector CT imaging of the head and cervical spine was performed following the standard protocol without intravenous contrast. Multiplanar CT image reconstructions of the cervical spine were also generated. RADIATION DOSE REDUCTION: This exam was performed according to the departmental dose-optimization program which includes automated exposure control, adjustment of the mA and/or kV according to patient size and/or use of iterative reconstruction technique. COMPARISON:  11/12/2019 FINDINGS: CT  HEAD FINDINGS Brain: No evidence of acute infarction, hemorrhage, hydrocephalus, extra-axial collection or mass lesion/mass effect. Mild periventricular white matter hypodensity. Vascular: No hyperdense vessel or unexpected calcification. Skull: Normal. Negative for fracture or focal lesion. Sinuses/Orbits: No acute finding. Other: None. CT CERVICAL SPINE FINDINGS Alignment: Normal. Skull base and vertebrae: No acute fracture of the cervical spine. Mildly displaced spinous process fracture of T2 (series 10, image 42) and wedge deformity of T3, described on separately reported examination of the chest, abdomen, and pelvis. No primary bone lesion or focal pathologic process. Soft  tissues and spinal canal: No prevertebral fluid or swelling. No visible canal hematoma. Disc levels: Focally mild disc space height loss and osteophytosis of C5-C6 with otherwise intact cervical disc spaces. Upper chest: Negative. Other: None. IMPRESSION: 1. No acute intracranial pathology. Minimal small-vessel white matter disease for patient age. 2. No fracture or static subluxation of the cervical spine proper. 3. Mildly displaced spinous process fracture of T2 and wedge deformity of T3, described on separately reported examination of the chest, abdomen, and pelvis. Electronically Signed   By: Marolyn JONETTA Jaksch M.D.   On: 01/21/2023 18:36   DG Tibia/Fibula Left Result Date: 01/21/2023 CLINICAL DATA:  Left leg pain following fall, initial encounter EXAM: LEFT TIBIA AND FIBULA - 2 VIEW COMPARISON:  None Available. FINDINGS: Postsurgical changes are noted in the distal left femur. Degenerative changes of the knee joint are noted. No acute fracture or dislocation is seen. IMPRESSION: No acute abnormality noted. Electronically Signed   By: Oneil Devonshire M.D.   On: 01/21/2023 17:13   DG Hip Unilat W or Wo Pelvis 2-3 Views Right Result Date: 01/21/2023 CLINICAL DATA:  Recent fall with right leg pain, initial encounter EXAM: DG HIP (WITH OR WITHOUT PELVIS) 3V RIGHT COMPARISON:  None Available. FINDINGS: Pelvic ring is intact. Postsurgical changes are noted in the proximal femurs bilaterally. No acute fracture or dislocation is noted. No soft tissue abnormality is seen. IMPRESSION: No acute abnormality noted Electronically Signed   By: Oneil Devonshire M.D.   On: 01/21/2023 17:12   DG Chest 1 View Result Date: 01/21/2023 CLINICAL DATA:  Fall with right leg weakness EXAM: CHEST  1 VIEW COMPARISON:  11/16/2014 FINDINGS: Numerous leads and wires project over the chest. Pacer. Midline trachea. Normal heart size. Atherosclerosis in the transverse aorta. No pleural effusion or pneumothorax. Clear lungs. IMPRESSION: No acute  cardiopulmonary disease. Aortic Atherosclerosis (ICD10-I70.0). Electronically Signed   By: Rockey Kilts M.D.   On: 01/21/2023 17:12    Procedures Procedures    Medications Ordered in ED Medications  Tdap (BOOSTRIX) injection 0.5 mL (has no administration in time range)  iohexol  (OMNIPAQUE ) 350 MG/ML injection 75 mL (75 mLs Intravenous Contrast Given 01/21/23 1759)    ED Course/ Medical Decision Making/ A&P                                 Medical Decision Making Amount and/or Complexity of Data Reviewed Labs: ordered. Radiology: ordered.  Risk Prescription drug management.   Robin Atkins is a 89 y.o. female with comorbidities that complicate the patient evaluation including  sick sinus syndrome status post pacemaker and hyperlipidemia who presents emergency department after a fall.    spinous process fracture of T2.  2. Mild, acute appearing superior endplate wedge deformity of T3,    Initial Ddx:  TBI, C-spine injury, thoracic/intra-abdominal fracture, left hip/fib fracture, right hip fracture  MDM/Course:  Patient presents to the emergency department after coming in and falling down 10 stairs.  Patient is complaining of neck pain as well as pain in her right hip.  Is neuro intact at this time.  Does have an abrasion to her left lower extremity.  She had trauma pan scans given the mechanism of injury and her tetanus shot updated.  Trauma scans did show that she has a spinous fracture of T2 as well as a superior endplate fracture of T3.  Did show some common hepatic duct and intrahepatic biliary duct dilation that is concerning for possible infiltrative biliary malignancy.  Patient was informed of these findings and will have her follow-up with GI and oncology as an outpatient.  Pain was under control and she was able to ambulate without difficulty.  Will have her follow-up with spine.   This patient presents to the ED for concern of complaints listed in HPI, this involves an  extensive number of treatment options, and is a complaint that carries with it a high risk of complications and morbidity. Disposition including potential need for admission considered.   Dispo: DC Home. Return precautions discussed including, but not limited to, those listed in the AVS. Allowed pt time to ask questions which were answered fully prior to dc.  Additional history obtained from spouse Records reviewed Outpatient Clinic Notes The following labs were independently interpreted: Chemistry and show no acute abnormality I independently reviewed the following imaging with scope of interpretation limited to determining acute life threatening conditions related to emergency care: Chest x-ray and agree with the radiologist interpretation with the following exceptions: none I personally reviewed and interpreted the pt's EKG: see above for interpretation  I have reviewed the patients home medications and made adjustments as needed Social Determinants of health:  Elderly  Portions of this note were generated with Scientist, clinical (histocompatibility and immunogenetics). Dictation errors may occur despite best attempts at proofreading.    sick sinus syndrome status post pacemaker and hyperlipidemia who presents emergency department after a fall.  Final Clinical Impression(s) / ED Diagnoses Final diagnoses:  Closed fracture of thoracic vertebra, unspecified fracture morphology, unspecified thoracic vertebral level, initial encounter [facility])  Hepatic lesion    Rx / DC Orders ED Discharge Orders          Ordered    Ambulatory referral to Gastroenterology       Comments: Eval for ERCP   01/21/23 2059    Ambulatory referral to Hematology / Oncology        01/21/23 2059              Yolande Lamar BROCKS, MD 01/21/23 2124

## 2023-01-21 NOTE — ED Triage Notes (Signed)
 BIB [facility] from home for a fall on thinners, Patient lost balance and fell down 10 steps and fell onto left side of the hip. Movement in all extremities. Pain 2/10 at this time

## 2023-01-21 NOTE — ED Notes (Signed)
 Pt ambulated well with assistance, usually pt ambulates with a cane,

## 2023-01-22 ENCOUNTER — Encounter: Payer: Self-pay | Admitting: Oncology

## 2023-01-22 NOTE — Progress Notes (Signed)
 [facility]  Diagnostic Nurse Navigator Telephone Note:  Initial Encounter  01/22/23  Patient Name:  Robin Atkins Patient MRN:  [id number] Patient DOB:  [id number]   I contacted Robin Atkins regarding their hematology/oncology referral from Dr. Lamar Shan for purpose of evaluation and work up of biliary mass.  I introduced myself by phone and confirmed the patient's identity using two identifiers; introduced myself, my role, and reason for contact.  Robin Atkins was not aware of her referral and reason.  Information on reason for referral was not necessary.  The [facility]) was explained to the patient, including the intent being to complete a diagnostic/prognostic plan of care for the patient's findings concerning for cancer after a face-to-face visit and physical assessment in clinic by the [facility] APP and/or the supervising physician.  Pertinent questions were addressed to the patient's satisfaction at this time.    The patient is aware of [facility] appointment details and of the intent of the visit(s).  Care coordination during this diagnostic process will be performed by the [facility] Diagnostic Navigator.  We look forward to meeting Robin Atkins to complete her diagnostic work-up and coordination of subsequent care post-diagnosis as appropriate.  [facility] Care Team Info:  Best contact/way to contact patient:  Mobile [facility] updated to reflect?:  not applicable Is there a HC POA?:  Not asked [facility] Transportation Screen completed:  Yes Electronic Welcome Letter reviewed and sent:  No  [facility] Appointment Details Provided at Time of Call: Appt Provider:  Mallie Appt Date:  01/24/2023 Appt Time:  1230 Comment:   Referral Investigation: Brief HPI:  On [facility] chart review, noted that patient was seen in ED on 01/21/2023 for fall from standing down 10 steps at home.  Trauma work-up  revealed incidental finding and reason for referral in CT below.     Addt'l Referrals Placed:  Yes: GI Addt'l Imaging or Procedures Placed:  No   Imaging Supporting Referral:   01/21/2023 CT CHEST ABDOMEN PELVIS W CONTRAST IMPRESSION: 1. Mildly displaced spinous process fracture of T2. 2. Mild, acute appearing superior endplate wedge deformity of T3, approximately 25% anterior height loss. Correlate for acute pain and point tenderness. 3. Superior endplate deformity of T10, uncertain acuity. Correlate for acute pain and point tenderness. MRI may be used to assess for marrow edema and fracture acuity if desired. 4. No other CT evidence of acute traumatic injury to the chest, abdomen, or pelvis. Nonacute subcapsular fluid collection about the spleen and superior endplate deformity of L3 seen on prior examination dated 2018. 5. Dilatation of the common bile duct within the pancreatic head measuring up to 1.3 cm in caliber. Soft tissue thickening about the common hepatic duct and intrahepatic biliary ducts. This appearance is new when compared to examination dated 2018 and of uncertain significance although concerning for infiltrative biliary malignancy. Consider ERCP to further evaluate if clinically appropriate given very advanced patient age.   Aortic Atherosclerosis (ICD10-I70.0).   Age Related Cancer Screenings:   Colonoscopy or Cologuard (M/F > 45):  04/10/2001 colonoscopy noted on chart review Mammography (F > 40):  not found on chart review at this time PAP or HPV (F > 25):  not found on chart review at this time Diagnostic Navigator:  Robin JONETTA Few, RN  [facility]

## 2023-01-23 NOTE — Progress Notes (Signed)
 [facility] [phone]   [phone]  INITIAL CONSULTATION:  Patient Care Team: Shayne Anes, MD as PCP - General (Internal Medicine) Mealor, Eulas BRAVO, MD as PCP - Electrophysiology (Cardiology)  CHIEF COMPLAINTS/PURPOSE OF CONSULTATION:  Common bile duct dilation  HISTORY OF PRESENTING ILLNESS:  Robin Atkins 89 y.o. female with medical history significant for sick sinus syndrome, mitral valve disorder, GERD, hypothyroidism, osteoporosis, degenerative disc disease, hyperlipidemia.  On review of the previous records patient is being referred by ED provider Dr. Yolande. Patient was evaluated 01/21/23 after a mechanical fall. As a result of the fall she had trauma scans and there was an incidental finding of common hepatic duct and intrahepatic biliary duct dilation to 1.3 cm as well as soft tissue thickening around the common hepatic duct and intrahepatic biliary ducts. Findings could be concerning for possible infiltrative biliary malignancy.  CMP from ED visit shows normal liver enzymes and T. bili. Referral was sent to GI.  On exam today patient is accompanied by her spouse and neighbor. She is currently asymptomatic. She denies any recent history of fever, abdominal pain, nausea, vomiting, dark urine, itching, change in appetite, unintentional weight loss. She is a never smoker and does not drink alcohol .  She reports history of normal colonoscopies and mammograms. She does not plan to have future screenings based on her age. Family history includes a sister likely had lymphoma although did not complete the work up.  MEDICAL HISTORY:  Past Medical History:  Diagnosis Date   Benign recurrent vertigo    Broken foot    Broken right foot and broken left knee in 2011   Cataracts, bilateral    DDD (degenerative disc disease), lumbar    Diverticulosis    GERD (gastroesophageal reflux disease)    Hemorrhoids    Hx of  migraine headaches    Hyperlipidemia    Hypothyroidism    Knee cap dislocation 2011   left   Lumbar radiculopathy    MVP (mitral valve prolapse)    OP (osteoporosis)    S/P cardiac pacemaker procedure    Original implant 1998 with generator change in 2005. She has had prior lead revision.    Shingles    SSS (sick sinus syndrome) (HCC)    s/p PTVP   Wrist fracture    lt    SURGICAL HISTORY: Past Surgical History:  Procedure Laterality Date   CARDIOVASCULAR STRESS TEST  07/10/2005   EF 78%, NO ISCHEMIA   HEMORROIDECTOMY     ORIF FEMUR FRACTURE Right 04/29/2012   Procedure: OPEN REDUCTION INTERNAL FIXATION (ORIF) DISTAL FEMUR FRACTURE;  Surgeon: Fonda SHAUNNA Olmsted, MD;  Location: [facility] OR;  Service: Orthopedics;  Laterality: Right;  biomet long condylar plate, flat jackson, cerclage wires, big c arm   PACEMAKER GENERATOR CHANGE  02/09/13   MDT Adapta L gen change by Dr Kelsie   PACEMAKER GENERATOR CHANGE N/A 02/09/2013   Procedure: PACEMAKER GENERATOR CHANGE;  Surgeon: Lynwood JONETTA Kelsie, MD;  Location: [facility] CATH LAB;  Service: Cardiovascular;  Laterality: N/A;   PACEMAKER INSERTION  1998/2005   TRANSVENOUS PACEMAKER   TOTAL HIP ARTHROPLASTY     right   US  ECHOCARDIOGRAPHY  11/25/2008   EF 55-60%; mild to moderate AI    SOCIAL HISTORY: Social History   Socioeconomic History   Marital status: Married    Spouse name: Not on file   Number of children: 2   Years of education: Not on file  Highest education level: Not on file  Occupational History   Occupation: retired  Tobacco Use   Smoking status: Never   Smokeless tobacco: Never  Vaping Use   Vaping status: Never Used  Substance and Sexual Activity   Alcohol  use: No   Drug use: No   Sexual activity: Not on file  Other Topics Concern   Not on file  Social History Narrative   Not on file   Social Drivers of Health   Financial Resource Strain: Not on file  Food Insecurity: Not on file  Transportation Needs: Not on file   Physical Activity: Not on file  Stress: Not on file  Social Connections: Not on file  Intimate Partner Violence: Not on file    FAMILY HISTORY: Family History  Problem Relation Age of Onset   Heart attack Mother    Cancer Father        brain tumour   Lung cancer Brother        x2   Diabetes Sister        x 2 brothers   Lymphoma Sister     ALLERGIES:  is allergic to betadine [povidone iodine], fosamax [alendronate sodium], glucosamine, lipitor [atorvastatin], parathyroid hormone (recomb), quinolones, zetia [ezetimibe], and zocor  [simvastatin ].  MEDICATIONS:  Current Outpatient Medications  Medication Sig Dispense Refill   acetaminophen  (TYLENOL ) 500 MG tablet Take 1,000 mg by mouth daily.     Cholecalciferol (VITAMIN D -3 PO) Take 1 tablet by mouth daily with lunch.     Cyanocobalamin (VITAMIN B-12 PO) Take 1 tablet by mouth daily with lunch.     levothyroxine  (SYNTHROID ) 75 MCG tablet Take 75 mcg by mouth daily before breakfast. Takes 75mcg 6 days per week. On the 7th day does not take anything.     Multiple Vitamins-Minerals (MULTIVITAMIN WOMEN 50+) TABS Take 1 tablet by mouth daily with lunch.     pravastatin  (PRAVACHOL ) 20 MG tablet Take 20 mg by mouth at bedtime.      traMADol  (ULTRAM ) 50 MG tablet Take 50 mg by mouth 2 (two) times daily as needed for severe pain (pain score 7-10).     No current facility-administered medications for this visit.    REVIEW OF SYSTEMS:   All other systems are reviewed and are negative for acute change except as noted in the HPI.  PHYSICAL EXAMINATION: ECOG PERFORMANCE STATUS: 0 - Asymptomatic  Vitals:   01/24/23 1222 01/24/23 1223  BP: (!) 181/68 (!) 180/66  Pulse: 66   Resp: 15   Temp: 97.8 F (36.6 C)   SpO2: 100%    Filed Weights   01/24/23 1222  Weight: 117 lb 6.4 oz (53.3 kg)    Physical Exam Vitals reviewed.  Constitutional:      Appearance: Normal appearance. She is not ill-appearing or toxic-appearing.  HENT:      Head: Normocephalic.     Nose: Nose normal.  Eyes:     General: No scleral icterus.    Conjunctiva/sclera: Conjunctivae normal.  Cardiovascular:     Rate and Rhythm: Normal rate and regular rhythm.     Pulses: Normal pulses.     Heart sounds: Normal heart sounds.  Pulmonary:     Effort: Pulmonary effort is normal.     Breath sounds: Normal breath sounds.  Abdominal:     General: There is no distension.     Palpations: Abdomen is soft.     Tenderness: There is no abdominal tenderness.  Musculoskeletal:  General: Normal range of motion.     Cervical back: Normal range of motion.  Skin:    General: Skin is warm and dry.     Coloration: Skin is not jaundiced.  Neurological:     Mental Status: She is alert.       LABORATORY DATA:  I have reviewed the data as listed    Latest Ref Rng & Units 01/24/2023    1:38 PM 01/21/2023    5:34 PM 01/21/2023    5:00 PM  CBC  WBC 4.0 - 10.5 K/uL 7.5   6.6   Hemoglobin 12.0 - 15.0 g/dL 89.4  89.7  89.9   Hematocrit 36.0 - 46.0 % 31.5  30.0  31.0   Platelets 150 - 400 K/uL 151   141        Latest Ref Rng & Units 01/24/2023    1:38 PM 01/21/2023    5:34 PM 01/21/2023    5:00 PM  CMP  Glucose 70 - 99 mg/dL 86  885  876   BUN 8 - 23 mg/dL 20  18  17    Creatinine 0.44 - 1.00 mg/dL 9.23  9.29  9.20   Sodium 135 - 145 mmol/L 133  136  136   Potassium 3.5 - 5.1 mmol/L 4.5  3.9  4.0   Chloride 98 - 111 mmol/L 101  102  103   CO2 22 - 32 mmol/L 28   25   Calcium 8.9 - 10.3 mg/dL 9.6   9.1   Total Protein 6.5 - 8.1 g/dL 6.6   5.9   Total Bilirubin 0.0 - 1.2 mg/dL 0.5   0.6   Alkaline Phos 38 - 126 U/L 73   75   AST 15 - 41 U/L 25   33   ALT 0 - 44 U/L 31   30      RADIOGRAPHIC STUDIES: I have personally reviewed the radiological images as listed and agreed with the findings in the report. CT CHEST ABDOMEN PELVIS W CONTRAST Result Date: 01/21/2023 CLINICAL DATA:  Blunt trauma * Tracking Code: BO * EXAM: CT CHEST, ABDOMEN, AND PELVIS WITH  CONTRAST TECHNIQUE: Multidetector CT imaging of the chest, abdomen and pelvis was performed following the standard protocol during bolus administration of intravenous contrast. RADIATION DOSE REDUCTION: This exam was performed according to the departmental dose-optimization program which includes automated exposure control, adjustment of the mA and/or kV according to patient size and/or use of iterative reconstruction technique. CONTRAST:  75mL OMNIPAQUE  IOHEXOL  350 MG/ML SOLN COMPARISON:  06/06/2016 FINDINGS: CT CHEST FINDINGS Cardiovascular: Aortic atherosclerosis. Right chest multi lead pacer. Normal heart size. No pericardial effusion. Mediastinum/Nodes: No enlarged mediastinal, hilar, or axillary lymph nodes. Thyroid  gland, trachea, and esophagus demonstrate no significant findings. Lungs/Pleura: Bandlike scarring of the left lung base. No pleural effusion or pneumothorax. Musculoskeletal: No chest wall abnormality. Mildly displaced spinous process fracture of T2 (series 7, image 63). Mild, acute appearing superior endplate wedge deformity of T3, approximately 25% anterior height loss. Superior endplate deformity of T10, uncertain acuity. Nonacute superior endplate deformity of L3, as seen by prior examination dated 06/06/2016. CT ABDOMEN PELVIS FINDINGS Hepatobiliary: No solid liver abnormality is seen. Dilatation of the common bile duct within the pancreatic head measuring up to 1.3 cm in caliber (series 6, image 58). Soft tissue thickening about the common hepatic duct and intrahepatic biliary ducts (series 6, image 59). Pancreas: Unremarkable. No pancreatic ductal dilatation or surrounding inflammatory changes. Spleen: Normal in size. Small, chronic  subcapsular fluid collection about the spleen, unchanged compared to prior examinatin dated 06/06/2016. Adrenals/Urinary Tract: Adrenal glands are unremarkable. Kidneys are normal, without renal calculi, solid lesion, or hydronephrosis. Bladder is unremarkable.  Stomach/Bowel: Stomach is within normal limits. Appendix appears normal. No evidence of bowel wall thickening, distention, or inflammatory changes. Vascular/Lymphatic: Severe aortic atherosclerosis. No enlarged abdominal or pelvic lymph nodes. Reproductive: No mass or other abnormality. Other: No abdominal wall hernia or abnormality. No ascites. Musculoskeletal: No acute osseous findings. Right hip arthroplasty. Left hip intramedullary nail fixation. IMPRESSION: 1. Mildly displaced spinous process fracture of T2. 2. Mild, acute appearing superior endplate wedge deformity of T3, approximately 25% anterior height loss. Correlate for acute pain and point tenderness. 3. Superior endplate deformity of T10, uncertain acuity. Correlate for acute pain and point tenderness. MRI may be used to assess for marrow edema and fracture acuity if desired. 4. No other CT evidence of acute traumatic injury to the chest, abdomen, or pelvis. Nonacute subcapsular fluid collection about the spleen and superior endplate deformity of L3 seen on prior examination dated 2018. 5. Dilatation of the common bile duct within the pancreatic head measuring up to 1.3 cm in caliber. Soft tissue thickening about the common hepatic duct and intrahepatic biliary ducts. This appearance is new when compared to examination dated 2018 and of uncertain significance although concerning for infiltrative biliary malignancy. Consider ERCP to further evaluate if clinically appropriate given very advanced patient age. Aortic Atherosclerosis (ICD10-I70.0). Electronically Signed   By: Marolyn JONETTA Jaksch M.D.   On: 01/21/2023 18:54   CT HEAD WO CONTRAST Result Date: 01/21/2023 CLINICAL DATA:  Head and neck trauma EXAM: CT HEAD WITHOUT CONTRAST CT CERVICAL SPINE WITHOUT CONTRAST TECHNIQUE: Multidetector CT imaging of the head and cervical spine was performed following the standard protocol without intravenous contrast. Multiplanar CT image reconstructions of the cervical  spine were also generated. RADIATION DOSE REDUCTION: This exam was performed according to the departmental dose-optimization program which includes automated exposure control, adjustment of the mA and/or kV according to patient size and/or use of iterative reconstruction technique. COMPARISON:  11/12/2019 FINDINGS: CT HEAD FINDINGS Brain: No evidence of acute infarction, hemorrhage, hydrocephalus, extra-axial collection or mass lesion/mass effect. Mild periventricular white matter hypodensity. Vascular: No hyperdense vessel or unexpected calcification. Skull: Normal. Negative for fracture or focal lesion. Sinuses/Orbits: No acute finding. Other: None. CT CERVICAL SPINE FINDINGS Alignment: Normal. Skull base and vertebrae: No acute fracture of the cervical spine. Mildly displaced spinous process fracture of T2 (series 10, image 42) and wedge deformity of T3, described on separately reported examination of the chest, abdomen, and pelvis. No primary bone lesion or focal pathologic process. Soft tissues and spinal canal: No prevertebral fluid or swelling. No visible canal hematoma. Disc levels: Focally mild disc space height loss and osteophytosis of C5-C6 with otherwise intact cervical disc spaces. Upper chest: Negative. Other: None. IMPRESSION: 1. No acute intracranial pathology. Minimal small-vessel white matter disease for patient age. 2. No fracture or static subluxation of the cervical spine proper. 3. Mildly displaced spinous process fracture of T2 and wedge deformity of T3, described on separately reported examination of the chest, abdomen, and pelvis. Electronically Signed   By: Marolyn JONETTA Jaksch M.D.   On: 01/21/2023 18:36   CT CERVICAL SPINE WO CONTRAST Result Date: 01/21/2023 CLINICAL DATA:  Head and neck trauma EXAM: CT HEAD WITHOUT CONTRAST CT CERVICAL SPINE WITHOUT CONTRAST TECHNIQUE: Multidetector CT imaging of the head and cervical spine was performed following the standard protocol without intravenous  contrast. Multiplanar CT image reconstructions of the cervical spine were also generated. RADIATION DOSE REDUCTION: This exam was performed according to the departmental dose-optimization program which includes automated exposure control, adjustment of the mA and/or kV according to patient size and/or use of iterative reconstruction technique. COMPARISON:  11/12/2019 FINDINGS: CT HEAD FINDINGS Brain: No evidence of acute infarction, hemorrhage, hydrocephalus, extra-axial collection or mass lesion/mass effect. Mild periventricular white matter hypodensity. Vascular: No hyperdense vessel or unexpected calcification. Skull: Normal. Negative for fracture or focal lesion. Sinuses/Orbits: No acute finding. Other: None. CT CERVICAL SPINE FINDINGS Alignment: Normal. Skull base and vertebrae: No acute fracture of the cervical spine. Mildly displaced spinous process fracture of T2 (series 10, image 42) and wedge deformity of T3, described on separately reported examination of the chest, abdomen, and pelvis. No primary bone lesion or focal pathologic process. Soft tissues and spinal canal: No prevertebral fluid or swelling. No visible canal hematoma. Disc levels: Focally mild disc space height loss and osteophytosis of C5-C6 with otherwise intact cervical disc spaces. Upper chest: Negative. Other: None. IMPRESSION: 1. No acute intracranial pathology. Minimal small-vessel white matter disease for patient age. 2. No fracture or static subluxation of the cervical spine proper. 3. Mildly displaced spinous process fracture of T2 and wedge deformity of T3, described on separately reported examination of the chest, abdomen, and pelvis. Electronically Signed   By: Marolyn JONETTA Jaksch M.D.   On: 01/21/2023 18:36   DG Tibia/Fibula Left Result Date: 01/21/2023 CLINICAL DATA:  Left leg pain following fall, initial encounter EXAM: LEFT TIBIA AND FIBULA - 2 VIEW COMPARISON:  None Available. FINDINGS: Postsurgical changes are noted in the distal  left femur. Degenerative changes of the knee joint are noted. No acute fracture or dislocation is seen. IMPRESSION: No acute abnormality noted. Electronically Signed   By: Oneil Devonshire M.D.   On: 01/21/2023 17:13   DG Hip Unilat W or Wo Pelvis 2-3 Views Right Result Date: 01/21/2023 CLINICAL DATA:  Recent fall with right leg pain, initial encounter EXAM: DG HIP (WITH OR WITHOUT PELVIS) 3V RIGHT COMPARISON:  None Available. FINDINGS: Pelvic ring is intact. Postsurgical changes are noted in the proximal femurs bilaterally. No acute fracture or dislocation is noted. No soft tissue abnormality is seen. IMPRESSION: No acute abnormality noted Electronically Signed   By: Oneil Devonshire M.D.   On: 01/21/2023 17:12   DG Chest 1 View Result Date: 01/21/2023 CLINICAL DATA:  Fall with right leg weakness EXAM: CHEST  1 VIEW COMPARISON:  11/16/2014 FINDINGS: Numerous leads and wires project over the chest. Pacer. Midline trachea. Normal heart size. Atherosclerosis in the transverse aorta. No pleural effusion or pneumothorax. Clear lungs. IMPRESSION: No acute cardiopulmonary disease. Aortic Atherosclerosis (ICD10-I70.0). Electronically Signed   By: Rockey Kilts M.D.   On: 01/21/2023 17:12    ASSESSMENT & PLAN TAUHEEDAH BOK is a 89 y.o. female presenting to the [facility] for consultation regarding common bile duct dilation. We have reviewed etiologies including malignancy, gallstones, papillary stenosis. Patient will proceed with laboratory workup today.   #Bile Duct Dilation -Reviewed findings from ED CT CAP with patient and spouse. Physical exam today reveal benign abdomen. - Will collect labs today including CBC, CMP, Atkins, CA 19.9, and chromogranin. -Patient is not a candidate for MRCP because of her pacemaker. Referral was already sent to GI be the ED provider. We will contact GI directly to discuss further work up including possible ERCP.  #Age related screenings -UTD.  -Patient will RTC when  work up is complete.  Patient expressed understanding of the recommended workup and is agreeable to move forward.   All questions were answered. The patient knows to call the clinic with any problems, questions or concerns.  Shared visit with Dr. Federico.  Orders Placed This Encounter  Procedures   CBC with Differential (Cancer Center Only)    Standing Status:   Future    Number of Occurrences:   1    Expiration Date:   01/24/2024   CMP (Cancer Center only)    Standing Status:   Future    Number of Occurrences:   1    Expiration Date:   01/24/2024   Atkins (Access)-[facility] ONLY    Standing Status:   Future    Number of Occurrences:   1    Expiration Date:   01/24/2024   CA 19.9    Standing Status:   Future    Number of Occurrences:   1    Expiration Date:   01/24/2024   Chromogranin A    Standing Status:   Future    Number of Occurrences:   1    Expected Date:   01/24/2023    Expiration Date:   01/24/2024      I have spent a total of 60 minutes minutes of face-to-face and non-face-to-face time, preparing to see the patient, obtaining and/or reviewing separately obtained history, performing a medically appropriate examination, counseling and educating the patient, ordering medications/tests/procedures, referring and communicating with other health care professionals, documenting clinical information in the electronic health record, independently interpreting results and communicating results to the patient, and care coordination.   Annasofia Pohl Walisiewicz PA-C Department of Hematology/Oncology [facility] at [facility] Phone: [phone]  I have read the above note and personally examined the patient. I agree with the assessment and plan as noted above.  Briefly Robin Atkins is a 89 year old female who presents for evaluation of a dilated common bile duct.  At this time there is no mass identified, though there is concern of a possible pancreatic lesion.  MRCP  unfortunately is not feasible due to this patient's pacemaker.  Will reach out to gastroenterology to see if an EUS versus ERCP would be indicated in her case.  Fortunately the patient is asymptomatic and has normal LFTs.  The patient voiced understanding of our findings and plan moving forward.   Norleen IVAR Federico, MD Department of Hematology/Oncology [facility] at [facility] Phone: [phone] Pager: [phone] Email: [email or web address]

## 2023-01-24 ENCOUNTER — Inpatient Hospital Stay: Payer: Medicare Other | Admitting: Physician Assistant

## 2023-01-24 ENCOUNTER — Inpatient Hospital Stay: Payer: Medicare Other | Attending: Physician Assistant | Admitting: Physician Assistant

## 2023-01-24 VITALS — BP 180/66 | HR 66 | Temp 97.8°F | Resp 15 | Wt 117.4 lb

## 2023-01-24 DIAGNOSIS — K838 Other specified diseases of biliary tract: Secondary | ICD-10-CM | POA: Insufficient documentation

## 2023-01-24 DIAGNOSIS — Z950 Presence of cardiac pacemaker: Secondary | ICD-10-CM | POA: Diagnosis not present

## 2023-01-24 DIAGNOSIS — Z801 Family history of malignant neoplasm of trachea, bronchus and lung: Secondary | ICD-10-CM | POA: Diagnosis not present

## 2023-01-24 DIAGNOSIS — Z808 Family history of malignant neoplasm of other organs or systems: Secondary | ICD-10-CM | POA: Diagnosis not present

## 2023-01-24 DIAGNOSIS — Z807 Family history of other malignant neoplasms of lymphoid, hematopoietic and related tissues: Secondary | ICD-10-CM | POA: Diagnosis not present

## 2023-01-24 LAB — CMP (CANCER CENTER ONLY)
ALT: 31 U/L (ref 0–44)
AST: 25 U/L (ref 15–41)
Albumin: 3.9 g/dL (ref 3.5–5.0)
Alkaline Phosphatase: 73 U/L (ref 38–126)
Anion gap: 4 — ABNORMAL LOW (ref 5–15)
BUN: 20 mg/dL (ref 8–23)
CO2: 28 mmol/L (ref 22–32)
Calcium: 9.6 mg/dL (ref 8.9–10.3)
Chloride: 101 mmol/L (ref 98–111)
Creatinine: 0.76 mg/dL (ref 0.44–1.00)
GFR, Estimated: >60 mL/min (ref >60)
Glucose, Bld: 86 mg/dL (ref 70–99)
Potassium: 4.5 mmol/L (ref 3.5–5.1)
Sodium: 133 mmol/L — ABNORMAL LOW (ref 135–145)
Total Bilirubin: 0.5 mg/dL (ref 0.0–1.2)
Total Protein: 6.6 g/dL (ref 6.5–8.1)

## 2023-01-24 LAB — CBC WITH DIFFERENTIAL (CANCER CENTER ONLY)
Abs Immature Granulocytes: 0.04 10*3/uL (ref 0.00–0.07)
Basophils Absolute: 0 10*3/uL (ref 0.0–0.1)
Basophils Relative: 0 %
Eosinophils Absolute: 0.1 10*3/uL (ref 0.0–0.5)
Eosinophils Relative: 1 %
HCT: 31.5 % — ABNORMAL LOW (ref 36.0–46.0)
Hemoglobin: 10.5 g/dL — ABNORMAL LOW (ref 12.0–15.0)
Immature Granulocytes: 1 %
Lymphocytes Relative: 6 %
Lymphs Abs: 0.5 10*3/uL — ABNORMAL LOW (ref 0.7–4.0)
MCH: 30.8 pg (ref 26.0–34.0)
MCHC: 33.3 g/dL (ref 30.0–36.0)
MCV: 92.4 fL (ref 80.0–100.0)
Monocytes Absolute: 0.8 10*3/uL (ref 0.1–1.0)
Monocytes Relative: 10 %
Neutro Abs: 6.1 10*3/uL (ref 1.7–7.7)
Neutrophils Relative %: 82 %
Platelet Count: 151 10*3/uL (ref 150–400)
RBC: 3.41 MIL/uL — ABNORMAL LOW (ref 3.87–5.11)
RDW: 14.2 % (ref 11.5–15.5)
WBC Count: 7.5 10*3/uL (ref 4.0–10.5)
nRBC: 0 % (ref 0.0–0.2)

## 2023-01-24 LAB — CEA (ACCESS): CEA (CHCC): 6.89 ng/mL — ABNORMAL HIGH (ref 0.00–5.00)

## 2023-01-24 NOTE — Patient Instructions (Signed)
 Diagnostic Clinic Office Visit Discharge Information and Instructions  Thank you for choosing [facility] for your healthcare needs.  Below is a summary of today's discussion, along with our contact information and an outline of what to expect next.  Reason for Visit:  common bile duct dilation  Proposed Diagnostic Care Plan: Labs ordered today Referral sent to GI (gastrointestinal) provider to discuss next steps for imaging. We will call to let you know the plan.  What to Expect: - Generally, when lab tests are ordered the results can take up to 1 week for results to be available.  At that point, we will contact you to discuss your results with you.  Unless there is a critical result, we will typically wait for all of your lab results to be available before contacting you. - If a biopsy is part of your Care Plan, those results can take on average 7-10 days to result.  Once results are available, we will contact you to discuss your pathology results and any next steps. - If you have additional imaging ordered, such as a CT Scan, MRI, Ultrasound, Bone Scan, or PET scan, your imaging will need to be authorized then scheduled with the earliest available appointment.  You may be asked to travel to another hospital within [facility] who has a sooner availability, please consider doing so if asked. - If you use MyChart, your results will be available to you in the MyChart portal.  Your provider will be in touch with you as soon as all of your results are available to be discussed.  Your Diagnostic Clinic Provider:  Jaretzy Lhommedieu Walisiewicz PA-C and Dr. Federico Your Diagnostic Navigator:  Colene Raider, RN  If you or your caregiver have number blocking on your cell phones, please ensure the cancer center's numbers are not blocked.  If you are not a registered MyChart user, please consider enrolling in MyChart to receive your test results and visit notes.  You can also access your discharge instructions  electronically.  MyChart also gives you an electronic means to communicate with your Care Team instead of needing to call in to the cancer center.  We appreciate you trusting us  with your healthcare and look forward to partnering with you as we work to uncover what your potential diagnosis may be.  Please do not hesitate to reach out at any point with questions or concerns.

## 2023-01-25 ENCOUNTER — Telehealth: Payer: Self-pay

## 2023-01-25 NOTE — Telephone Encounter (Signed)
-----   Message from [facility] sent at 01/24/2023  5:19 PM EST ----- Regarding: RE: [facility] referral KEW, At 89 years of age, being found incidentally, this is likely nothing to worry about.  Certainly if her LFTs were abnormal I would be more concerned.  If her CA 19-9 is elevated I would be more concerned as well.  I would recommend you obtain an MRI/MRCP.  We can get her into see one of the APP's or GI docs to discuss if she would want to have any other invasive procedures, as I am not sure I will have availability before I go on leave to see her in clinic. I would not go direct to EUS in a 89 year old with normal LFTs without talking with her first. But reach back out if the labs are abnormal or the MRI CP is more concerning. Thanks. GM  Lujean Ebright, Please offer this patient a clinic visit with APP or myself. We will let the oncology team go ahead and order the imaging that I have recommended. Thanks. GM ----- Message ----- From: Walisiewicz, Kaitlyn E, PA-C Sent: 01/24/2023   4:16 PM EST To: Forestine JAYSON Raider, RN; # Subject: [facility] referral               Dr. Wilhelmenia,  This is another patient coming from the [facility] clinic. Patient is a 89 yo female with incidental finding of common bile duct dilation to 1.3 cm. It looks like the ED provider sent a GI referral for that finding. Are you able to review her chart and see her for further GI work up?  Thanks, Kaitlyn

## 2023-01-25 NOTE — Telephone Encounter (Signed)
 Appt made for 02/06/23 at 330 pm with Deanna May.    The patient has been notified of this information and all questions answered.

## 2023-01-26 LAB — CANCER ANTIGEN 19-9: CA 19-9: 39 U/mL — ABNORMAL HIGH (ref 0–35)

## 2023-01-29 LAB — CHROMOGRANIN A: Chromogranin A (ng/mL): 134.4 ng/mL — ABNORMAL HIGH (ref 0.0–101.8)

## 2023-02-04 ENCOUNTER — Telehealth: Payer: Self-pay | Admitting: Medical Oncology

## 2023-02-04 NOTE — Telephone Encounter (Signed)
[facility]  Patient had called requesting GI appointment for this week 22nd to be rescheduled due to expectation of inclement weather. Spoke with Dr. Elesa Hacker nurse, Patty at [facility] and patient was rescheduled to next earliest availability on 2/18th.  Call to patient to inform her of her new appointment. Patient expressed thanks and confirmation of new date and time. Patient denies questions at this time. Patient was provided with my name and contact information and was encouraged to call for any questions/concerns she may have.   Rexene Edison, RN, BSN, [facility] Oncology Nurse Navigator, [facility] 02/04/2023 10:40 AM

## 2023-02-06 ENCOUNTER — Ambulatory Visit: Payer: 59 | Admitting: Gastroenterology

## 2023-02-11 DIAGNOSIS — M19011 Primary osteoarthritis, right shoulder: Secondary | ICD-10-CM | POA: Diagnosis not present

## 2023-02-11 DIAGNOSIS — M19012 Primary osteoarthritis, left shoulder: Secondary | ICD-10-CM | POA: Diagnosis not present

## 2023-03-01 ENCOUNTER — Other Ambulatory Visit (HOSPITAL_COMMUNITY): Payer: Self-pay

## 2023-03-04 ENCOUNTER — Ambulatory Visit (HOSPITAL_COMMUNITY)
Admission: RE | Admit: 2023-03-04 | Discharge: 2023-03-04 | Disposition: A | Discharge status: Home / Self Care | Payer: Medicare Other | Source: Ambulatory Visit | Attending: Internal Medicine | Admitting: Internal Medicine

## 2023-03-04 DIAGNOSIS — M810 Age-related osteoporosis without current pathological fracture: Secondary | ICD-10-CM | POA: Diagnosis not present

## 2023-03-04 MED ORDER — DENOSUMAB 60 MG/ML ~~LOC~~ SOSY
PREFILLED_SYRINGE | SUBCUTANEOUS | Status: AC
  Administered 2023-03-04: 60 mg via SUBCUTANEOUS
  Filled 2023-03-04: qty 1

## 2023-03-04 MED ORDER — DENOSUMAB 60 MG/ML ~~LOC~~ SOSY: 60.0000 mg | PREFILLED_SYRINGE | Freq: Once | SUBCUTANEOUS | Status: AC

## 2023-03-04 NOTE — Progress Notes (Signed)
 This encounter was created in error - please disregard.

## 2023-03-04 NOTE — Progress Notes (Unsigned)
Chief Complaint:*** Primary GI Doctor:Dr. Marina Goodell  HPI:  Patient is a  89  year old [facility] patient with past medical history of GERD, CAD, sick sinus syndrome, mitral valve disorder, hypothyroidism,hyperlipidemia, DDD*****who was referred to me by Dr. Rolly Pancake, on 01/21/23 for evaluation for ERCP.  On 01/21/2023 patient presented to [facility] hospital after a fall.  Patient complaining of neck pain as well as pain in her right hip.  Trauma scans did show a spinous fracture of T2 as well as superior endplate fracture of T3.  Also some common hepatic duct and intrahepatic biliary duct dilatation that is concerning for possible and infiltrative of biliary malignancy.  CMP from ED visit shows normal liver enzymes and T. bili.  GI referral placed.  On 01/24/2023 patient seen by oncology, that time patient had no symptoms to report.  Lab work was collected including CEA, CA 19.9, and chromogranin. Patient is not a candidate for MRCP because of her pacemaker.   Interval History  Patient admits/denies GERD Patient admits/denies dysphagia Patient admits/denies nausea, vomiting, or weight loss  Patient admits/denies altered bowel habits Patient admits/denies abdominal pain Patient admits/denies rectal bleeding   Denies/Admits alcohol Denies/Admits smoking Denies/Admits NSAID use. Denies/Admits they are on blood thinners.  Patients last colonoscopy Patients last EGD  Patient's family history includes  Wt Readings from Last 3 Encounters:  01/24/23 117 lb 6.4 oz (53.3 kg)  05/01/22 119 lb 12.8 oz (54.3 kg)  04/28/21 118 lb (53.5 kg)      Past Medical History:  Diagnosis Date   Benign recurrent vertigo    Broken foot    Broken right foot and broken left knee in 2011   Cataracts, bilateral    DDD (degenerative disc disease), lumbar    Diverticulosis    GERD (gastroesophageal reflux disease)    Hemorrhoids    Hx of migraine headaches    Hyperlipidemia    Hypothyroidism    Knee cap  dislocation 2011   left   Lumbar radiculopathy    MVP (mitral valve prolapse)    OP (osteoporosis)    S/P cardiac pacemaker procedure    Original implant 1998 with generator change in 2005. She has had prior lead revision.    Shingles    SSS (sick sinus syndrome) (HCC)    s/p PTVP   Wrist fracture    lt    Past Surgical History:  Procedure Laterality Date   CARDIOVASCULAR STRESS TEST  07/10/2005   EF 78%, NO ISCHEMIA   HEMORROIDECTOMY     ORIF FEMUR FRACTURE Right 04/29/2012   Procedure: OPEN REDUCTION INTERNAL FIXATION (ORIF) DISTAL FEMUR FRACTURE;  Surgeon: Eulas Post, MD;  Location: [facility] OR;  Service: Orthopedics;  Laterality: Right;  biomet long condylar plate, flat jackson, cerclage wires, big c arm   PACEMAKER GENERATOR CHANGE  02/09/13   MDT Adapta L gen change by Dr [name]   PACEMAKER GENERATOR CHANGE N/A 02/09/2013   Procedure: PACEMAKER GENERATOR CHANGE;  Surgeon: Gardiner Rhyme, MD;  Location: [facility] CATH LAB;  Service: Cardiovascular;  Laterality: N/A;   PACEMAKER INSERTION  1998/2005   TRANSVENOUS PACEMAKER   TOTAL HIP ARTHROPLASTY     right   US ECHOCARDIOGRAPHY  Robin/Robin/2010   EF 55-60%; mild to moderate AI    Current Outpatient Medications  Medication Sig Dispense Refill   acetaminophen (TYLENOL) 500 MG tablet Take 1,000 mg by mouth daily.     Cholecalciferol (VITAMIN D-3 PO) Take 1 tablet by mouth daily with lunch.  Cyanocobalamin (VITAMIN B-12 PO) Take 1 tablet by mouth daily with lunch.     levothyroxine (SYNTHROID) 75 MCG tablet Take 75 mcg by mouth daily before breakfast. Takes 6 days per week. On the 7th day does not take anything.     Multiple Vitamins-Minerals (MULTIVITAMIN WOMEN 50+) TABS Take 1 tablet by mouth daily with lunch.     pravastatin (PRAVACHOL) 20 MG tablet Take 20 mg by mouth at bedtime.      traMADol (ULTRAM) 50 MG tablet Take 50 mg by mouth 2 (two) times daily as needed for severe pain (pain score 7-10).     No current  facility-administered medications for this visit.    Allergies as of 03/05/2023 - Review Complete 03/04/2023  Allergen Reaction Noted   Betadine [povidone iodine] Other (See Comments) 03/29/2020   Fosamax [alendronate sodium] Other (See Comments) 03/29/2020   Glucosamine Other (See Comments) 03/29/2020   Lipitor [atorvastatin] Other (See Comments) 03/29/2020   Parathyroid hormone (recomb) Other (See Comments) 03/29/2020   Quinolones Other (See Comments) 03/29/2020   Zetia [ezetimibe] Other (See Comments) 03/29/2020   Zocor [simvastatin] Other (See Comments) 03/29/2020    Family History  Problem Relation Age of Onset   Heart attack Mother    Cancer Father        brain tumour   Lung cancer Brother        x2   Diabetes Sister        x 2 brothers   Lymphoma Sister     Review of Systems:    Constitutional: No weight loss, fever, chills, weakness or fatigue HEENT: Eyes: No change in vision               Ears, Nose, Throat:  No change in hearing or congestion Skin: No rash or itching Cardiovascular: No chest pain, chest pressure or palpitations   Respiratory: No SOB or cough Gastrointestinal: See HPI and otherwise negative Genitourinary: No dysuria or change in urinary frequency Neurological: No headache, dizziness or syncope Musculoskeletal: No new muscle or joint pain Hematologic: No bleeding or bruising Psychiatric: No history of depression or anxiety    Physical Exam:  Vital signs: There were no vitals taken for this visit.  Constitutional:   Pleasant Caucasian [facility]*** appears to be in NAD, Well developed, Well nourished, alert and cooperative Head:  Normocephalic and atraumatic. Eyes:   PEERL, EOMI. No icterus. Conjunctiva pink. Ears:  Normal auditory acuity. Neck:  Supple Throat: Oral cavity and pharynx without inflammation, swelling or lesion.  Respiratory: Respirations even and unlabored. Lungs clear to auscultation bilaterally.   No wheezes, crackles, or  rhonchi.  Cardiovascular: Normal S1, S2. Regular rate and rhythm. No peripheral edema, cyanosis or pallor.  Gastrointestinal:  Soft, nondistended, nontender. No rebound or guarding. Normal bowel sounds. No appreciable masses or hepatomegaly. Rectal:  Not performed.  Anoscopy: Msk:  Symmetrical without gross deformities. Without edema, no deformity or joint abnormality.  Neurologic:  Alert and  oriented x4;  grossly normal neurologically.  Skin:   Dry and intact without significant lesions or rashes. Psychiatric: Oriented to person, place and time. Demonstrates good judgement and reason without abnormal affect or behaviors.  RELEVANT LABS AND IMAGING: CBC    Latest Ref Rng & Units 01/24/2023    1:38 PM 01/21/2023    5:34 PM 01/21/2023    5:00 PM  CBC  WBC 4.0 - 10.5 K/uL 7.5   6.6   Hemoglobin 12.0 - 15.0 g/dL 78.4  69.6  10.0  Hematocrit 36.0 - 46.0 % 31.5  30.0  31.0   Platelets 150 - 400 K/uL 151   141      CMP     Latest Ref Rng & Units 01/24/2023    1:38 PM 01/21/2023    5:34 PM 01/21/2023    5:00 PM  CMP  Glucose 70 - 99 mg/dL 86  161  096   BUN 8 - 23 mg/dL 20  18  17    Creatinine 0.44 - 1.00 mg/dL 0.45  4.09  8.Robin   Sodium 135 - 145 mmol/L 133  136  136   Potassium 3.5 - 5.1 mmol/L 4.5  3.9  4.0   Chloride 98 - 111 mmol/L 101  102  103   CO2 22 - 32 mmol/L 28   25   Calcium 8.9 - 10.3 mg/dL 9.6   9.1   Total Protein 6.5 - 8.1 g/dL 6.6   5.9   Total Bilirubin 0.0 - 1.2 mg/dL 0.5   0.6   Alkaline Phos 38 - 126 U/L 73   75   AST 15 - 41 U/L 25   33   ALT 0 - 44 U/L 31   30      Lab Results  Component Value Date   TSH 1.237 04/29/2012  01/24/23 Chromogranin A 134.4, CEA 6.89, CA 19.9 (39) 04/06/2001 colonoscopy with Dr. Marina Goodell Impression Normal exam; cecum Diverticulosis; descending colon sigmoid colon. Hemorrhoids internal  01/21/23 CT Chest/ABD/pelvis W contrast IMPRESSION: 1. Mildly displaced spinous process fracture of T2. 2. Mild, acute appearing superior endplate  wedge deformity of T3, approximately 25% anterior height loss. Correlate for acute pain and point tenderness. 3. Superior endplate deformity of T10, uncertain acuity. Correlate for acute pain and point tenderness. MRI Rainelle Sulewski be used to assess for marrow edema and fracture acuity if desired. 4. No other CT evidence of acute traumatic injury to the chest, abdomen, or pelvis. Nonacute subcapsular fluid collection about the spleen and superior endplate deformity of L3 seen on prior examination dated 2018. 5. Dilatation of the common bile duct within the pancreatic head measuring up to 1.3 cm in caliber. Soft tissue thickening about the common hepatic duct and intrahepatic biliary ducts. This appearance is new when compared to examination dated 2018 and of uncertain significance although concerning for infiltrative biliary malignancy. Consider ERCP to further evaluate if clinically appropriate given very advanced patient age. Aortic Atherosclerosis (ICD10-I70.0). 01/21/23 CT HEAD WITHOUT CONTRAST /CT CERVICAL SPINE WITHOUT CONTRAST  IMPRESSION: 1. No acute intracranial pathology. Minimal small-vessel white matter disease for patient age. 2. No fracture or static subluxation of the cervical spine proper. 3. Mildly displaced spinous process fracture of T2 and wedge deformity of T3, described on separately reported examination of the chest, abdomen, and pelvis.  Assessment: 1. ***  Plan: -Consider ERCP?   Thank you for the courtesy of this consult. Please call me with any questions or concerns.   Sheana Bir, FNP-C [facility] 03/04/2023, 10:55 AM  Cc: Rodrigo Ran, MD

## 2023-03-05 ENCOUNTER — Ambulatory Visit (INDEPENDENT_AMBULATORY_CARE_PROVIDER_SITE_OTHER): Payer: Medicare Other | Admitting: Gastroenterology

## 2023-03-05 ENCOUNTER — Encounter: Payer: Self-pay | Admitting: Gastroenterology

## 2023-03-05 VITALS — BP 166/86 | HR 65 | Ht 64.0 in | Wt 114.0 lb

## 2023-03-05 DIAGNOSIS — R932 Abnormal findings on diagnostic imaging of liver and biliary tract: Secondary | ICD-10-CM

## 2023-03-05 DIAGNOSIS — K838 Other specified diseases of biliary tract: Secondary | ICD-10-CM | POA: Diagnosis not present

## 2023-03-05 DIAGNOSIS — R978 Other abnormal tumor markers: Secondary | ICD-10-CM | POA: Diagnosis not present

## 2023-03-05 DIAGNOSIS — R9389 Abnormal findings on diagnostic imaging of other specified body structures: Secondary | ICD-10-CM

## 2023-03-05 NOTE — Patient Instructions (Signed)
_______________________________________________________  If your blood pressure at your visit was 140/90 or greater, please contact your primary care physician to follow up on this.  _______________________________________________________  If you are age 88 or older, your body mass index should be between 23-30. Your Body mass index is 19.57 kg/m. If this is out of the aforementioned range listed, please consider follow up with your Primary Care Provider.  If you are age 35 or younger, your body mass index should be between 19-25. Your Body mass index is 19.57 kg/m. If this is out of the aformentioned range listed, please consider follow up with your Primary Care Provider.   ________________________________________________________  The [facility] providers would like to encourage you to use [facility] to communicate with providers for non-urgent requests or questions.  Due to long hold times on the telephone, sending your provider a message by [facility] may be a faster and more efficient way to get a response.  Please allow 48 business hours for a response.  Please remember that this is for non-urgent requests.  _______________________________________________________  Please call us next week to see what you have decided regarding the EUS.  It was a pleasure to see you today!  Thank you for trusting me with your gastrointestinal care!

## 2023-03-06 NOTE — Progress Notes (Signed)
Reviewed.  I would NOT pursue further workup of this radiologic abnormality in a 89 year old with no referable symptoms and normal liver tests. Expectant management only is quite appropriate. Address problems if they arise. Dr. Marina Goodell

## 2023-03-06 NOTE — Progress Notes (Signed)
This is a patient of Dr. Lamar Sprinkles, so he should also be involved with final decisions. I agree that the imaging is abnormal currently, but she has normal LFTs with only a slightly elevated CA19-9. At her age, to put her through an EUS +/- ERCP when it is unlikely that she would have any overt therapies performed seems to be signfificant. With that being said, if the risks of EUS are understood and patient is adamant that she would want to know if there is something there, then an EUS can be pursued.   However, it must be noted that with the region that is concerning, one would have to consider an ERCP to really understand the CHD and intrahepatics region and the risks of ERCP would also need to be considered. The other alternative, is that since, this was performed in the setting of her recent fall/trauma, then I would consider, if she could have received any opioid therapy at the time and gotten some dilation of the biliary tree as a result (less likely but not impossible if spasm occurred); one could repeat a CT-Liver protocol in 6-weeks (since she cannot have a MRI/MRCP). She may also elect as noted in chart to not have procedure performed. Please keep me UTD with final thoughts from Dr. Marina Goodell and from the patient as well, then we can see what to do in regards to possible scheduling of a procedure.

## 2023-03-07 ENCOUNTER — Ambulatory Visit (INDEPENDENT_AMBULATORY_CARE_PROVIDER_SITE_OTHER): Payer: Medicare Other

## 2023-03-07 DIAGNOSIS — I495 Sick sinus syndrome: Secondary | ICD-10-CM

## 2023-03-12 LAB — CUP PACEART REMOTE DEVICE CHECK
Battery Impedance: 1736 Ohm
Battery Remaining Longevity: 41 mo
Battery Voltage: 2.76 V
Brady Statistic AP VP Percent: 0 %
Brady Statistic AP VS Percent: 99 %
Brady Statistic AS VP Percent: 0 %
Brady Statistic AS VS Percent: 0 %
Date Time Interrogation Session: 20250224144138
Implantable Lead Connection Status: 753985
Implantable Lead Connection Status: 753985
Implantable Lead Implant Date: 20050902
Implantable Lead Implant Date: 20060208
Implantable Lead Location: 753859
Implantable Lead Location: 753860
Implantable Lead Model: 4469
Implantable Lead Model: 4470
Implantable Lead Serial Number: 4470032319
Implantable Lead Serial Number: 449276
Implantable Pulse Generator Implant Date: 20150126
Lead Channel Impedance Value: 374 Ohm
Lead Channel Impedance Value: 608 Ohm
Lead Channel Pacing Threshold Amplitude: 0.625 V
Lead Channel Pacing Threshold Amplitude: 0.75 V
Lead Channel Pacing Threshold Pulse Width: 0.4 ms
Lead Channel Pacing Threshold Pulse Width: 0.4 ms
Lead Channel Setting Pacing Amplitude: 2 V
Lead Channel Setting Pacing Amplitude: 2.5 V
Lead Channel Setting Pacing Pulse Width: 0.4 ms
Lead Channel Setting Sensing Sensitivity: 2.8 mV
Zone Setting Status: 755011
Zone Setting Status: 755011

## 2023-03-13 DIAGNOSIS — K080 Exfoliation of teeth due to systemic causes: Secondary | ICD-10-CM | POA: Diagnosis not present

## 2023-03-19 ENCOUNTER — Encounter: Payer: Self-pay | Admitting: Medical Oncology

## 2023-03-19 NOTE — Progress Notes (Signed)
 [facility]  [facility] with patient regarding appt with [facility] and her decision on an EUS.  My direct call back number provided.   Rexene Edison, RN, BSN, [facility] Oncology Nurse Navigator, [facility] 03/19/2023 2:36 PM

## 2023-04-04 ENCOUNTER — Encounter: Payer: Self-pay | Admitting: Medical Oncology

## 2023-04-10 NOTE — Addendum Note (Signed)
 Addended by: Elease Etienne A on: 04/10/2023 10:25 AM   Modules accepted: Orders

## 2023-04-10 NOTE — Progress Notes (Signed)
 Remote pacemaker transmission.

## 2023-05-15 DIAGNOSIS — M19012 Primary osteoarthritis, left shoulder: Secondary | ICD-10-CM | POA: Diagnosis not present

## 2023-05-15 DIAGNOSIS — M19011 Primary osteoarthritis, right shoulder: Secondary | ICD-10-CM | POA: Diagnosis not present

## 2023-06-06 ENCOUNTER — Ambulatory Visit (INDEPENDENT_AMBULATORY_CARE_PROVIDER_SITE_OTHER): Payer: Medicare Other

## 2023-06-06 DIAGNOSIS — I495 Sick sinus syndrome: Secondary | ICD-10-CM | POA: Diagnosis not present

## 2023-06-11 LAB — CUP PACEART REMOTE DEVICE CHECK
Battery Impedance: 1835 Ohm
Battery Remaining Longevity: 40 mo
Battery Voltage: 2.76 V
Brady Statistic AP VP Percent: 0 %
Brady Statistic AP VS Percent: 99 %
Brady Statistic AS VP Percent: 0 %
Brady Statistic AS VS Percent: 0 %
Date Time Interrogation Session: 20250527103736
Implantable Lead Connection Status: 753985
Implantable Lead Connection Status: 753985
Implantable Lead Implant Date: 20050902
Implantable Lead Implant Date: 20060208
Implantable Lead Location: 753859
Implantable Lead Location: 753860
Implantable Lead Model: 4469
Implantable Lead Model: 4470
Implantable Lead Serial Number: 4470032319
Implantable Lead Serial Number: 449276
Implantable Pulse Generator Implant Date: 20150126
Lead Channel Impedance Value: 388 Ohm
Lead Channel Impedance Value: 668 Ohm
Lead Channel Pacing Threshold Amplitude: 0.75 V
Lead Channel Pacing Threshold Amplitude: 0.75 V
Lead Channel Pacing Threshold Pulse Width: 0.4 ms
Lead Channel Pacing Threshold Pulse Width: 0.4 ms
Lead Channel Setting Pacing Amplitude: 2 V
Lead Channel Setting Pacing Amplitude: 2.5 V
Lead Channel Setting Pacing Pulse Width: 0.4 ms
Lead Channel Setting Sensing Sensitivity: 2.8 mV
Zone Setting Status: 755011
Zone Setting Status: 755011

## 2023-06-12 ENCOUNTER — Ambulatory Visit: Payer: Self-pay | Admitting: Cardiology

## 2023-07-22 ENCOUNTER — Telehealth (HOSPITAL_COMMUNITY): Payer: Self-pay

## 2023-07-22 NOTE — Telephone Encounter (Signed)
 Auth Submission: NO AUTH NEEDED Site of care: Site of care: [facility] Payer: BCBS Medicare Medication & CPT/J Code(s) submitted: Prolia  (Denosumab ) [id number] Diagnosis Code:  Route of submission (phone, fax, portal):  Phone # Fax # Auth type: Buy/Bill HB Units/visits requested: 60mg  q44months Reference number:  Approval from: 07/22/23 to 01/15/24

## 2023-07-24 NOTE — Progress Notes (Signed)
 Remote pacemaker transmission.

## 2023-07-24 NOTE — Addendum Note (Signed)
 Addended by: VICCI SELLER A on: 07/24/2023 09:26 AM   Modules accepted: Orders

## 2023-08-13 DIAGNOSIS — M4692 Unspecified inflammatory spondylopathy, cervical region: Secondary | ICD-10-CM | POA: Diagnosis not present

## 2023-08-14 DIAGNOSIS — M19012 Primary osteoarthritis, left shoulder: Secondary | ICD-10-CM | POA: Diagnosis not present

## 2023-08-14 DIAGNOSIS — M19011 Primary osteoarthritis, right shoulder: Secondary | ICD-10-CM | POA: Diagnosis not present

## 2023-08-20 DIAGNOSIS — E7849 Other hyperlipidemia: Secondary | ICD-10-CM | POA: Diagnosis not present

## 2023-08-20 DIAGNOSIS — D649 Anemia, unspecified: Secondary | ICD-10-CM | POA: Diagnosis not present

## 2023-08-20 DIAGNOSIS — M810 Age-related osteoporosis without current pathological fracture: Secondary | ICD-10-CM | POA: Diagnosis not present

## 2023-08-20 DIAGNOSIS — E039 Hypothyroidism, unspecified: Secondary | ICD-10-CM | POA: Diagnosis not present

## 2023-08-27 DIAGNOSIS — I700 Atherosclerosis of aorta: Secondary | ICD-10-CM | POA: Diagnosis not present

## 2023-08-27 DIAGNOSIS — Z0000 Encounter for general adult medical examination without abnormal findings: Secondary | ICD-10-CM | POA: Diagnosis not present

## 2023-08-27 DIAGNOSIS — R82998 Other abnormal findings in urine: Secondary | ICD-10-CM | POA: Diagnosis not present

## 2023-08-30 ENCOUNTER — Other Ambulatory Visit (HOSPITAL_COMMUNITY): Payer: Self-pay | Admitting: *Deleted

## 2023-09-02 ENCOUNTER — Ambulatory Visit (HOSPITAL_COMMUNITY)
Admission: RE | Admit: 2023-09-02 | Discharge: 2023-09-02 | Disposition: A | Discharge status: Home / Self Care | Source: Ambulatory Visit | Attending: Internal Medicine | Admitting: Internal Medicine

## 2023-09-02 DIAGNOSIS — M810 Age-related osteoporosis without current pathological fracture: Secondary | ICD-10-CM | POA: Insufficient documentation

## 2023-09-02 MED ORDER — DENOSUMAB 60 MG/ML ~~LOC~~ SOSY
60.0000 mg | PREFILLED_SYRINGE | Freq: Once | SUBCUTANEOUS | Status: AC
  Administered 2023-09-02: 60 mg via SUBCUTANEOUS

## 2023-09-02 MED ORDER — DENOSUMAB 60 MG/ML ~~LOC~~ SOSY
PREFILLED_SYRINGE | SUBCUTANEOUS | Status: AC
  Filled 2023-09-02: qty 1

## 2023-09-05 ENCOUNTER — Encounter

## 2023-09-10 ENCOUNTER — Ambulatory Visit (INDEPENDENT_AMBULATORY_CARE_PROVIDER_SITE_OTHER)

## 2023-09-10 DIAGNOSIS — I495 Sick sinus syndrome: Secondary | ICD-10-CM

## 2023-09-11 LAB — CUP PACEART REMOTE DEVICE CHECK
Battery Impedance: 1845 Ohm
Battery Remaining Longevity: 39 mo
Battery Voltage: 2.75 V
Brady Statistic AP VP Percent: 0 %
Brady Statistic AP VS Percent: 99 %
Brady Statistic AS VP Percent: 0 %
Brady Statistic AS VS Percent: 0 %
Date Time Interrogation Session: 20250826133108
Implantable Lead Connection Status: 753985
Implantable Lead Connection Status: 753985
Implantable Lead Implant Date: 20050902
Implantable Lead Implant Date: 20060208
Implantable Lead Location: 753859
Implantable Lead Location: 753860
Implantable Lead Model: 4469
Implantable Lead Model: 4470
Implantable Lead Serial Number: 4470032319
Implantable Lead Serial Number: 449276
Implantable Pulse Generator Implant Date: 20150126
Lead Channel Impedance Value: 354 Ohm
Lead Channel Impedance Value: 654 Ohm
Lead Channel Pacing Threshold Amplitude: 0.75 V
Lead Channel Pacing Threshold Amplitude: 1 V
Lead Channel Pacing Threshold Pulse Width: 0.4 ms
Lead Channel Pacing Threshold Pulse Width: 0.4 ms
Lead Channel Setting Pacing Amplitude: 2 V
Lead Channel Setting Pacing Amplitude: 2.5 V
Lead Channel Setting Pacing Pulse Width: 0.4 ms
Lead Channel Setting Sensing Sensitivity: 2 mV
Zone Setting Status: 755011
Zone Setting Status: 755011

## 2023-09-13 ENCOUNTER — Ambulatory Visit: Payer: Self-pay | Admitting: Cardiology

## 2023-09-17 DIAGNOSIS — R7401 Elevation of levels of liver transaminase levels: Secondary | ICD-10-CM | POA: Diagnosis not present

## 2023-10-01 NOTE — Progress Notes (Signed)
 Remote PPM Transmission

## 2023-10-21 ENCOUNTER — Other Ambulatory Visit (HOSPITAL_COMMUNITY): Payer: Self-pay | Admitting: Internal Medicine

## 2023-10-21 ENCOUNTER — Telehealth (HOSPITAL_COMMUNITY): Payer: Self-pay | Admitting: Internal Medicine

## 2023-11-13 DIAGNOSIS — M19011 Primary osteoarthritis, right shoulder: Secondary | ICD-10-CM | POA: Diagnosis not present

## 2023-11-13 DIAGNOSIS — M19012 Primary osteoarthritis, left shoulder: Secondary | ICD-10-CM | POA: Diagnosis not present

## 2023-12-03 DIAGNOSIS — K080 Exfoliation of teeth due to systemic causes: Secondary | ICD-10-CM | POA: Diagnosis not present

## 2023-12-04 DIAGNOSIS — H04123 Dry eye syndrome of bilateral lacrimal glands: Secondary | ICD-10-CM | POA: Diagnosis not present

## 2023-12-04 DIAGNOSIS — H353132 Nonexudative age-related macular degeneration, bilateral, intermediate dry stage: Secondary | ICD-10-CM | POA: Diagnosis not present

## 2023-12-04 DIAGNOSIS — N471 Phimosis: Secondary | ICD-10-CM | POA: Diagnosis not present

## 2023-12-04 DIAGNOSIS — D23122 Other benign neoplasm of skin of left lower eyelid, including canthus: Secondary | ICD-10-CM | POA: Diagnosis not present

## 2023-12-05 ENCOUNTER — Encounter

## 2023-12-05 ENCOUNTER — Encounter: Payer: Self-pay | Admitting: Oncology

## 2023-12-10 ENCOUNTER — Encounter

## 2024-01-01 DIAGNOSIS — K080 Exfoliation of teeth due to systemic causes: Secondary | ICD-10-CM | POA: Diagnosis not present

## 2024-02-04 ENCOUNTER — Other Ambulatory Visit (HOSPITAL_COMMUNITY): Payer: Self-pay | Admitting: Internal Medicine

## 2024-02-04 ENCOUNTER — Telehealth (HOSPITAL_COMMUNITY): Payer: Self-pay | Admitting: Pharmacy Technician

## 2024-02-04 NOTE — Telephone Encounter (Signed)
 Auth Submission: NO AUTH NEEDED Site of care: [facility] Payer: BCBS MEDICARE, [employer] SHP (RETIREE) Medication & CPT/J Code(s) submitted: Stuboclo (denosumab -bmwo) [id number] Diagnosis Code: M81.0 Route of submission (phone, fax, portal):  Phone # Fax # Auth type: Buy/Bill HB Units/visits requested: 60mg  x 2 doses, q 6 months Reference number:  Approval from: 02/04/2024 to 01/14/25    Dagoberto Armour, CPhT [facility] Phone: [phone] 02/04/2024

## 2024-02-14 ENCOUNTER — Ambulatory Visit: Attending: Cardiology

## 2024-02-14 DIAGNOSIS — I495 Sick sinus syndrome: Secondary | ICD-10-CM

## 2024-02-18 LAB — CUP PACEART REMOTE DEVICE CHECK
Battery Impedance: 1789 Ohm
Battery Remaining Longevity: 40 mo
Battery Voltage: 2.75 V
Brady Statistic AP VP Percent: 0 %
Brady Statistic AP VS Percent: 99 %
Brady Statistic AS VP Percent: 0 %
Brady Statistic AS VS Percent: 0 %
Date Time Interrogation Session: 20260130132009
Implantable Lead Connection Status: 753985
Implantable Lead Connection Status: 753985
Implantable Lead Implant Date: 20050902
Implantable Lead Implant Date: 20060208
Implantable Lead Location: 753859
Implantable Lead Location: 753860
Implantable Lead Model: 4469
Implantable Lead Model: 4470
Implantable Lead Serial Number: 4470032319
Implantable Lead Serial Number: 449276
Implantable Pulse Generator Implant Date: 20150126
Lead Channel Impedance Value: 346 Ohm
Lead Channel Impedance Value: 564 Ohm
Lead Channel Pacing Threshold Amplitude: 0.625 V
Lead Channel Pacing Threshold Amplitude: 1 V
Lead Channel Pacing Threshold Pulse Width: 0.4 ms
Lead Channel Pacing Threshold Pulse Width: 0.4 ms
Lead Channel Setting Pacing Amplitude: 2 V
Lead Channel Setting Pacing Amplitude: 2.5 V
Lead Channel Setting Pacing Pulse Width: 0.4 ms
Lead Channel Setting Sensing Sensitivity: 2 mV
Zone Setting Status: 755011
Zone Setting Status: 755011

## 2024-02-21 NOTE — Progress Notes (Signed)
 Remote PPM Transmission

## 2024-03-05 ENCOUNTER — Inpatient Hospital Stay (HOSPITAL_COMMUNITY): Admission: RE | Admit: 2024-03-05 | Source: Ambulatory Visit

## 2024-03-05 ENCOUNTER — Encounter

## 2024-03-10 ENCOUNTER — Encounter

## 2024-05-15 ENCOUNTER — Ambulatory Visit

## 2024-06-04 ENCOUNTER — Encounter

## 2024-06-09 ENCOUNTER — Encounter

## 2024-09-03 ENCOUNTER — Encounter
